# Patient Record
Sex: Male | Born: 1935 | Race: White | Hispanic: No | Marital: Married | Smoking: Former smoker
Health system: Southern US, Community
[De-identification: ages and names within clinical notes are randomized; demographics above are authoritative.]

## PROBLEM LIST (undated history)

## (undated) DIAGNOSIS — N400 Enlarged prostate without lower urinary tract symptoms: Secondary | ICD-10-CM

## (undated) DIAGNOSIS — N189 Chronic kidney disease, unspecified: Secondary | ICD-10-CM

## (undated) DIAGNOSIS — G608 Other hereditary and idiopathic neuropathies: Secondary | ICD-10-CM

## (undated) DIAGNOSIS — L02619 Cutaneous abscess of unspecified foot: Secondary | ICD-10-CM

## (undated) DIAGNOSIS — L89899 Pressure ulcer of other site, unspecified stage: Secondary | ICD-10-CM

## (undated) DIAGNOSIS — I452 Bifascicular block: Secondary | ICD-10-CM

## (undated) DIAGNOSIS — G20 Parkinson's disease: Secondary | ICD-10-CM

## (undated) DIAGNOSIS — I509 Heart failure, unspecified: Secondary | ICD-10-CM

## (undated) DIAGNOSIS — J0100 Acute maxillary sinusitis, unspecified: Secondary | ICD-10-CM

## (undated) DIAGNOSIS — Z79899 Other long term (current) drug therapy: Secondary | ICD-10-CM

## (undated) DIAGNOSIS — L2089 Other atopic dermatitis: Secondary | ICD-10-CM

## (undated) DIAGNOSIS — I2510 Atherosclerotic heart disease of native coronary artery without angina pectoris: Secondary | ICD-10-CM

## (undated) DIAGNOSIS — L97909 Non-pressure chronic ulcer of unspecified part of unspecified lower leg with unspecified severity: Secondary | ICD-10-CM

## (undated) DIAGNOSIS — E119 Type 2 diabetes mellitus without complications: Secondary | ICD-10-CM

## (undated) DIAGNOSIS — L03119 Cellulitis of unspecified part of limb: Secondary | ICD-10-CM

## (undated) DIAGNOSIS — M542 Cervicalgia: Secondary | ICD-10-CM

## (undated) DIAGNOSIS — L219 Seborrheic dermatitis, unspecified: Secondary | ICD-10-CM

## (undated) DIAGNOSIS — G4733 Obstructive sleep apnea (adult) (pediatric): Secondary | ICD-10-CM

## (undated) DIAGNOSIS — G250 Essential tremor: Secondary | ICD-10-CM

## (undated) DIAGNOSIS — R350 Frequency of micturition: Secondary | ICD-10-CM

## (undated) DIAGNOSIS — R609 Edema, unspecified: Secondary | ICD-10-CM

## (undated) DIAGNOSIS — I739 Peripheral vascular disease, unspecified: Secondary | ICD-10-CM

## (undated) DIAGNOSIS — N529 Male erectile dysfunction, unspecified: Secondary | ICD-10-CM

## (undated) DIAGNOSIS — I10 Essential (primary) hypertension: Secondary | ICD-10-CM

## (undated) DIAGNOSIS — I4891 Unspecified atrial fibrillation: Secondary | ICD-10-CM

## (undated) DIAGNOSIS — G252 Other specified forms of tremor: Secondary | ICD-10-CM

## (undated) DIAGNOSIS — I517 Cardiomegaly: Secondary | ICD-10-CM

## (undated) DIAGNOSIS — L97509 Non-pressure chronic ulcer of other part of unspecified foot with unspecified severity: Secondary | ICD-10-CM

## (undated) DIAGNOSIS — J209 Acute bronchitis, unspecified: Secondary | ICD-10-CM

## (undated) DIAGNOSIS — G20A1 Parkinson's disease without dyskinesia, without mention of fluctuations: Secondary | ICD-10-CM

## (undated) DIAGNOSIS — E785 Hyperlipidemia, unspecified: Secondary | ICD-10-CM

## (undated) DIAGNOSIS — R55 Syncope and collapse: Secondary | ICD-10-CM

## (undated) HISTORY — DX: Non-pressure chronic ulcer of unspecified part of unspecified lower leg with unspecified severity: L97.909

## (undated) HISTORY — DX: Acute bronchitis, unspecified: J20.9

## (undated) HISTORY — DX: Type 2 diabetes mellitus without complications: E11.9

## (undated) HISTORY — DX: Obstructive sleep apnea (adult) (pediatric): G47.33

## (undated) HISTORY — DX: Cutaneous abscess of unspecified foot: L02.619

## (undated) HISTORY — DX: Bifascicular block: I45.2

## (undated) HISTORY — DX: Male erectile dysfunction, unspecified: N52.9

## (undated) HISTORY — DX: Non-pressure chronic ulcer of other part of unspecified foot with unspecified severity: L97.509

## (undated) HISTORY — DX: Hyperlipidemia, unspecified: E78.5

## (undated) HISTORY — DX: Seborrheic dermatitis, unspecified: L21.9

## (undated) HISTORY — DX: Other specified forms of tremor: G25.2

## (undated) HISTORY — DX: Cardiomegaly: I51.7

## (undated) HISTORY — DX: Other hereditary and idiopathic neuropathies: G60.8

## (undated) HISTORY — DX: Essential (primary) hypertension: I10

## (undated) HISTORY — DX: Cellulitis of unspecified part of limb: L03.119

## (undated) HISTORY — DX: Acute maxillary sinusitis, unspecified: J01.00

## (undated) HISTORY — DX: Essential tremor: G25.0

## (undated) HISTORY — DX: Edema, unspecified: R60.9

## (undated) HISTORY — DX: Cervicalgia: M54.2

## (undated) HISTORY — DX: Unspecified atrial fibrillation: I48.91

## (undated) HISTORY — DX: Syncope and collapse: R55

## (undated) HISTORY — DX: Parkinson's disease: G20

## (undated) HISTORY — DX: Pressure ulcer of other site, unspecified stage: L89.899

## (undated) HISTORY — DX: Peripheral vascular disease, unspecified: I73.9

## (undated) HISTORY — DX: Other long term (current) drug therapy: Z79.899

## (undated) HISTORY — DX: Chronic kidney disease, unspecified: N18.9

## (undated) HISTORY — DX: Parkinson's disease without dyskinesia, without mention of fluctuations: G20.A1

## (undated) HISTORY — DX: Heart failure, unspecified: I50.9

## (undated) HISTORY — DX: Other atopic dermatitis: L20.89

## (undated) HISTORY — DX: Frequency of micturition: R35.0

## (undated) HISTORY — DX: Atherosclerotic heart disease of native coronary artery without angina pectoris: I25.10

---

## 1968-01-10 HISTORY — PX: LUMBAR SPINE SURGERY: SHX701

## 1972-01-10 HISTORY — PX: KNEE SURGERY: SHX244

## 1979-01-10 HISTORY — PX: TONSILLECTOMY: SUR1361

## 1986-01-09 HISTORY — PX: LUMBAR SPINE SURGERY: SHX701

## 1986-01-09 HISTORY — PX: SHOULDER SURGERY: SHX246

## 1986-01-09 HISTORY — PX: CERVICAL SPINE SURGERY: SHX589

## 1989-01-09 HISTORY — PX: BACK SURGERY: SHX140

## 2002-01-09 HISTORY — PX: CERVICAL DISCECTOMY: SHX98

## 2002-05-27 ENCOUNTER — Encounter: Admission: RE | Admit: 2002-05-27 | Discharge: 2002-05-27 | Payer: Self-pay | Admitting: Neurosurgery

## 2002-05-27 ENCOUNTER — Encounter: Payer: Self-pay | Admitting: Neurosurgery

## 2002-06-11 ENCOUNTER — Encounter: Payer: Self-pay | Admitting: Neurosurgery

## 2002-06-16 ENCOUNTER — Encounter: Payer: Self-pay | Admitting: Neurosurgery

## 2002-06-16 ENCOUNTER — Ambulatory Visit (HOSPITAL_COMMUNITY): Admission: RE | Admit: 2002-06-16 | Discharge: 2002-06-16 | Payer: Self-pay | Admitting: Neurosurgery

## 2002-07-08 ENCOUNTER — Encounter: Payer: Self-pay | Admitting: Neurosurgery

## 2002-07-08 ENCOUNTER — Encounter: Admission: RE | Admit: 2002-07-08 | Discharge: 2002-07-08 | Payer: Self-pay | Admitting: Neurosurgery

## 2006-01-09 HISTORY — PX: CATARACT EXTRACTION, BILATERAL: SHX1313

## 2009-06-08 HISTORY — PX: COLONOSCOPY: SHX174

## 2010-03-21 ENCOUNTER — Emergency Department (HOSPITAL_COMMUNITY): Payer: Medicare Other

## 2010-03-21 ENCOUNTER — Emergency Department (HOSPITAL_COMMUNITY)
Admission: EM | Admit: 2010-03-21 | Discharge: 2010-03-22 | Disposition: A | Discharge status: Home / Self Care | Payer: Medicare Other | Attending: Emergency Medicine | Admitting: Emergency Medicine

## 2010-03-21 DIAGNOSIS — G20 Parkinson's disease: Secondary | ICD-10-CM | POA: Insufficient documentation

## 2010-03-21 DIAGNOSIS — R059 Cough, unspecified: Secondary | ICD-10-CM | POA: Insufficient documentation

## 2010-03-21 DIAGNOSIS — G20A1 Parkinson's disease without dyskinesia, without mention of fluctuations: Secondary | ICD-10-CM | POA: Insufficient documentation

## 2010-03-21 DIAGNOSIS — F29 Unspecified psychosis not due to a substance or known physiological condition: Secondary | ICD-10-CM | POA: Insufficient documentation

## 2010-03-21 DIAGNOSIS — R05 Cough: Secondary | ICD-10-CM | POA: Insufficient documentation

## 2010-03-21 DIAGNOSIS — J189 Pneumonia, unspecified organism: Secondary | ICD-10-CM | POA: Insufficient documentation

## 2010-03-21 DIAGNOSIS — I517 Cardiomegaly: Secondary | ICD-10-CM | POA: Insufficient documentation

## 2010-03-22 ENCOUNTER — Encounter (HOSPITAL_COMMUNITY): Payer: Self-pay

## 2010-03-22 LAB — CBC
HCT: 40 % (ref 39.0–52.0)
Hemoglobin: 12.8 g/dL — ABNORMAL LOW (ref 13.0–17.0)
MCH: 28.4 pg (ref 26.0–34.0)
MCHC: 32 g/dL (ref 30.0–36.0)
RDW: 14.2 % (ref 11.5–15.5)

## 2010-03-22 LAB — BASIC METABOLIC PANEL
BUN: 29 mg/dL — ABNORMAL HIGH (ref 6–23)
CO2: 25 mEq/L (ref 19–32)
Calcium: 8.5 mg/dL (ref 8.4–10.5)
Creatinine, Ser: 1.39 mg/dL (ref 0.4–1.5)
GFR calc non Af Amer: 49 mL/min — ABNORMAL LOW (ref >60)
Glucose, Bld: 125 mg/dL — ABNORMAL HIGH (ref 70–99)
Sodium: 135 mEq/L (ref 135–145)

## 2010-03-22 LAB — DIFFERENTIAL
Basophils Absolute: 0 10*3/uL (ref 0.0–0.1)
Basophils Relative: 0 % (ref 0–1)
Eosinophils Relative: 3 % (ref 0–5)
Lymphocytes Relative: 20 % (ref 12–46)
Monocytes Absolute: 1.1 10*3/uL — ABNORMAL HIGH (ref 0.1–1.0)
Monocytes Relative: 13 % — ABNORMAL HIGH (ref 3–12)
Neutro Abs: 5.7 10*3/uL (ref 1.7–7.7)

## 2010-03-22 LAB — URINALYSIS, ROUTINE W REFLEX MICROSCOPIC
Bilirubin Urine: NEGATIVE
Hgb urine dipstick: NEGATIVE
Ketones, ur: NEGATIVE mg/dL
Nitrite: NEGATIVE
Protein, ur: NEGATIVE mg/dL
Urobilinogen, UA: 1 mg/dL (ref 0.0–1.0)

## 2010-12-19 ENCOUNTER — Ambulatory Visit
Admission: RE | Admit: 2010-12-19 | Discharge: 2010-12-19 | Disposition: A | Discharge status: Home / Self Care | Payer: Medicare Other | Source: Ambulatory Visit | Attending: Internal Medicine | Admitting: Internal Medicine

## 2010-12-19 ENCOUNTER — Other Ambulatory Visit (HOSPITAL_BASED_OUTPATIENT_CLINIC_OR_DEPARTMENT_OTHER): Payer: Self-pay | Admitting: Internal Medicine

## 2010-12-19 DIAGNOSIS — L97509 Non-pressure chronic ulcer of other part of unspecified foot with unspecified severity: Secondary | ICD-10-CM

## 2010-12-22 ENCOUNTER — Encounter (HOSPITAL_BASED_OUTPATIENT_CLINIC_OR_DEPARTMENT_OTHER): Payer: Medicare Other | Attending: Internal Medicine

## 2010-12-22 DIAGNOSIS — Z79899 Other long term (current) drug therapy: Secondary | ICD-10-CM | POA: Insufficient documentation

## 2010-12-22 DIAGNOSIS — Z7982 Long term (current) use of aspirin: Secondary | ICD-10-CM | POA: Insufficient documentation

## 2010-12-22 DIAGNOSIS — R7309 Other abnormal glucose: Secondary | ICD-10-CM | POA: Insufficient documentation

## 2010-12-22 DIAGNOSIS — G20A1 Parkinson's disease without dyskinesia, without mention of fluctuations: Secondary | ICD-10-CM | POA: Insufficient documentation

## 2010-12-22 DIAGNOSIS — L97509 Non-pressure chronic ulcer of other part of unspecified foot with unspecified severity: Secondary | ICD-10-CM | POA: Insufficient documentation

## 2010-12-22 DIAGNOSIS — G20 Parkinson's disease: Secondary | ICD-10-CM | POA: Insufficient documentation

## 2010-12-22 NOTE — H&P (Signed)
NAMEMARVELL, Ryan Cunningham NO.:  [id number]  MEDICAL RECORD NO.:  [id number]  LOCATION:  FOOT                         FACILITY:  [facility]  PHYSICIAN:  Ardath Sax, M.D.     DATE OF BIRTH:  1927/08/29  DATE OF ADMISSION:  12/22/2010 DATE OF DISCHARGE:                             HISTORY & PHYSICAL   [name] is an 77 year old gentleman who [facility]st time, sent here by Dr. Leanord Hawking.  Apparently, he is borderline type 2 diabetic.  He is not on any current medicine.  His blood sugar a week ago was 112 and his A1c was 6.4.  He has Parkinson's and he is on Sinemet 10/100, 3 times a day.  He is on simvastatin, Advil, aspirin and he takes Lasix 20 mg a day.  He has had several back surgeries and he has also had a cardiac cath, which apparently was normal.  He comes here because he has what looks exactly like diabetic foot ulcers, 1 on the plantar aspect of his left foot, opposite his 3rd MP joint.  The other is on the medial side of his foot at the 1st MP joint.  He had these x- rayed as an outpatient, which showed no evidence of an osteo.  He was examined today and he is found to have excellent pedal pulses.  He has got a little swelling of the left leg.  There is no hair, but the foot is warm.  He has an ulcer on the medial side of his MP joint that is dirty with necrotic skin and callus.  It is about a cm and a half in diameter.  He has a cleaner looking up ulcer on the plantar aspect of his foot that is about a half a cm in diameter.  I debrided all of these of callus, and we got a culture and his doctor has also already put him on clindamycin 300 mg 3 times a day.  We will check the culture.  In the meantime, I wrote him a prescription for Santyl and we instructed the wife how to change the dressing every other day.  We will see him back here in a week.  This is a foot that if he could tolerate it with his Parkinson's and his weakness, a  total-contact cast may be of value to help heal these wounds.     Ardath Sax, M.D.     PP/MEDQ  D:  12/22/2010  T:  12/22/2010  Job:  [id number]

## 2011-01-12 ENCOUNTER — Encounter (HOSPITAL_BASED_OUTPATIENT_CLINIC_OR_DEPARTMENT_OTHER): Payer: Medicare Other | Attending: Internal Medicine

## 2011-01-12 DIAGNOSIS — L84 Corns and callosities: Secondary | ICD-10-CM | POA: Insufficient documentation

## 2011-01-12 DIAGNOSIS — G20A1 Parkinson's disease without dyskinesia, without mention of fluctuations: Secondary | ICD-10-CM | POA: Insufficient documentation

## 2011-01-12 DIAGNOSIS — E119 Type 2 diabetes mellitus without complications: Secondary | ICD-10-CM | POA: Insufficient documentation

## 2011-01-12 DIAGNOSIS — L97509 Non-pressure chronic ulcer of other part of unspecified foot with unspecified severity: Secondary | ICD-10-CM | POA: Insufficient documentation

## 2011-01-12 DIAGNOSIS — Z79899 Other long term (current) drug therapy: Secondary | ICD-10-CM | POA: Insufficient documentation

## 2011-01-12 DIAGNOSIS — G20 Parkinson's disease: Secondary | ICD-10-CM | POA: Insufficient documentation

## 2011-01-19 ENCOUNTER — Encounter (HOSPITAL_BASED_OUTPATIENT_CLINIC_OR_DEPARTMENT_OTHER): Payer: Medicare Other

## 2011-02-16 ENCOUNTER — Encounter (HOSPITAL_BASED_OUTPATIENT_CLINIC_OR_DEPARTMENT_OTHER): Payer: Medicare Other | Attending: Internal Medicine

## 2011-02-16 DIAGNOSIS — L97509 Non-pressure chronic ulcer of other part of unspecified foot with unspecified severity: Secondary | ICD-10-CM | POA: Insufficient documentation

## 2011-03-16 ENCOUNTER — Encounter (HOSPITAL_BASED_OUTPATIENT_CLINIC_OR_DEPARTMENT_OTHER): Payer: Medicare Other | Attending: Internal Medicine

## 2011-03-16 DIAGNOSIS — L97509 Non-pressure chronic ulcer of other part of unspecified foot with unspecified severity: Secondary | ICD-10-CM | POA: Insufficient documentation

## 2012-01-04 ENCOUNTER — Encounter: Payer: Self-pay | Admitting: Geriatric Medicine

## 2012-03-26 ENCOUNTER — Ambulatory Visit: Payer: Self-pay | Admitting: Internal Medicine

## 2012-04-03 ENCOUNTER — Ambulatory Visit (INDEPENDENT_AMBULATORY_CARE_PROVIDER_SITE_OTHER): Payer: Medicare Other | Admitting: Internal Medicine

## 2012-04-03 ENCOUNTER — Encounter: Payer: Self-pay | Admitting: Internal Medicine

## 2012-04-03 VITALS — BP 138/64 | HR 62 | Temp 97.4°F | Resp 20 | Ht 73.5 in | Wt 216.0 lb

## 2012-04-03 DIAGNOSIS — G20 Parkinson's disease: Secondary | ICD-10-CM

## 2012-04-03 DIAGNOSIS — I10 Essential (primary) hypertension: Secondary | ICD-10-CM

## 2012-04-03 DIAGNOSIS — R001 Bradycardia, unspecified: Secondary | ICD-10-CM | POA: Insufficient documentation

## 2012-04-03 DIAGNOSIS — N189 Chronic kidney disease, unspecified: Secondary | ICD-10-CM

## 2012-04-03 DIAGNOSIS — I498 Other specified cardiac arrhythmias: Secondary | ICD-10-CM

## 2012-04-03 DIAGNOSIS — E785 Hyperlipidemia, unspecified: Secondary | ICD-10-CM | POA: Insufficient documentation

## 2012-04-03 NOTE — Progress Notes (Signed)
  Subjective:    Patient ID: Ryan Cunningham, male    DOB: 05-16-1927, 79 y.o.   MRN: [id number]  Chief Complaint  Patient presents with  . Medical Managment of Chronic Issues  . Tremors   HPI pt seeing dr Leanord Hawking before. Here for his routine follow up. He denies any complaints this visit. No recent falls reported  Tremors under control with current regimen bp remains stable    Review of Systems  Constitutional: Negative for chills and appetite change.  Respiratory: Negative for cough and shortness of breath.   Cardiovascular: Negative for chest pain, palpitations and leg swelling.  Gastrointestinal: Negative for abdominal pain, constipation and abdominal distention.  Genitourinary: Negative for dysuria.  Musculoskeletal: Positive for gait problem.  Neurological: Positive for tremors. Negative for dizziness, syncope and speech difficulty.  Psychiatric/Behavioral: Negative for behavioral problems.       Objective:   Physical Exam  Constitutional: He is oriented to person, place, and time. He appears well-developed and well-nourished. No distress.  HENT:  Head: Normocephalic.  Mouth/Throat: Oropharynx is clear and moist.  Eyes: Conjunctivae and EOM are normal. Pupils are equal, round, and reactive to light.  Neck: Normal range of motion. Neck supple.  Cardiovascular: Normal rate and regular rhythm.   Pulmonary/Chest: Effort normal and breath sounds normal.  Abdominal: Soft. Bowel sounds are normal.  Musculoskeletal: Normal range of motion. He exhibits no edema.  Neurological: He is alert and oriented to person, place, and time.  Skin: Skin is warm and dry.  Venous stasis present  Psychiatric: He has a normal mood and affect.          Assessment & Plan:   HTN (hypertension) bp acceptable this visit. Monitor for now. Currently off all medications.   Parkinson disease On carbidopa-levodopa 10-100 mg 2 tab tid- stable, shuffled gait- using cane. Fall precautions  CKD  (chronic kidney disease) Check renal function prior to next visit  Bradycardia Persists, asymptomatic. Echocardiogram shows good LV function  Other and unspecified hyperlipidemia Continue zocor 40 mg daily and monitor, check flp prior to next visit. Also to continue ASA

## 2012-04-03 NOTE — Assessment & Plan Note (Signed)
On carbidopa-levodopa 10-100 mg 2 tab tid- stable, shuffled gait- using cane. Fall precautions

## 2012-04-03 NOTE — Assessment & Plan Note (Signed)
Check renal function prior to next visit

## 2012-04-03 NOTE — Assessment & Plan Note (Signed)
Persists, asymptomatic. Echocardiogram shows good LV function

## 2012-04-03 NOTE — Assessment & Plan Note (Addendum)
Continue zocor 40 mg daily and monitor, check flp prior to next visit. Also to continue ASA

## 2012-04-03 NOTE — Assessment & Plan Note (Signed)
bp acceptable this visit. Monitor for now. Currently off all medications.

## 2012-07-26 ENCOUNTER — Other Ambulatory Visit: Payer: Medicare Other

## 2012-07-26 ENCOUNTER — Other Ambulatory Visit: Payer: Self-pay | Admitting: *Deleted

## 2012-07-26 DIAGNOSIS — N189 Chronic kidney disease, unspecified: Secondary | ICD-10-CM

## 2012-07-26 DIAGNOSIS — I10 Essential (primary) hypertension: Secondary | ICD-10-CM

## 2012-07-26 DIAGNOSIS — E785 Hyperlipidemia, unspecified: Secondary | ICD-10-CM

## 2012-07-26 DIAGNOSIS — IMO0001 Reserved for inherently not codable concepts without codable children: Secondary | ICD-10-CM

## 2012-07-27 LAB — COMPREHENSIVE METABOLIC PANEL
AST: 29 IU/L (ref 0–40)
Albumin: 4.3 g/dL (ref 3.5–4.7)
Alkaline Phosphatase: 83 IU/L (ref 39–117)
BUN/Creatinine Ratio: 24 — ABNORMAL HIGH (ref 10–22)
BUN: 33 mg/dL — ABNORMAL HIGH (ref 8–27)
CO2: 25 mmol/L (ref 18–29)
Chloride: 102 mmol/L (ref 97–108)
GFR calc Af Amer: 55 mL/min/{1.73_m2} — ABNORMAL LOW (ref >59)
Potassium: 5.2 mmol/L (ref 3.5–5.2)
Sodium: 140 mmol/L (ref 134–144)
Total Bilirubin: 0.8 mg/dL (ref 0.0–1.2)

## 2012-07-27 LAB — HEMOGLOBIN A1C
Est. average glucose Bld gHb Est-mCnc: 134 mg/dL
Hgb A1c MFr Bld: 6.3 % — ABNORMAL HIGH (ref 4.8–5.6)

## 2012-07-27 LAB — CBC WITH DIFFERENTIAL/PLATELET
Basos: 1 % (ref 0–3)
Eosinophils Absolute: 0.1 10*3/uL (ref 0.0–0.4)
HCT: 41.4 % (ref 37.5–51.0)
Hemoglobin: 13.7 g/dL (ref 12.6–17.7)
Lymphocytes Absolute: 1.8 10*3/uL (ref 0.7–3.1)
Lymphs: 27 % (ref 14–46)
MCH: 29.2 pg (ref 26.6–33.0)
Monocytes: 7 % (ref 4–12)
Neutrophils Absolute: 4.2 10*3/uL (ref 1.4–7.0)
RBC: 4.69 x10E6/uL (ref 4.14–5.80)

## 2012-07-27 LAB — LIPID PANEL
Cholesterol, Total: 119 mg/dL (ref 100–199)
LDL Calculated: 61 mg/dL (ref 0–99)
Triglycerides: 79 mg/dL (ref 0–149)

## 2012-07-30 ENCOUNTER — Ambulatory Visit (INDEPENDENT_AMBULATORY_CARE_PROVIDER_SITE_OTHER): Payer: Medicare Other | Admitting: Internal Medicine

## 2012-07-30 ENCOUNTER — Encounter: Payer: Self-pay | Admitting: Internal Medicine

## 2012-07-30 VITALS — BP 122/70 | HR 51 | Temp 97.7°F | Resp 13 | Ht 73.5 in | Wt 208.4 lb

## 2012-07-30 DIAGNOSIS — I10 Essential (primary) hypertension: Secondary | ICD-10-CM

## 2012-07-30 DIAGNOSIS — R7309 Other abnormal glucose: Secondary | ICD-10-CM

## 2012-07-30 DIAGNOSIS — R001 Bradycardia, unspecified: Secondary | ICD-10-CM

## 2012-07-30 DIAGNOSIS — N183 Chronic kidney disease, stage 3 (moderate): Secondary | ICD-10-CM

## 2012-07-30 DIAGNOSIS — I498 Other specified cardiac arrhythmias: Secondary | ICD-10-CM

## 2012-07-30 DIAGNOSIS — G20 Parkinson's disease: Secondary | ICD-10-CM

## 2012-07-30 DIAGNOSIS — R7303 Prediabetes: Secondary | ICD-10-CM

## 2012-07-30 DIAGNOSIS — E785 Hyperlipidemia, unspecified: Secondary | ICD-10-CM

## 2012-07-30 MED ORDER — CARBIDOPA-LEVODOPA 10-100 MG PO TABS: 2.0000 | ORAL_TABLET | Freq: Three times a day (TID) | ORAL | Status: DC

## 2012-07-30 MED ORDER — SIMVASTATIN 80 MG PO TABS: ORAL_TABLET | ORAL | Status: DC

## 2012-07-30 NOTE — Progress Notes (Signed)
Patient ID: Ryan Cunningham, male   DOB: September 26, 1927, 79 y.o.   MRN: [id number]  Chief Complaint  Patient presents with  . Medical Managment of Chronic Issues   HPI  Here for his routine follow up. He denies any complaints this visit. No recent falls reported. Using his cane Tremors under control with current regimen bp remains stable  Review of Systems  Constitutional: Negative for chills, fever and appetite change. Has hearing aid Respiratory: Negative for cough and shortness of breath.   Cardiovascular: Negative for chest pain, palpitations and leg swelling.  Gastrointestinal: Negative for abdominal pain, constipation and abdominal distention.  Genitourinary: Negative for dysuria.  Musculoskeletal: Positive for gait problem. Uses a cane Neurological: Positive for tremors. Negative for dizziness, syncope and speech difficulty.  Psychiatric/Behavioral: Negative for behavioral problems.   Allergies  Allergen Reactions  . Penicillins    Reviewed medications  PHYSICAL EXAM  BP 122/70  Pulse 51  Temp(Src) 97.7 F (36.5 C) (Oral)  Resp 13  Ht 6' 1.5" (1.867 m)  Wt 208 lb 6.4 oz (94.53 kg)  BMI 27.12 kg/m2  Constitutional: He is oriented to person, place, and time. He appears well-developed and well-nourished. No distress.  HENT:   Head: Normocephalic.   Mouth/Throat: Oropharynx is clear and moist.  Eyes: Conjunctivae and EOM are normal. Pupils are equal, round, and reactive to light.  Neck: Normal range of motion. Neck supple.  Cardiovascular: Normal rate and regular rhythm.   Pulmonary/Chest: Effort normal and breath sounds normal.  Abdominal: Soft. Bowel sounds are normal.  Musculoskeletal: Normal range of motion. He exhibits no edema.  Neurological: He is alert and oriented to person, place, and time.  Skin: Skin is warm and dry.  Venous stasis present  Psychiatric: He has a normal mood and affect.   LABS REVIEWED  CBC    Component Value Date/Time   WBC 6.6  07/26/2012 0846   WBC 8.9 03/21/2010 2358   RBC 4.69 07/26/2012 0846   RBC 4.50 03/21/2010 2358   HGB 13.7 07/26/2012 0846   HCT 41.4 07/26/2012 0846   PLT 281 03/21/2010 2358   MCV 88 07/26/2012 0846   MCH 29.2 07/26/2012 0846   MCH 28.4 03/21/2010 2358   MCHC 33.1 07/26/2012 0846   MCHC 32.0 03/21/2010 2358   RDW 14.4 07/26/2012 0846   RDW 14.2 03/21/2010 2358   LYMPHSABS 1.8 07/26/2012 0846   LYMPHSABS 1.8 03/21/2010 2358   MONOABS 1.1* 03/21/2010 2358   EOSABS 0.1 07/26/2012 0846   EOSABS 0.3 03/21/2010 2358   BASOSABS 0.1 07/26/2012 0846   BASOSABS 0.0 03/21/2010 2358    CMP     Component Value Date/Time   NA 140 07/26/2012 0846   NA 135 03/21/2010 2358   K 5.2 07/26/2012 0846   CL 102 07/26/2012 0846   CO2 25 07/26/2012 0846   GLUCOSE 118* 07/26/2012 0846   GLUCOSE 125* 03/21/2010 2358   BUN 33* 07/26/2012 0846   BUN 29* 03/21/2010 2358   CREATININE 1.36* 07/26/2012 0846   CALCIUM 9.7 07/26/2012 0846   PROT 7.5 07/26/2012 0846   AST 29 07/26/2012 0846   ALT 30 07/26/2012 0846   ALKPHOS 83 07/26/2012 0846   BILITOT 0.8 07/26/2012 0846   GFRNONAA 48* 07/26/2012 0846   GFRAA 55* 07/26/2012 0846   Lipid Panel     Component Value Date/Time   TRIG 79 07/26/2012 0846   HDL 42 07/26/2012 0846   CHOLHDL 2.8 07/26/2012 0846   LDLCALC 61 07/26/2012  2841   a1c 6.3  ASSESSMENT/PLAN-  HTN (hypertension) bp normal this visit. Monitor for now. Currently off all medications.   Other and unspecified hyperlipidemia Continue zocor 40 mg daily and continue ASA. Reviewed flp  Parkinson disease On carbidopa-levodopa 10-100 mg 2 tab tid- stable, shuffled gait- using cane. Fall precautions. Refills provided  CKD (chronic kidney disease) gfr of 48 s/o ckd stage 3. Avoid NSAIDs  Bradycardia Persists, asymptomatic. Echocardiogram shows good LV function  Prediabetes on asa and statin. bp controlled. Monitor clinically for now

## 2012-08-20 ENCOUNTER — Encounter: Payer: Self-pay | Admitting: Nurse Practitioner

## 2012-08-20 ENCOUNTER — Ambulatory Visit (INDEPENDENT_AMBULATORY_CARE_PROVIDER_SITE_OTHER): Payer: Medicare Other | Admitting: Nurse Practitioner

## 2012-08-20 VITALS — BP 100/58 | HR 86 | Temp 97.6°F | Resp 18 | Wt 206.9 lb

## 2012-08-20 DIAGNOSIS — T7840XA Allergy, unspecified, initial encounter: Secondary | ICD-10-CM

## 2012-08-20 MED ORDER — HYDROXYZINE HCL 25 MG PO TABS: 25.0000 mg | ORAL_TABLET | Freq: Three times a day (TID) | ORAL | Status: DC | PRN

## 2012-08-20 MED ORDER — PREDNISONE (PAK) 10 MG PO TABS: ORAL_TABLET | ORAL | Status: DC

## 2012-08-20 NOTE — Progress Notes (Signed)
Patient ID: Ryan Cunningham, male   DOB: 14-Nov-1927, 79 y.o.   MRN: [id number]   Allergies  Allergen Reactions  . Penicillins     Chief Complaint  Patient presents with  . Acute Visit    woke up with face swelling on the left side, rash on arms and hands w/itching    HPI: Patient is a 79 y.o. male seen in the office today for a reaction to possible wasp Yesterday he was outside picking up a bird house and a swarm of wasp came out of it. Pt was trying to kill them with a spray. He was fine when he went to bed but when he woke up this morning his left side of face is swollen, rash on both arms, both legs, chest and back. Reports it is itching.  Woke up at 4 am and could not close his mouth and had trouble catching his breath due to the shock of all the swelling (this resolved quickly).  No shortness of breath or trouble breathing, no trouble swallowing. No swelling of tongue or inside mouth Review of Systems:   Review of Systems  Constitutional: Negative for fever, chills and malaise/fatigue.  Respiratory: Negative for cough, shortness of breath and wheezing.   Cardiovascular: Negative for chest pain and palpitations.  Gastrointestinal: Negative for abdominal pain, diarrhea and constipation.  Genitourinary: Negative for dysuria, urgency and frequency.  Musculoskeletal: Negative for myalgias.  Skin: Positive for itching and rash.  Neurological: Negative for dizziness, weakness and headaches.      Past Medical History  Diagnosis Date  . Other specified idiopathic peripheral neuropathy   . Atrial fibrillation   . Congestive heart failure, unspecified   . Ulcer of lower limb, unspecified   . Ulcer of other part of foot   . Cellulitis and abscess of foot, except toes   . Pressure ulcer, other site(707.09)   . Acute bronchitis   . Acute bronchitis   . Obstructive sleep apnea (adult) (pediatric)   . Edema   . Acute maxillary sinusitis   . Chronic kidney disease, unspecified   .  Urinary frequency   . Other atopic dermatitis and related conditions   . Syncope and collapse   . Encounter for long-term (current) use of other medications   . Other and unspecified hyperlipidemia   . Essential and other specified forms of tremor   . Coronary atherosclerosis of unspecified type of vessel, native or graft   . Cervicalgia   . Type II or unspecified type diabetes mellitus without mention of complication, not stated as uncontrolled   . Unspecified essential hypertension   . Seborrhea   . Paralysis agitans   . Right bundle branch block and left anterior fascicular block   . Cardiomegaly   . Peripheral vascular disease, unspecified   . Impotence of organic origin    History reviewed. No pertinent past surgical history. Social History:   reports that he has quit smoking. His smoking use included Cigarettes. He smoked 0.00 packs per day. He does not have any smokeless tobacco history on file. He reports that he does not drink alcohol or use illicit drugs.  Family History  Problem Relation Age of Onset  . Heart disease Father     CVA  . Heart disease Brother   . Heart disease Brother   . Heart disease Brother     MI  . Cancer Brother     Medications: Patient's Medications  New Prescriptions   No medications on  file  Previous Medications   ASPIRIN 81 MG TABLET    Take 81 mg by mouth daily.   CARBIDOPA-LEVODOPA (SINEMET IR) 10-100 MG PER TABLET    Take 2 tablets by mouth 3 (three) times daily. To help tremor.   SIMVASTATIN (ZOCOR) 80 MG TABLET    Take one half tablet by mouth daily to lower cholesterol.  Modified Medications   No medications on file  Discontinued Medications   No medications on file     Physical Exam:  Filed Vitals:   08/20/12 1002  BP: 100/58  Pulse: 86  Temp: 97.6 F (36.4 C)  TempSrc: Oral  Resp: 18  Weight: 206 lb 14.4 oz (93.849 kg)  SpO2: 95%    Physical Exam  Constitutional: He is oriented to person, place, and time and  well-developed, well-nourished, and in no distress. No distress.  HENT:  Head: Normocephalic and atraumatic.  Nose: Nose normal.  Mouth/Throat: Oropharynx is clear and moist. No oropharyngeal exudate, posterior oropharyngeal edema or posterior oropharyngeal erythema.  Pulmonary/Chest: Effort normal and breath sounds normal. No respiratory distress. He has no wheezes.  Musculoskeletal: Normal range of motion. He exhibits edema. He exhibits no tenderness.  Neurological: He is alert and oriented to person, place, and time.  Skin: Skin is warm and dry. Rash noted. Rash is urticarial (to bilateral arms and legs). He is not diaphoretic.  Facial swelling to the left eye and left side of lips     Labs reviewed: Basic Metabolic Panel:  Recent Labs  16/10/96 0846  NA 140  K 5.2  CL 102  CO2 25  GLUCOSE 118*  BUN 33*  CREATININE 1.36*  CALCIUM 9.7   Liver Function Tests:  Recent Labs  07/26/12 0846  AST 29  ALT 30  ALKPHOS 83  BILITOT 0.8  PROT 7.5   No results found for this basename: LIPASE, AMYLASE,  in the last 8760 hours No results found for this basename: AMMONIA,  in the last 8760 hours CBC:  Recent Labs  07/26/12 0846  WBC 6.6  NEUTROABS 4.2  HGB 13.7  HCT 41.4  MCV 88   Lipid Panel:  Recent Labs  07/26/12 0846  HDL 42  LDLCALC 61  TRIG 79  CHOLHDL 2.8      Assessment/Plan 1. Allergic reaction, initial encounter Prednisone prescription sent to pharmacy- go now and take 1st dose today You will take 6 of the 10 mg tablets for 7 days Take Zantac 75 mg take 2 tablets daily while taking prednisone - predniSONE (STERAPRED UNI-PAK) 10 MG tablet; Take 6 tablets for 7 days - hydrOXYzine (ATARAX/VISTARIL) 25 MG tablet; Take 1 tablet (25 mg total) by mouth 3 (three) times daily as needed for itching.  Dispense: 30 tablet; Refill: 0 Education given to seek immediate medical attention (call 911) if unable to swallow or breathing issues occur- pt and wife  understand this is an emergency

## 2012-08-20 NOTE — Patient Instructions (Addendum)
Prednisone prescription sent to pharmacy- go now and take 1st dose today You will take 6 of the 10 mg tablets for 7 days   Take Zantac 75 mg take 2 tablets daily while taking prednisone   Allergic Reaction, Mild to Moderate Allergies may happen from anything your body is sensitive to. This may be food, medications, pollens, chemicals, and nearly anything around you in everyday life that produces allergens. An allergen is anything that causes an allergy producing substance. Allergens cause your body to release allergic antibodies. Through a chain of events, they cause a release of histamine into the blood stream. Histamines are meant to protect you, but they also cause your discomfort. This is why antihistamines are often used for allergies. Heredity is often a factor in causing allergic reactions. This means you may have some of the same allergies as your parents. Allergies happen in all age groups. You may have some idea of what caused your reaction. There are many allergens around [place]. It may be difficult to know what caused your reaction. If this is a first time event, it may never happen again. Allergies cannot be cured but can be controlled with medications. SYMPTOMS  You may get some or all of the following problems from allergies.  Swelling and itching in and around the mouth.   Tearing, itchy eyes.   Nasal congestion and runny nose.   Sneezing and coughing.   An itchy red rash or hives.   Vomiting or diarrhea.   Difficulty breathing.  Seasonal allergies occur in all age groups. They are seasonal because they usually occur during the same season every year. They may be a reaction to molds, grass pollens, or tree pollens. Other causes of allergies are house dust mite allergens, pet dander and mold spores. These are just a common few of the thousands of allergens around [place]. All of the symptoms listed above happen when you come in contact with pollens and other allergens. Seasonal  allergies are usually not life threatening. They are generally more of a nuisance that can often be handled using medications. Hay fever is a combination of all or some of the above listed allergy problems. It may often be treated with simple over-the-counter medications such as diphenhydramine. Take medication as directed. Check with your caregiver or package insert for child dosages. TREATMENT AND HOME CARE INSTRUCTIONS If hives or rash are present:  Take medications as directed.   You may use an over-the-counter antihistamine (diphenhydramine) for hives and itching as needed. Do not drive or drink alcohol until medications used to treat the reaction have worn off. Antihistamines tend to make people sleepy.   Apply cold cloths (compresses) to the skin or take baths in cool water. This will help itching. Avoid hot baths or showers. Heat will make a rash and itching worse.   If your allergies persist and become more severe, and over the counter medications are not effective, there are many new medications your caretaker can prescribe. Immunotherapy or desensitizing injections can be used if all else fails. Follow up with your caregiver if problems continue.  SEEK MEDICAL CARE IF:   Your allergies are becoming progressively more troublesome.   You suspect a food allergy. Symptoms generally happen within 30 minutes of eating a food.   Your symptoms have not gone away within 2 days or are getting worse.   You develop new symptoms.   You want to retest yourself or your child with a food or drink you think causes  an allergic reaction. Never test yourself or your child of a suspected allergy without being under the watchful eye of your caregivers. A second exposure to an allergen may be life-threatening.  SEEK IMMEDIATE MEDICAL CARE IF:  You develop difficulty breathing or wheezing, or have a tight feeling in your chest or throat.   You develop a swollen mouth, hives, swelling, or itching all  over your body.  A severe reaction with any of the above problems should be considered life-threatening. If you suddenly develop difficulty breathing call for local emergency medical help. THIS IS AN EMERGENCY. MAKE SURE YOU:   Understand these instructions.   Will watch your condition.   Will get help right away if you are not doing well or get worse.  Document Released: 10/23/2006 Document Revised: 12/15/2010 Document Reviewed: 10/23/2006 [employer] Patient Information 2012 [place], [place].

## 2012-09-10 NOTE — Progress Notes (Signed)
Reviewed with patient in OV

## 2013-01-20 ENCOUNTER — Other Ambulatory Visit: Payer: Medicare Other

## 2013-01-20 DIAGNOSIS — E785 Hyperlipidemia, unspecified: Secondary | ICD-10-CM

## 2013-01-21 LAB — COMPREHENSIVE METABOLIC PANEL
A/G RATIO: 1.8 (ref 1.1–2.5)
ALK PHOS: 76 IU/L (ref 39–117)
ALT: 21 IU/L (ref 0–44)
AST: 18 IU/L (ref 0–40)
Albumin: 4.5 g/dL (ref 3.5–4.7)
BILIRUBIN TOTAL: 0.9 mg/dL (ref 0.0–1.2)
BUN / CREAT RATIO: 22 (ref 10–22)
BUN: 26 mg/dL (ref 8–27)
CHLORIDE: 102 mmol/L (ref 97–108)
CO2: 22 mmol/L (ref 18–29)
Calcium: 8.9 mg/dL (ref 8.6–10.2)
Creatinine, Ser: 1.2 mg/dL (ref 0.76–1.27)
GFR calc non Af Amer: 56 mL/min/{1.73_m2} — ABNORMAL LOW (ref >59)
GFR, EST AFRICAN AMERICAN: 64 mL/min/{1.73_m2} (ref >59)
GLUCOSE: 126 mg/dL — AB (ref 65–99)
Globulin, Total: 2.5 g/dL (ref 1.5–4.5)
POTASSIUM: 4.7 mmol/L (ref 3.5–5.2)
SODIUM: 142 mmol/L (ref 134–144)
TOTAL PROTEIN: 7 g/dL (ref 6.0–8.5)

## 2013-01-22 ENCOUNTER — Ambulatory Visit (INDEPENDENT_AMBULATORY_CARE_PROVIDER_SITE_OTHER): Payer: Medicare Other | Admitting: Internal Medicine

## 2013-01-22 VITALS — BP 146/72 | HR 69 | Temp 98.1°F | Resp 10 | Wt 211.0 lb

## 2013-01-22 DIAGNOSIS — G20 Parkinson's disease: Secondary | ICD-10-CM

## 2013-01-22 DIAGNOSIS — I4891 Unspecified atrial fibrillation: Secondary | ICD-10-CM

## 2013-01-22 DIAGNOSIS — G4733 Obstructive sleep apnea (adult) (pediatric): Secondary | ICD-10-CM

## 2013-01-22 DIAGNOSIS — E785 Hyperlipidemia, unspecified: Secondary | ICD-10-CM

## 2013-01-22 DIAGNOSIS — R7303 Prediabetes: Secondary | ICD-10-CM

## 2013-01-22 DIAGNOSIS — R7309 Other abnormal glucose: Secondary | ICD-10-CM

## 2013-01-22 DIAGNOSIS — I4510 Unspecified right bundle-branch block: Secondary | ICD-10-CM | POA: Insufficient documentation

## 2013-01-22 DIAGNOSIS — I10 Essential (primary) hypertension: Secondary | ICD-10-CM

## 2013-01-22 MED ORDER — ASCRIPTIN 325 MG PO TABS: 1.0000 | ORAL_TABLET | Freq: Every day | ORAL | Status: DC

## 2013-01-22 NOTE — Progress Notes (Signed)
Patient ID: Ryan Cunningham, male   DOB: 09-Jul-1927, 80 y.o.   MRN: [id number]    Chief Complaint  Patient presents with  . Medical Managment of Chronic Issues    6 month follow-up   . Fatigue   Allergies  Allergen Reactions  . Penicillins    HPI   80 y/o male patient is here for follow up. He complaints of feeling excessively tired. He has problem staying awake during day time. He is sleeping early at night but does not feel fresh in the morning. His wife has told him that he has interrupted sleep at night, talks in his sleep. He has history of OSA and has refused work up in the past  Review of Systems   Constitutional: Negative for chills, fever and appetite change. Has hearing aid Respiratory: Negative for cough and shortness of breath.    Cardiovascular: Negative for chest pain, palpitations and leg swelling.   Gastrointestinal: Negative for abdominal pain, constipation and abdominal distention.   Genitourinary: Negative for dysuria. Has increased urinary frequency during the day Musculoskeletal: Positive for gait problem. Uses a cane. No falls reported Neurological: Positive for tremors. Negative for dizziness, syncope and speech difficulty.  Psychiatric/Behavioral: Negative for behavioral problems.   Past Medical History  Diagnosis Date  . Other specified idiopathic peripheral neuropathy   . Atrial fibrillation   . Congestive heart failure, unspecified   . Ulcer of lower limb, unspecified   . Ulcer of other part of foot   . Cellulitis and abscess of foot, except toes   . Pressure ulcer, other site(707.09)   . Acute bronchitis   . Acute bronchitis   . Obstructive sleep apnea (adult) (pediatric)   . Edema   . Acute maxillary sinusitis   . Chronic kidney disease, unspecified   . Urinary frequency   . Other atopic dermatitis and related conditions   . Syncope and collapse   . Encounter for long-term (current) use of other medications   . Other and unspecified  hyperlipidemia   . Essential and other specified forms of tremor   . Coronary atherosclerosis of unspecified type of vessel, native or graft   . Cervicalgia   . Type II or unspecified type diabetes mellitus without mention of complication, not stated as uncontrolled   . Unspecified essential hypertension   . Seborrhea   . Paralysis agitans   . Right bundle branch block and left anterior fascicular block   . Cardiomegaly   . Peripheral vascular disease, unspecified   . Impotence of organic origin    Medication reviewed. See [facility]  Physical exam BP 146/72  Pulse 69  Temp(Src) 98.1 F (36.7 C) (Oral)  Resp 10  Wt 211 lb (95.709 kg)  SpO2 94%  Constitutional: He is oriented to person, place, and time. He appears well-developed and well-nourished. No distress.   HENT:   Head: Normocephalic.   Mouth/Throat: Oropharynx is clear and moist.   Eyes: Conjunctivae and EOM are normal. Pupils are equal, round, and reactive to light.   Neck: Normal range of motion. Neck supple.   Cardiovascular: irregular rate. No murmurs/ rubs Pulmonary/Chest: Effort normal and breath sounds normal. No wheeze/ rhonchi/ crackles Abdominal: Soft. Bowel sounds are normal.  Musculoskeletal: Normal range of motion. He exhibits no edema.  Neurological: He is alert and oriented to person, place, and time.   Skin: Skin is warm and dry. Venous stasis present Psychiatric: He has a normal mood and affect.  Labs- CBC  Component Value Date/Time   WBC 6.6 07/26/2012 0846   WBC 8.9 03/21/2010 2358   RBC 4.69 07/26/2012 0846   RBC 4.50 03/21/2010 2358   HGB 13.7 07/26/2012 0846   HCT 41.4 07/26/2012 0846   PLT 281 03/21/2010 2358   MCV 88 07/26/2012 0846   MCH 29.2 07/26/2012 0846   MCH 28.4 03/21/2010 2358   MCHC 33.1 07/26/2012 0846   MCHC 32.0 03/21/2010 2358   RDW 14.4 07/26/2012 0846   RDW 14.2 03/21/2010 2358   LYMPHSABS 1.8 07/26/2012 0846   LYMPHSABS 1.8 03/21/2010 2358   MONOABS 1.1* 03/21/2010 2358   EOSABS 0.1  07/26/2012 0846   EOSABS 0.3 03/21/2010 2358   BASOSABS 0.1 07/26/2012 0846   BASOSABS 0.0 03/21/2010 2358    CMP     Component Value Date/Time   NA 142 01/20/2013 0828   NA 135 03/21/2010 2358   K 4.7 01/20/2013 0828   CL 102 01/20/2013 0828   CO2 22 01/20/2013 0828   GLUCOSE 126* 01/20/2013 0828   GLUCOSE 125* 03/21/2010 2358   BUN 26 01/20/2013 0828   BUN 29* 03/21/2010 2358   CREATININE 1.20 01/20/2013 0828   CALCIUM 8.9 01/20/2013 0828   PROT 7.0 01/20/2013 0828   AST 18 01/20/2013 0828   ALT 21 01/20/2013 0828   ALKPHOS 76 01/20/2013 0828   BILITOT 0.9 01/20/2013 0828   GFRNONAA 56* 01/20/2013 0828   GFRAA 64 01/20/2013 0828   ekg 7/13 afib, RBBB, left anterior fascicular block  Echocardiogram: 07/17/11- LV cavity size normal. EF 555. Mildly dilated left atrial cavity. Trace AR. Mild MR. Mild pulmonary hypertension  Assessment/plan  1. Parkinson disease Continue sinemet tid, monitor clinically. Check mmse next visit  2. Other and unspecified hyperlipidemia Continue his zocor. - Lipid Panel; Future  3. Prediabetes Check a1c next visit. Reviewed his labs - Hemoglobin A1c; Future  4. HTN (hypertension) bp is stable. Off all medications at present. Monitor clinically - CBC with Differential; Future - Basic Metabolic Panel; Future  5. OSA (obstructive sleep apnea) Refuses workup. Encouraged to get his sleep study and be on CPAP as that would also help with his pulmonary hypertension  6. A-fib Rate controlled. Off all medication due to bradycardia in past. Continue aspirin and statin

## 2013-02-07 ENCOUNTER — Encounter: Payer: Self-pay | Admitting: Internal Medicine

## 2013-07-17 ENCOUNTER — Other Ambulatory Visit: Payer: Medicare Other

## 2013-07-17 DIAGNOSIS — E785 Hyperlipidemia, unspecified: Secondary | ICD-10-CM

## 2013-07-17 DIAGNOSIS — R7303 Prediabetes: Secondary | ICD-10-CM

## 2013-07-17 DIAGNOSIS — I10 Essential (primary) hypertension: Secondary | ICD-10-CM

## 2013-07-18 LAB — CBC WITH DIFFERENTIAL/PLATELET
BASOS: 1 %
Basophils Absolute: 0.1 10*3/uL (ref 0.0–0.2)
EOS ABS: 0.1 10*3/uL (ref 0.0–0.4)
EOS: 2 %
HCT: 39.9 % (ref 37.5–51.0)
HEMOGLOBIN: 13.7 g/dL (ref 12.6–17.7)
IMMATURE GRANS (ABS): 0 10*3/uL (ref 0.0–0.1)
Immature Granulocytes: 0 %
Lymphocytes Absolute: 1.8 10*3/uL (ref 0.7–3.1)
Lymphs: 27 %
MCH: 29.7 pg (ref 26.6–33.0)
MCHC: 34.3 g/dL (ref 31.5–35.7)
MCV: 87 fL (ref 79–97)
MONOS ABS: 0.6 10*3/uL (ref 0.1–0.9)
Monocytes: 8 %
NEUTROS ABS: 4.3 10*3/uL (ref 1.4–7.0)
Neutrophils Relative %: 62 %
RBC: 4.61 x10E6/uL (ref 4.14–5.80)
RDW: 14.3 % (ref 12.3–15.4)
WBC: 6.9 10*3/uL (ref 3.4–10.8)

## 2013-07-18 LAB — BASIC METABOLIC PANEL
BUN/Creatinine Ratio: 20 (ref 10–22)
BUN: 26 mg/dL (ref 8–27)
CALCIUM: 9.3 mg/dL (ref 8.6–10.2)
CO2: 26 mmol/L (ref 18–29)
CREATININE: 1.31 mg/dL — AB (ref 0.76–1.27)
Chloride: 103 mmol/L (ref 97–108)
GFR, EST AFRICAN AMERICAN: 57 mL/min/{1.73_m2} — AB (ref >59)
GFR, EST NON AFRICAN AMERICAN: 50 mL/min/{1.73_m2} — AB (ref >59)
GLUCOSE: 108 mg/dL — AB (ref 65–99)
POTASSIUM: 5.3 mmol/L — AB (ref 3.5–5.2)
SODIUM: 143 mmol/L (ref 134–144)

## 2013-07-18 LAB — LIPID PANEL
CHOLESTEROL TOTAL: 121 mg/dL (ref 100–199)
Chol/HDL Ratio: 2.8 ratio units (ref 0.0–5.0)
HDL: 43 mg/dL (ref >39)
LDL Calculated: 62 mg/dL (ref 0–99)
Triglycerides: 78 mg/dL (ref 0–149)
VLDL Cholesterol Cal: 16 mg/dL (ref 5–40)

## 2013-07-18 LAB — HEMOGLOBIN A1C
Est. average glucose Bld gHb Est-mCnc: 143 mg/dL
HEMOGLOBIN A1C: 6.6 % — AB (ref 4.8–5.6)

## 2013-07-23 ENCOUNTER — Encounter: Payer: Self-pay | Admitting: Internal Medicine

## 2013-07-23 ENCOUNTER — Ambulatory Visit (INDEPENDENT_AMBULATORY_CARE_PROVIDER_SITE_OTHER): Payer: Medicare Other | Admitting: Internal Medicine

## 2013-07-23 VITALS — BP 138/72 | HR 46 | Temp 97.8°F | Ht 74.0 in | Wt 207.0 lb

## 2013-07-23 DIAGNOSIS — E1322 Other specified diabetes mellitus with diabetic chronic kidney disease: Secondary | ICD-10-CM

## 2013-07-23 DIAGNOSIS — N189 Chronic kidney disease, unspecified: Secondary | ICD-10-CM

## 2013-07-23 DIAGNOSIS — G20 Parkinson's disease: Secondary | ICD-10-CM

## 2013-07-23 DIAGNOSIS — I4891 Unspecified atrial fibrillation: Secondary | ICD-10-CM

## 2013-07-23 DIAGNOSIS — I10 Essential (primary) hypertension: Secondary | ICD-10-CM

## 2013-07-23 DIAGNOSIS — I498 Other specified cardiac arrhythmias: Secondary | ICD-10-CM

## 2013-07-23 DIAGNOSIS — H6120 Impacted cerumen, unspecified ear: Secondary | ICD-10-CM

## 2013-07-23 DIAGNOSIS — N183 Chronic kidney disease, stage 3 unspecified: Secondary | ICD-10-CM

## 2013-07-23 DIAGNOSIS — E1142 Type 2 diabetes mellitus with diabetic polyneuropathy: Secondary | ICD-10-CM

## 2013-07-23 DIAGNOSIS — E1149 Type 2 diabetes mellitus with other diabetic neurological complication: Secondary | ICD-10-CM

## 2013-07-23 DIAGNOSIS — E1129 Type 2 diabetes mellitus with other diabetic kidney complication: Secondary | ICD-10-CM

## 2013-07-23 DIAGNOSIS — I482 Chronic atrial fibrillation, unspecified: Secondary | ICD-10-CM

## 2013-07-23 DIAGNOSIS — F0280 Dementia in other diseases classified elsewhere without behavioral disturbance: Secondary | ICD-10-CM | POA: Insufficient documentation

## 2013-07-23 DIAGNOSIS — E785 Hyperlipidemia, unspecified: Secondary | ICD-10-CM

## 2013-07-23 DIAGNOSIS — H6123 Impacted cerumen, bilateral: Secondary | ICD-10-CM

## 2013-07-23 DIAGNOSIS — R001 Bradycardia, unspecified: Secondary | ICD-10-CM

## 2013-07-23 MED ORDER — SIMVASTATIN 80 MG PO TABS: ORAL_TABLET | ORAL | Status: DC

## 2013-07-23 MED ORDER — CARBIDOPA-LEVODOPA 10-100 MG PO TABS: 2.0000 | ORAL_TABLET | Freq: Three times a day (TID) | ORAL | Status: DC

## 2013-07-23 NOTE — Progress Notes (Signed)
Failed clock drawing  

## 2013-07-23 NOTE — Progress Notes (Signed)
Patient ID: Ryan Cunningham, male   DOB: [birth year], 80 y.o.   MRN: [id number]    Chief Complaint  Patient presents with  . Annual Exam    Physical, discuss labs, & [employer] RX form   Allergies  Allergen Reactions  . Penicillins    HPI 80 y/o male patient is here for annual exam. He has history of afib, HTN, parkinson's disease, hyperlipidemia and prediabetes. He is here by himself. He denies any complaints. No falls reported.  He is above 80 years and declines any further FOBT or colonoscopy He feels stuffed in his ears Mood has been fair Sleeping good at night but feels sleeping more during day time Hard of hearing  Wt Readings from Last 3 Encounters:  07/23/13 207 lb (93.895 kg)  01/22/13 211 lb (95.709 kg)  08/20/12 206 lb 14.4 oz (93.849 kg)    Review of Systems  Constitutional: Negative for fever, chills, malaise/fatigue and diaphoresis.  HENT: Negative for congestion, hearing loss and sore throat.   Eyes: Negative for blurred vision, double vision and discharge.  Respiratory: Negative for cough, sputum production, shortness of breath and wheezing.   Cardiovascular: Negative for chest pain, palpitations, orthopnea and leg swelling.  Gastrointestinal: Negative for heartburn, nausea, vomiting, abdominal pain, diarrhea and constipation.  Genitourinary: Negative for dysuria, urgency, frequency and flank pain.  Musculoskeletal: Negative for back pain, falls, joint pain and myalgias. s/p back surgery Skin: Negative for itching and rash.  Neurological: Negative for dizziness, tingling, focal weakness and headaches.  Psychiatric/Behavioral: Negative for depression. The patient is not nervous/anxious.    Past Medical History  Diagnosis Date  . Other specified idiopathic peripheral neuropathy   . Atrial fibrillation   . Congestive heart failure, unspecified   . Ulcer of lower limb, unspecified   . Ulcer of other part of foot   . Cellulitis and abscess of foot, except toes   .  Pressure ulcer, other site(707.09)   . Acute bronchitis   . Acute bronchitis   . Obstructive sleep apnea (adult) (pediatric)   . Edema   . Acute maxillary sinusitis   . Chronic kidney disease, unspecified   . Urinary frequency   . Other atopic dermatitis and related conditions   . Syncope and collapse   . Encounter for long-term (current) use of other medications   . Other and unspecified hyperlipidemia   . Essential and other specified forms of tremor   . Coronary atherosclerosis of unspecified type of vessel, native or graft   . Cervicalgia   . Type II or unspecified type diabetes mellitus without mention of complication, not stated as uncontrolled   . Unspecified essential hypertension   . Seborrhea   . Paralysis agitans   . Right bundle branch block and left anterior fascicular block   . Cardiomegaly   . Peripheral vascular disease, unspecified   . Impotence of organic origin    Past Surgical History  Procedure Laterality Date  . Lumbar spine surgery  1970  . Knee surgery  1974  . Tonsillectomy  1981  . Lumbar spine surgery  1988  . Shoulder surgery  1988  . Cervical spine surgery  1988    Dr.Nudelman  . Back surgery  1991    Dr.Ames   . Cervical discectomy  2004    Dr.Kritzer  . Cataract extraction, bilateral  2008    Dr.Hecker   . Colonoscopy  06/08/2009    Dr.John Madilyn Fireman    Current Outpatient Prescriptions on File Prior  to Visit  Medication Sig Dispense Refill  . Aspirin Buf,AlHyd-MgHyd-CaCar, (ASCRIPTIN) 325 MG TABS Take 1 tablet by mouth daily.  360 each  0   No current facility-administered medications on file prior to visit.   History   Social History  . Marital Status: Married    Spouse Name: N/A    Number of Children: N/A  . Years of Education: N/A   Occupational History  . Not on file.   Social History Main Topics  . Smoking status: Former Smoker    Types: Cigarettes  . Smokeless tobacco: Not on file  . Alcohol Use: No  . Drug Use: No  .  Sexual Activity:    Other Topics Concern  . Not on file   Social History Narrative  . No narrative on file   Family History  Problem Relation Age of Onset  . Heart disease Father     CVA  . Heart disease Brother   . Heart disease Brother   . Heart disease Brother     MI  . Cancer Brother     Physical exam BP 138/72  Pulse 46  Temp(Src) 97.8 F (36.6 C) (Oral)  Ht 6\' 2"  (1.88 m)  Wt 207 lb (93.895 kg)  BMI 26.57 kg/m2  SpO2 99%  General- elderly male in no acute distress Head- atraumatic, normocephalic Eyes- PERRLA, EOMI, no pallor, no icterus, no discharge Ears- impacted ear cerumen  Neck- no lymphadenopathy, no thyromegaly, no jugular vein distension, no carotid bruit Nose- normal nasal mucosa, no maxillary sinus tenderness, no frontal sinus tenderness Mouth- normal mucus membrane, no oral thrush, normal oropharynx, has dentures Chest- no chest wall deformities, no chest wall tenderness Cardiovascular- normal s1,s2, no murmurs, feeble distal pulses Respiratory- bilateral clear to auscultation, no wheeze, no rhonchi, no crackles Abdomen- bowel sounds present, soft, non tender, no guarding or rigidity, no CVA tenderness Musculoskeletal- able to move all 4 extremities, no spinal and paraspinal tenderness, lumbar scar noted, unsteady gait, using a cane, has foot deformit, normal range of motion, no leg edema Neurological- resting tremor present, no focal deficit, normal reflexes, normal muscle strength, normal sensation to fine touch and vibration Skin- warm and dry, easy bruising, chronic venous stasis changes in his legs Psychiatry- alert and oriented to person and place, normal mood and affect  Labs-  CBC Latest Ref Rng 07/17/2013 07/26/2012 03/21/2010  WBC 3.4 - 10.8 x10E3/uL 6.9 6.6 8.9  Hemoglobin 12.6 - 17.7 g/dL 16.113.7 09.613.7 12.8(L)  Hematocrit 37.5 - 51.0 % 39.9 41.4 40.0  Platelets 150 - 400 K/uL - - 281   CMP     Component Value Date/Time   NA 143 07/17/2013  0828   NA 135 03/21/2010 2358   K 5.3* 07/17/2013 0828   CL 103 07/17/2013 0828   CO2 26 07/17/2013 0828   GLUCOSE 108* 07/17/2013 0828   GLUCOSE 125* 03/21/2010 2358   BUN 26 07/17/2013 0828   BUN 29* 03/21/2010 2358   CREATININE 1.31* 07/17/2013 0828   CALCIUM 9.3 07/17/2013 0828   PROT 7.0 01/20/2013 0828   AST 18 01/20/2013 0828   ALT 21 01/20/2013 0828   ALKPHOS 76 01/20/2013 0828   BILITOT 0.9 01/20/2013 0828   GFRNONAA 50* 07/17/2013 0828   GFRAA 57* 07/17/2013 0828   Lab Results  Component Value Date   HGBA1C 6.6* 07/17/2013   Lipid Panel     Component Value Date/Time   TRIG 78 07/17/2013 0828   HDL 43 07/17/2013 0828  CHOLHDL 2.8 07/17/2013 0828   LDLCALC 62 07/17/2013 0828   07/24/13 ekg afib, LAD, left anterior fascicular block (uncanged from before) 07/22/11 echocardiogram- LV cavity normal, normal systolic and diastolic dysfunction, EF 55%, mild pulmonary HTN  Assessment/plan  1. Essential hypertension Off all medication. Monitor clinically. Continue aspirin  2. Chronic atrial fibrillation Rate well controlled, reviewed echocardiogram from 2013, no new symptom. Continue aspirin. No anticoagulation with his easy bruising and fall history  3. Parkinson disease Continue sinement current regimen, refill provided  4. Other and unspecified hyperlipidemia Continue zocor 40 mg daily. Reviewed lipid panel, at goal  5. Bradycardia Persists. Currently asymptomatic. Monitor clinically  6. Other specified diabetes mellitus with diabetic chronic kidney disease a1c < 7, diet controlled, continue aspirin and statin. bp controlled. Monitor clinically for now. Refuses shingles. uptodate with other immunization  7. Diabetic polyneuropathy associated with type 2 diabetes mellitus Has hx of polyneuropathy, no fall in last 1 year. uptodate with foot exam.  8. CKD (chronic kidney disease), symptom management only, stage 3 (moderate) agen HTN, Vascular disease likely contributing to this. avoid NSAIDs. gfr  is > 50 at present and has normal calcium level. Check urine microalbumin. Avoid NSAIDS  9. Impacted cerumen, bilateral Removal of wax in left ear, to use debrox in right ear, pt refused right ear lavage due to discomfot, no bleed noted  10. V70.0 Patient lives with his wife. No falls reported. Lab reviewed. Reviewed immunization and screening. His mobility and ADLs somewhat limited with his tremors but still is mostly independent. Diet counselling. Walking for exercise as tolerated. Continue MVI

## 2013-07-24 LAB — MICROALBUMIN / CREATININE URINE RATIO
Creatinine, Ur: 72.6 mg/dL (ref 22.0–328.0)
MICROALB/CREAT RATIO: 58.1 mg/g{creat} — AB (ref 0.0–30.0)
Microalbumin, Urine: 42.2 ug/mL — ABNORMAL HIGH (ref 0.0–17.0)

## 2013-07-28 ENCOUNTER — Encounter: Payer: Self-pay | Admitting: *Deleted

## 2013-07-30 ENCOUNTER — Telehealth: Payer: Self-pay | Admitting: *Deleted

## 2013-07-30 MED ORDER — LISINOPRIL 5 MG PO TABS: ORAL_TABLET | ORAL | Status: DC

## 2013-07-30 NOTE — Telephone Encounter (Signed)
Patient called and stated that he received a letter in the mail stating that his protein was high and needed to be started on a medication. I called the patient and reviewed the labs with him and called in the Rx to [facility] on [address].

## 2013-10-10 ENCOUNTER — Ambulatory Visit (INDEPENDENT_AMBULATORY_CARE_PROVIDER_SITE_OTHER): Payer: Medicare Other

## 2013-10-10 DIAGNOSIS — Z23 Encounter for immunization: Secondary | ICD-10-CM

## 2013-11-24 ENCOUNTER — Other Ambulatory Visit: Payer: Self-pay | Admitting: Internal Medicine

## 2013-12-22 ENCOUNTER — Telehealth: Payer: Self-pay | Admitting: *Deleted

## 2013-12-22 NOTE — Telephone Encounter (Signed)
Patient called and stated that he very congested and has a cough. A lot of head congestion and achy. Woke up yesterday with this. Been taking Mucinex and Tylenol. Is there any other suggestions? No available appointments. Please Advise.

## 2013-12-23 NOTE — Telephone Encounter (Signed)
I recommend increasing hydration and getting plenty of rest.  Monitor for fever.  If he is getting worse or develops a fever, he should be seen in urgent care if we have no appts in the next few days.  Is see that he had his flu shot in October.

## 2013-12-23 NOTE — Telephone Encounter (Signed)
LMOM to return call.

## 2013-12-23 NOTE — Telephone Encounter (Signed)
Patient wife Notified and agreed.  

## 2013-12-25 ENCOUNTER — Other Ambulatory Visit: Payer: Self-pay | Admitting: Internal Medicine

## 2014-01-20 ENCOUNTER — Ambulatory Visit (INDEPENDENT_AMBULATORY_CARE_PROVIDER_SITE_OTHER): Payer: Medicare Other | Admitting: Internal Medicine

## 2014-01-20 ENCOUNTER — Encounter: Payer: Self-pay | Admitting: Internal Medicine

## 2014-01-20 VITALS — BP 128/80 | HR 60 | Temp 97.7°F | Resp 10 | Ht 74.0 in | Wt 203.0 lb

## 2014-01-20 DIAGNOSIS — G20 Parkinson's disease: Secondary | ICD-10-CM

## 2014-01-20 DIAGNOSIS — E1121 Type 2 diabetes mellitus with diabetic nephropathy: Secondary | ICD-10-CM

## 2014-01-20 DIAGNOSIS — R3915 Urgency of urination: Secondary | ICD-10-CM | POA: Diagnosis not present

## 2014-01-20 DIAGNOSIS — N183 Chronic kidney disease, stage 3 (moderate): Secondary | ICD-10-CM

## 2014-01-20 DIAGNOSIS — E1129 Type 2 diabetes mellitus with other diabetic kidney complication: Secondary | ICD-10-CM

## 2014-01-20 NOTE — Progress Notes (Signed)
Patient ID: Ryan Cunningham, male   DOB: 25-Oct-1927, 81 y.o.   MRN: [id number]    Chief Complaint  Patient presents with  . Medical Management of Chronic Issues    6 month follow-up, last labs 07/2013    Allergies  Allergen Reactions  . Penicillins    HPI 81 y/o male patient is here for routine visit. He has history of afib, HTN, parkinson's disease, hyperlipidemia and iabetes. He is here by himself. He denies any complaints. No falls reported.   He has been having increased urgency and urinary frequency. Denies dysuria or flank pain. No hematuria.  He is hard of hearing  Review of Systems  Constitutional: Negative for fever, chills, malaise/fatigue and diaphoresis.  HENT: Negative for congestion.  Respiratory: Negative for cough, sputum production, shortness of breath and wheezing.   Cardiovascular: Negative for chest pain, palpitations, orthopnea and leg swelling.  Gastrointestinal: Negative for heartburn, nausea, vomiting, abdominal pain, diarrhea and constipation.  Musculoskeletal: Negative for falls, joint pain and myalgias.  Skin: Negative for itching and rash.  Neurological: Negative for dizziness, tingling, focal weakness and headaches.   Past Medical History  Diagnosis Date  . Other specified idiopathic peripheral neuropathy   . Atrial fibrillation   . Congestive heart failure, unspecified   . Ulcer of lower limb, unspecified   . Ulcer of other part of foot   . Cellulitis and abscess of foot, except toes   . Pressure ulcer, other site(707.09)   . Acute bronchitis   . Acute bronchitis   . Obstructive sleep apnea (adult) (pediatric)   . Edema   . Acute maxillary sinusitis   . Chronic kidney disease, unspecified   . Urinary frequency   . Other atopic dermatitis and related conditions   . Syncope and collapse   . Encounter for long-term (current) use of other medications   . Other and unspecified hyperlipidemia   . Essential and other specified forms of tremor   .  Coronary atherosclerosis of unspecified type of vessel, native or graft   . Cervicalgia   . Type II or unspecified type diabetes mellitus without mention of complication, not stated as uncontrolled   . Unspecified essential hypertension   . Seborrhea   . Paralysis agitans   . Right bundle branch block and left anterior fascicular block   . Cardiomegaly   . Peripheral vascular disease, unspecified   . Impotence of organic origin    Current Outpatient Prescriptions on File Prior to Visit  Medication Sig Dispense Refill  . Aspirin Buf,AlHyd-MgHyd-CaCar, (ASCRIPTIN) 325 MG TABS Take 1 tablet by mouth daily. 360 each 0  . carbidopa-levodopa (SINEMET IR) 10-100 MG per tablet Take 2 tablets by mouth 3 (three) times daily. To help tremor. 740 tablet 4  . Multiple Vitamins-Minerals (ICAPS AREDS FORMULA PO) Take 1 tablet by mouth daily.    . simvastatin (ZOCOR) 80 MG tablet Take one half tablet by mouth daily to lower cholesterol. 45 tablet 4   No current facility-administered medications on file prior to visit.     Physical exam BP 128/80 mmHg  Pulse 60  Temp(Src) 97.7 F (36.5 C) (Oral)  Resp 10  Ht 6\' 2"  (1.88 m)  Wt 203 lb (92.08 kg)  BMI 26.05 kg/m2  SpO2 91%  Wt Readings from Last 3 Encounters:  01/20/14 203 lb (92.08 kg)  07/23/13 207 lb (93.895 kg)  01/22/13 211 lb (95.709 kg)   General- elderly male in no acute distress Head- atraumatic, normocephalic Neck- no  lymphadenopathy Cardiovascular- normal s1,s2, no murmurs Respiratory- bilateral clear to auscultation, no wheeze, no rhonchi, no crackles Abdomen- bowel sounds present, soft, non tender, no guarding or rigidity, no CVA tenderness Musculoskeletal- able to move all 4 extremities, no leg edema Neurological- resting tremor present, no focal deficit  Labs-  CBC Latest Ref Rng 07/17/2013 07/26/2012 03/21/2010  WBC 3.4 - 10.8 x10E3/uL 6.9 6.6 8.9  Hemoglobin 12.6 - 17.7 g/dL 40.9 81.1 12.8(L)  Hematocrit 37.5 - 51.0 %  39.9 41.4 40.0  Platelets 150 - 400 K/uL - - 281   CMP Latest Ref Rng 07/17/2013 01/20/2013 07/26/2012  Glucose 65 - 99 mg/dL 914(N) 829(F) 621(H)  BUN 8 - 27 mg/dL 26 26 08(M)  Creatinine 0.76 - 1.27 mg/dL 5.78(I) 6.96 2.95(M)  Sodium 134 - 144 mmol/L 143 142 140  Potassium 3.5 - 5.2 mmol/L 5.3(H) 4.7 5.2  Chloride 97 - 108 mmol/L 103 102 102  CO2 18 - 29 mmol/L Calcium 8.6 - 10.2 mg/dL 9.3 8.9 9.7  Total Protein 6.0 - 8.5 g/dL - 7.0 7.5  Albumin 3.5 - 4.7 g/dL - 4.5 4.3  Total Bilirubin 0.0 - 1.2 mg/dL - 0.9 0.8  Alkaline Phos 39 - 117 IU/L - 76 83  AST 0 - 40 IU/L - 18 29  ALT 0 - 44 IU/L - 21 30   Lipid Panel     Component Value Date/Time   TRIG 78 07/17/2013 0828   HDL 43 07/17/2013 0828   CHOLHDL 2.8 07/17/2013 0828   LDLCALC 62 07/17/2013 0828   Lab Results  Component Value Date   HGBA1C 6.6* 07/17/2013   Assessment/plan  1. CKD (chronic kidney disease), stage 3 (moderate) With his chronic dm and HTN. Monitor clinically, avoid nephrotoxic agent. Check renal function today - CMP - PSA - CBC with Differential; Future - Lipid Panel; Future - CMP; Future - Hemoglobin A1c; Future - TSH; Future - Microalbumin/Creatinine Ratio, Urine; Future  2. Urinary urgency Concern for BPH contributing to this. Send PSA. Pt would like to hold off on medication and urology referral for now.   3. Parkinson disease Continue current regimen of sinemet and monitor clinically  4. Diabetes mellitus with renal manifestations, controlled Cno recent a1c. Off all medications. Goal a1c < 7. Continue aspirin, statin and lisinopril. Check a1c and urine microalbumin today with cmp and lipid - Hemoglobin A1c; Future - Microalbumin/Creatinine Ratio, Urine; Future

## 2014-01-21 LAB — COMPREHENSIVE METABOLIC PANEL
ALT: 10 IU/L (ref 0–44)
AST: 15 IU/L (ref 0–40)
Albumin/Globulin Ratio: 1.3 (ref 1.1–2.5)
Albumin: 4.1 g/dL (ref 3.5–4.7)
Alkaline Phosphatase: 95 IU/L (ref 39–117)
BUN/Creatinine Ratio: 22 (ref 10–22)
BUN: 25 mg/dL (ref 8–27)
CALCIUM: 9.2 mg/dL (ref 8.6–10.2)
CO2: 26 mmol/L (ref 18–29)
Chloride: 102 mmol/L (ref 97–108)
Creatinine, Ser: 1.16 mg/dL (ref 0.76–1.27)
GFR calc Af Amer: 66 mL/min/{1.73_m2} (ref >59)
GFR, EST NON AFRICAN AMERICAN: 58 mL/min/{1.73_m2} — AB (ref >59)
GLUCOSE: 89 mg/dL (ref 65–99)
Globulin, Total: 3.1 g/dL (ref 1.5–4.5)
POTASSIUM: 4.9 mmol/L (ref 3.5–5.2)
Sodium: 142 mmol/L (ref 134–144)
TOTAL PROTEIN: 7.2 g/dL (ref 6.0–8.5)
Total Bilirubin: 0.8 mg/dL (ref 0.0–1.2)

## 2014-01-21 LAB — PSA: PSA: 3.4 ng/mL (ref 0.0–4.0)

## 2014-02-02 ENCOUNTER — Other Ambulatory Visit: Payer: Self-pay | Admitting: Internal Medicine

## 2014-03-03 ENCOUNTER — Encounter: Payer: Self-pay | Admitting: Internal Medicine

## 2014-03-03 ENCOUNTER — Other Ambulatory Visit: Payer: Self-pay | Admitting: Internal Medicine

## 2014-04-30 ENCOUNTER — Other Ambulatory Visit: Payer: Self-pay | Admitting: Internal Medicine

## 2014-04-30 DIAGNOSIS — H3531 Nonexudative age-related macular degeneration: Secondary | ICD-10-CM | POA: Diagnosis not present

## 2014-04-30 DIAGNOSIS — E119 Type 2 diabetes mellitus without complications: Secondary | ICD-10-CM | POA: Diagnosis not present

## 2014-04-30 DIAGNOSIS — H40013 Open angle with borderline findings, low risk, bilateral: Secondary | ICD-10-CM | POA: Diagnosis not present

## 2014-04-30 DIAGNOSIS — H16223 Keratoconjunctivitis sicca, not specified as Sjogren's, bilateral: Secondary | ICD-10-CM | POA: Diagnosis not present

## 2014-06-15 ENCOUNTER — Encounter: Payer: Self-pay | Admitting: Internal Medicine

## 2014-07-16 ENCOUNTER — Other Ambulatory Visit: Payer: Medicare Other

## 2014-07-16 DIAGNOSIS — N183 Chronic kidney disease, stage 3 (moderate): Secondary | ICD-10-CM | POA: Diagnosis not present

## 2014-07-16 DIAGNOSIS — E1121 Type 2 diabetes mellitus with diabetic nephropathy: Secondary | ICD-10-CM | POA: Diagnosis not present

## 2014-07-16 DIAGNOSIS — E1129 Type 2 diabetes mellitus with other diabetic kidney complication: Secondary | ICD-10-CM

## 2014-07-17 LAB — HEMOGLOBIN A1C
Est. average glucose Bld gHb Est-mCnc: 140 mg/dL
HEMOGLOBIN A1C: 6.5 % — AB (ref 4.8–5.6)

## 2014-07-17 LAB — COMPREHENSIVE METABOLIC PANEL
A/G RATIO: 1.5 (ref 1.1–2.5)
ALBUMIN: 4.1 g/dL (ref 3.5–4.7)
ALT: 18 IU/L (ref 0–44)
AST: 19 IU/L (ref 0–40)
Alkaline Phosphatase: 73 IU/L (ref 39–117)
BUN/Creatinine Ratio: 20 (ref 10–22)
BUN: 28 mg/dL — ABNORMAL HIGH (ref 8–27)
Bilirubin Total: 1 mg/dL (ref 0.0–1.2)
CALCIUM: 9.1 mg/dL (ref 8.6–10.2)
CO2: 22 mmol/L (ref 18–29)
CREATININE: 1.39 mg/dL — AB (ref 0.76–1.27)
Chloride: 103 mmol/L (ref 97–108)
GFR calc Af Amer: 53 mL/min/{1.73_m2} — ABNORMAL LOW (ref >59)
GFR calc non Af Amer: 46 mL/min/{1.73_m2} — ABNORMAL LOW (ref >59)
GLUCOSE: 122 mg/dL — AB (ref 65–99)
Globulin, Total: 2.8 g/dL (ref 1.5–4.5)
Potassium: 4.9 mmol/L (ref 3.5–5.2)
Sodium: 141 mmol/L (ref 134–144)
Total Protein: 6.9 g/dL (ref 6.0–8.5)

## 2014-07-17 LAB — CBC WITH DIFFERENTIAL/PLATELET
BASOS: 1 %
Basophils Absolute: 0.1 10*3/uL (ref 0.0–0.2)
EOS (ABSOLUTE): 0.1 10*3/uL (ref 0.0–0.4)
EOS: 2 %
HEMATOCRIT: 41.2 % (ref 37.5–51.0)
Hemoglobin: 13.2 g/dL (ref 12.6–17.7)
IMMATURE GRANULOCYTES: 0 %
Immature Grans (Abs): 0 10*3/uL (ref 0.0–0.1)
LYMPHS ABS: 1.5 10*3/uL (ref 0.7–3.1)
Lymphs: 24 %
MCH: 28.6 pg (ref 26.6–33.0)
MCHC: 32 g/dL (ref 31.5–35.7)
MCV: 89 fL (ref 79–97)
Monocytes Absolute: 0.5 10*3/uL (ref 0.1–0.9)
Monocytes: 9 %
Neutrophils Absolute: 4 10*3/uL (ref 1.4–7.0)
Neutrophils: 64 %
Platelets: 248 10*3/uL (ref 150–379)
RBC: 4.61 x10E6/uL (ref 4.14–5.80)
RDW: 14.5 % (ref 12.3–15.4)
WBC: 6.3 10*3/uL (ref 3.4–10.8)

## 2014-07-17 LAB — LIPID PANEL
CHOL/HDL RATIO: 3 ratio (ref 0.0–5.0)
CHOLESTEROL TOTAL: 118 mg/dL (ref 100–199)
HDL: 39 mg/dL — ABNORMAL LOW (ref >39)
LDL Calculated: 64 mg/dL (ref 0–99)
TRIGLYCERIDES: 76 mg/dL (ref 0–149)
VLDL Cholesterol Cal: 15 mg/dL (ref 5–40)

## 2014-07-17 LAB — MICROALBUMIN / CREATININE URINE RATIO
Creatinine, Urine: 141.6 mg/dL
MICROALB/CREAT RATIO: 28.7 mg/g creat (ref 0.0–30.0)
Microalbumin, Urine: 40.7 ug/mL

## 2014-07-17 LAB — TSH: TSH: 2.07 u[IU]/mL (ref 0.450–4.500)

## 2014-07-28 ENCOUNTER — Encounter: Payer: Medicare Other | Admitting: Internal Medicine

## 2014-07-29 ENCOUNTER — Encounter: Payer: Medicare Other | Admitting: Internal Medicine

## 2014-08-05 ENCOUNTER — Encounter: Payer: Self-pay | Admitting: Internal Medicine

## 2014-08-05 ENCOUNTER — Ambulatory Visit (INDEPENDENT_AMBULATORY_CARE_PROVIDER_SITE_OTHER): Payer: Medicare Other | Admitting: Internal Medicine

## 2014-08-05 VITALS — BP 110/72 | HR 50 | Temp 98.1°F | Resp 20 | Ht 74.0 in | Wt 200.4 lb

## 2014-08-05 DIAGNOSIS — I10 Essential (primary) hypertension: Secondary | ICD-10-CM | POA: Diagnosis not present

## 2014-08-05 DIAGNOSIS — Z0000 Encounter for general adult medical examination without abnormal findings: Secondary | ICD-10-CM

## 2014-08-05 DIAGNOSIS — I482 Chronic atrial fibrillation, unspecified: Secondary | ICD-10-CM

## 2014-08-05 DIAGNOSIS — E785 Hyperlipidemia, unspecified: Secondary | ICD-10-CM

## 2014-08-05 DIAGNOSIS — G20 Parkinson's disease: Secondary | ICD-10-CM

## 2014-08-05 DIAGNOSIS — E1142 Type 2 diabetes mellitus with diabetic polyneuropathy: Secondary | ICD-10-CM

## 2014-08-05 DIAGNOSIS — F0280 Dementia in other diseases classified elsewhere without behavioral disturbance: Secondary | ICD-10-CM

## 2014-08-05 DIAGNOSIS — E1121 Type 2 diabetes mellitus with diabetic nephropathy: Secondary | ICD-10-CM

## 2014-08-05 DIAGNOSIS — G20A1 Parkinson's disease without dyskinesia, without mention of fluctuations: Secondary | ICD-10-CM

## 2014-08-05 DIAGNOSIS — E1129 Type 2 diabetes mellitus with other diabetic kidney complication: Secondary | ICD-10-CM

## 2014-08-05 MED ORDER — SIMVASTATIN 80 MG PO TABS: ORAL_TABLET | ORAL | Status: DC

## 2014-08-05 MED ORDER — CARBIDOPA-LEVODOPA 10-100 MG PO TABS: 2.0000 | ORAL_TABLET | Freq: Three times a day (TID) | ORAL | Status: AC

## 2014-08-05 NOTE — Progress Notes (Signed)
Did not pass the clock test. 

## 2014-08-05 NOTE — Progress Notes (Signed)
Patient ID: Ryan Cunningham, male   DOB: August 29, 1927, 81 y.o.   MRN: [id number] Subjective:     Ryan Cunningham is a 81 y.o. male and is here for a comprehensive physical exam. The patient reports no problems. He gardens and likes to fish. No recent falls/hospitalizations. He does not feel depressed  He is a retired Teacher, English as a foreign language. He receives care from [facility] every 6 mos.    History   Past Medical History  Diagnosis Date  . Other specified idiopathic peripheral neuropathy   . Atrial fibrillation   . Congestive heart failure, unspecified   . Ulcer of lower limb, unspecified   . Ulcer of other part of foot   . Cellulitis and abscess of foot, except toes   . Pressure ulcer, other site(707.09)   . Acute bronchitis   . Acute bronchitis   . Obstructive sleep apnea (adult) (pediatric)   . Edema   . Acute maxillary sinusitis   . Chronic kidney disease, unspecified   . Urinary frequency   . Other atopic dermatitis and related conditions   . Syncope and collapse   . Encounter for long-term (current) use of other medications   . Other and unspecified hyperlipidemia   . Essential and other specified forms of tremor   . Coronary atherosclerosis of unspecified type of vessel, native or graft   . Cervicalgia   . Type II or unspecified type diabetes mellitus without mention of complication, not stated as uncontrolled   . Unspecified essential hypertension   . Seborrhea   . Paralysis agitans   . Right bundle branch block and left anterior fascicular block   . Cardiomegaly   . Peripheral vascular disease, unspecified   . Impotence of organic origin    Past Surgical History  Procedure Laterality Date  . Lumbar spine surgery  1970  . Knee surgery  1974  . Tonsillectomy  1981  . Lumbar spine surgery  1988  . Shoulder surgery  1988  . Cervical spine surgery  1988    Dr.Nudelman  . Back surgery  1991    Dr.Ames   . Cervical discectomy  2004    Dr.Kritzer  . Cataract extraction, bilateral   2008    Dr.Hecker   . Colonoscopy  06/08/2009    Dr.John Madilyn Fireman    Family History  Problem Relation Age of Onset  . Heart disease Father     CVA  . Heart disease Brother   . Heart disease Brother   . Heart disease Brother     MI  . Cancer Brother     Social History  . Marital Status: Married    Spouse Name: N/A  . Number of Children: N/A  . Years of Education: N/A   Occupational History  . Not on file.   Social History Main Topics  . Smoking status: Former Smoker    Types: Cigarettes  . Smokeless tobacco: Not on file  . Alcohol Use: No  . Drug Use: No  . Sexual Activity: Not on file   Other Topics Concern  . Not on file   Social History Narrative   Health Maintenance  Topic Date Due  . OPHTHALMOLOGY EXAM  04/23/1939  . TETANUS/TDAP  01/09/2005  . PNA vac Low Risk Adult (2 of 2 - PCV13) 01/10/2011  . FOOT EXAM  07/24/2014  . INFLUENZA VACCINE  08/10/2014  . HEMOGLOBIN A1C  01/16/2015  . URINE MICROALBUMIN  07/16/2015  . COLONOSCOPY  07/24/2023  .  ZOSTAVAX  Addressed    Review of Systems   Review of Systems  Unable to perform ROS: dementia     Objective:      Physical Exam  Constitutional: He is well-developed, well-nourished, and in no distress.  HENT:  Head: Normocephalic and atraumatic.  Right Ear: Hearing, tympanic membrane, external ear and ear canal normal.  Left Ear: Hearing, tympanic membrane, external ear and ear canal normal.  Mouth/Throat: Uvula is midline, oropharynx is clear and moist and mucous membranes are normal.  Eyes: Conjunctivae, EOM and lids are normal. Right eye exhibits no discharge. No scleral icterus.  Neck: Trachea normal. Neck supple. Carotid bruit is not present. No tracheal deviation present. No thyroid mass and no thyromegaly present.  Cardiovascular: Normal rate, regular rhythm and intact distal pulses.  Exam reveals no gallop and no friction rub.   Murmur (2/6 SEM) heard. Pulmonary/Chest: Effort normal and breath  sounds normal. No stridor. No respiratory distress. He has no wheezes. He has no rhonchi. He has no rales. He exhibits no mass, no tenderness and no crepitus. Right breast exhibits no inverted nipple, no mass, no nipple discharge, no skin change and no tenderness. Left breast exhibits no inverted nipple, no mass, no nipple discharge, no skin change and no tenderness. Breasts are symmetrical.  Abdominal: Soft. Normal appearance, normal aorta and bowel sounds are normal. He exhibits no abdominal bruit, no ascites, no pulsatile midline mass and no mass. There is no hepatosplenomegaly. There is no tenderness. There is no rebound. No hernia.  Genitourinary:  Deferred to [facility] provider  Musculoskeletal: He exhibits edema and tenderness.  Neck flexion cx. Multiple small and large joint deformities  Lymphadenopathy:       Head (right side): No submandibular and no posterior auricular adenopathy present.       Head (left side): No submandibular and no posterior auricular adenopathy present.    He has no cervical adenopathy.       Right: No supraclavicular adenopathy present.       Left: No supraclavicular adenopathy present.  Neurological: He is alert. He has normal strength. He displays tremor (pill rolling) and abnormal stance. A sensory deficit is present. Gait abnormal.  Skin: Skin is warm and intact. Rash (chronic venous stasis changes anterior b/l leg with redness on left mid leg. min clear d/c on left. no ulceration) noted.  Psychiatric: Mood and affect normal.   Diabetic Foot Exam - Simple   Simple Foot Form  Diabetic Foot exam was performed with the following findings:  Yes 08/05/2014  4:07 PM  Visual Inspection  See comments:  Yes  Sensation Testing  See comments:  Yes  Pulse Check  Posterior Tibialis and Dorsalis pulse intact bilaterally:  Yes  Comments  Anterior chronic venous stasis changes. Bunion b/l and hammertoes b/l. Skin breakdown on plantar surface Min calluses but no ulcerations.  Monofilament reduced b/l     REVIEWED LABS Recent Results (from the past 2160 hour(s))  CBC with Differential     Status: None   Collection Time: 07/16/14  8:23 AM  Result Value Ref Range   WBC 6.3 3.4 - 10.8 x10E3/uL   RBC 4.61 4.14 - 5.80 x10E6/uL   Hemoglobin 13.2 12.6 - 17.7 g/dL   Hematocrit 16.1 09.6 - 51.0 %   MCV 89 79 - 97 fL   MCH 28.6 26.6 - 33.0 pg   MCHC 32.0 31.5 - 35.7 g/dL   RDW 04.5 40.9 - 81.1 %   Platelets 248 150 -  379 x10E3/uL   Neutrophils 64 %   Lymphs 24 %   Monocytes 9 %   Eos 2 %   Basos 1 %   Neutrophils Absolute 4.0 1.4 - 7.0 x10E3/uL   Lymphocytes Absolute 1.5 0.7 - 3.1 x10E3/uL   Monocytes Absolute 0.5 0.1 - 0.9 x10E3/uL   EOS (ABSOLUTE) 0.1 0.0 - 0.4 x10E3/uL   Basophils Absolute 0.1 0.0 - 0.2 x10E3/uL   Immature Granulocytes 0 %   Immature Grans (Abs) 0.0 0.0 - 0.1 x10E3/uL  Lipid Panel     Status: Abnormal   Collection Time: 07/16/14  8:23 AM  Result Value Ref Range   Cholesterol, Total 118 100 - 199 mg/dL   Triglycerides 76 0 - 149 mg/dL   HDL 39 (L) >16 mg/dL    Comment: According to ATP-III Guidelines, HDL-C >59 mg/dL is considered a negative risk factor for CHD.    VLDL Cholesterol Cal 15 5 - 40 mg/dL   LDL Calculated 64 0 - 99 mg/dL   Chol/HDL Ratio 3.0 0.0 - 5.0 ratio units    Comment:                                   T. Chol/HDL Ratio                                             Men  Women                               1/2 Avg.Risk  3.4    3.3                                   Avg.Risk  5.0    4.4                                2X Avg.Risk  9.6    7.1                                3X Avg.Risk 23.4   11.0   CMP     Status: Abnormal   Collection Time: 07/16/14  8:23 AM  Result Value Ref Range   Glucose 122 (H) 65 - 99 mg/dL   BUN 28 (H) 8 - 27 mg/dL   Creatinine, Ser 1.09 (H) 0.76 - 1.27 mg/dL   GFR calc non Af Amer 46 (L) >59 mL/min/1.73   GFR calc Af Amer 53 (L) >59 mL/min/1.73   BUN/Creatinine Ratio 20 10 - 22   Sodium  141 134 - 144 mmol/L   Potassium 4.9 3.5 - 5.2 mmol/L   Chloride 103 97 - 108 mmol/L   CO2 22 18 - 29 mmol/L   Calcium 9.1 8.6 - 10.2 mg/dL   Total Protein 6.9 6.0 - 8.5 g/dL   Albumin 4.1 3.5 - 4.7 g/dL   Globulin, Total 2.8 1.5 - 4.5 g/dL   Albumin/Globulin Ratio 1.5 1.1 - 2.5   Bilirubin Total 1.0 0.0 - 1.2 mg/dL   Alkaline Phosphatase 73 39 - 117 IU/L  AST 19 0 - 40 IU/L   ALT 18 0 - 44 IU/L  Hemoglobin A1c     Status: Abnormal   Collection Time: 07/16/14  8:23 AM  Result Value Ref Range   Hgb A1c MFr Bld 6.5 (H) 4.8 - 5.6 %    Comment:          Pre-diabetes: 5.7 - 6.4          Diabetes: >6.4          Glycemic control for adults with diabetes: <7.0    Est. average glucose Bld gHb Est-mCnc 140 mg/dL  TSH     Status: None   Collection Time: 07/16/14  8:23 AM  Result Value Ref Range   TSH 2.070 0.450 - 4.500 uIU/mL  Microalbumin/Creatinine Ratio, Urine     Status: None   Collection Time: 07/16/14  8:23 AM  Result Value Ref Range   Creatinine, Urine 141.6 Not Estab. mg/dL   Microalbum.,U,Random 40.7 Not Estab. ug/mL   MICROALB/CREAT RATIO 28.7 0.0 - 30.0 mg/g creat      Assessment:    Healthy male exam.       ICD-9-CM ICD-10-CM   1. Well adult exam V70.0 Z00.00   2. Parkinson disease stable 332.0 G20   3. Diabetes mellitus with renal manifestations, controlled by diet 250.40 E11.21   4. Essential hypertension - stable 401.9 I10   5. Chronic atrial fibrillation - rate controlled 427.31 I48.2   6. Diabetic polyneuropathy associated with type 2 diabetes mellitus - stable 250.60 E11.42    357.2    7. Dementia due to Parkinson's disease without behavioral disturbance, mild - stable 332.0 G20    294.10 F02.80   8. Hyperlipidemia LDL goal <100 - stable 272.4 E78.5     Plan:     See After Visit Summary for Counseling Recommendations   Pt is UTD on health maintenance. Vaccinations are UTD. Pt maintains a healthy lifestyle. Encouraged pt to exercise 30-45 minutes 4-5  times per week. Eat a well balanced diet. Avoid smoking. Limit alcohol intake. Wear seatbelt when riding in the car. Wear sun block (SPF >50) when spending extended times outside.  He will need Prevnar and Tdap at next OV  [facility]  Continue current meds as ordered  Follow up in 6 mos for routine visit  Fredrika Canby S. Ancil Linsey  [facility] [address] [place], [place] [address] [phone] Cell (Monday-Friday 8 AM - 5 PM) [phone] After 5 PM and follow prompts

## 2014-08-05 NOTE — Patient Instructions (Signed)
Encouraged pt to exercise 30-45 minutes 4-5 times per week. Eat a well balanced diet. Avoid smoking. Limit alcohol intake. Wear seatbelt when riding in the car. Wear sun block (SPF >50) when spending extended times outside.  Follow up in 6 mos for routine visit  [facility]

## 2014-08-13 DIAGNOSIS — Z1389 Encounter for screening for other disorder: Secondary | ICD-10-CM | POA: Diagnosis not present

## 2014-08-13 DIAGNOSIS — N183 Chronic kidney disease, stage 3 (moderate): Secondary | ICD-10-CM | POA: Diagnosis not present

## 2014-08-13 DIAGNOSIS — G20 Parkinson's disease: Secondary | ICD-10-CM | POA: Diagnosis not present

## 2014-08-13 DIAGNOSIS — I10 Essential (primary) hypertension: Secondary | ICD-10-CM | POA: Diagnosis not present

## 2014-08-13 DIAGNOSIS — M545 Low back pain: Secondary | ICD-10-CM | POA: Diagnosis not present

## 2014-08-13 DIAGNOSIS — E785 Hyperlipidemia, unspecified: Secondary | ICD-10-CM | POA: Diagnosis not present

## 2014-09-18 DIAGNOSIS — R05 Cough: Secondary | ICD-10-CM | POA: Diagnosis not present

## 2014-09-18 DIAGNOSIS — J189 Pneumonia, unspecified organism: Secondary | ICD-10-CM | POA: Diagnosis not present

## 2014-09-18 DIAGNOSIS — N183 Chronic kidney disease, stage 3 (moderate): Secondary | ICD-10-CM | POA: Diagnosis not present

## 2014-09-18 DIAGNOSIS — I10 Essential (primary) hypertension: Secondary | ICD-10-CM | POA: Diagnosis not present

## 2014-09-18 DIAGNOSIS — R509 Fever, unspecified: Secondary | ICD-10-CM | POA: Diagnosis not present

## 2014-10-03 DIAGNOSIS — Z23 Encounter for immunization: Secondary | ICD-10-CM | POA: Diagnosis not present

## 2014-10-12 DIAGNOSIS — I10 Essential (primary) hypertension: Secondary | ICD-10-CM | POA: Diagnosis not present

## 2014-10-12 DIAGNOSIS — E785 Hyperlipidemia, unspecified: Secondary | ICD-10-CM | POA: Diagnosis not present

## 2014-10-12 DIAGNOSIS — G20 Parkinson's disease: Secondary | ICD-10-CM | POA: Diagnosis not present

## 2014-10-12 DIAGNOSIS — R05 Cough: Secondary | ICD-10-CM | POA: Diagnosis not present

## 2014-10-12 DIAGNOSIS — J189 Pneumonia, unspecified organism: Secondary | ICD-10-CM | POA: Diagnosis not present

## 2015-02-09 ENCOUNTER — Ambulatory Visit: Payer: Medicare Other | Admitting: Internal Medicine

## 2015-08-05 ENCOUNTER — Other Ambulatory Visit (HOSPITAL_COMMUNITY): Payer: Self-pay | Admitting: Internal Medicine

## 2015-08-05 DIAGNOSIS — R1310 Dysphagia, unspecified: Secondary | ICD-10-CM

## 2015-08-13 ENCOUNTER — Ambulatory Visit (HOSPITAL_COMMUNITY)
Admission: RE | Admit: 2015-08-13 | Discharge: 2015-08-13 | Disposition: A | Discharge status: Home / Self Care | Payer: Medicare Other | Source: Ambulatory Visit | Attending: Internal Medicine | Admitting: Internal Medicine

## 2015-08-13 DIAGNOSIS — G20 Parkinson's disease: Secondary | ICD-10-CM | POA: Diagnosis not present

## 2015-08-13 DIAGNOSIS — R1310 Dysphagia, unspecified: Secondary | ICD-10-CM

## 2015-08-13 DIAGNOSIS — R05 Cough: Secondary | ICD-10-CM | POA: Diagnosis not present

## 2016-08-01 ENCOUNTER — Encounter (INDEPENDENT_AMBULATORY_CARE_PROVIDER_SITE_OTHER): Payer: Self-pay | Admitting: Orthopedic Surgery

## 2016-08-01 ENCOUNTER — Ambulatory Visit (INDEPENDENT_AMBULATORY_CARE_PROVIDER_SITE_OTHER): Payer: Medicare Other | Admitting: Orthopedic Surgery

## 2016-08-01 DIAGNOSIS — E11621 Type 2 diabetes mellitus with foot ulcer: Secondary | ICD-10-CM | POA: Diagnosis not present

## 2016-08-01 DIAGNOSIS — L97421 Non-pressure chronic ulcer of left heel and midfoot limited to breakdown of skin: Secondary | ICD-10-CM | POA: Diagnosis not present

## 2016-08-01 DIAGNOSIS — I87323 Chronic venous hypertension (idiopathic) with inflammation of bilateral lower extremity: Secondary | ICD-10-CM | POA: Diagnosis not present

## 2016-08-01 NOTE — Progress Notes (Signed)
Office Visit Note   Patient: Ryan Cunningham           Date of Birth: [birth year]           MRN: [id number] Visit Date: 08/01/2016              Requested by: Jarome Matin, MD [address] [place], [place] [address] PCP: Jarome Matin, MD  Chief Complaint  Patient presents with  . Left Foot - Pain      HPI: Patient is a 83 year old gentleman who presents with a Wagner grade 1 ulcer beneath the left foot second metatarsal head. Patient's wife states that it just started as a callus and developed quickly she noticed odor and drainage in the home. Patient states that the patient has been diagnosed with Parkinson's and she states that he has been told that he has been diabetic close several times.  Assessment & Plan: Visit Diagnoses:  1. Diabetic ulcer of left midfoot associated with type 2 diabetes mellitus, limited to breakdown of skin (HCC)   2. Idiopathic chronic venous hypertension of both lower extremities with inflammation     Plan: The ulcer was debrided of skin and soft tissue back to bleeding viable granulation tissue. A compression dressing was applied. Patient will wash the wound with soap and water daily apply a dressing. A felted relieving donut was placed underneath his extra-depth custom orthotics to unload pressure from the second metatarsal head. Discussed that this ulcer goes down to bone that we would need to proceed with a ray amputation. Patient does have venous stasis swelling and we will need to address this after the treatment for the ulcer.  Follow-Up Instructions: Return in about 1 week (around 08/08/2016).   Ortho Exam  Patient is alert, oriented, no adenopathy, well-dressed, normal affect, normal respiratory effort. Examination patient has a good dorsalis pedis pulse he has brawny skin color changes in both legs with pitting edema consistent with chronic venous insufficiency there are no open ulcers. He has a large Wagner grade 1 ulcer beneath the  second metatarsal head he does not have protective sensation. After informed consent a 10 blade knife was used to debride the skin and soft tissue back to bleeding viable granulation tissue. Silver nitrate was used for hemostasis Iodosorb 2 x 2 and a Coban wrap was applied. The ulcer is 2 cm in diameter and 1 cm deep.  Imaging: No results found.  Labs: Lab Results  Component Value Date   HGBA1C 6.5 (H) 07/16/2014   HGBA1C 6.6 (H) 07/17/2013   HGBA1C 6.3 (H) 07/26/2012    Orders:  No orders of the defined types were placed in this encounter.  No orders of the defined types were placed in this encounter.    Procedures: No procedures performed  Clinical Data: No additional findings.  ROS:  All other systems negative, except as noted in the HPI. Review of Systems  Objective: Vital Signs: There were no vitals taken for this visit.  Specialty Comments:  No specialty comments available.  PMFS History: Patient Active Problem List   Diagnosis Date Noted  . Diabetic ulcer of left midfoot associated with type 2 diabetes mellitus, limited to breakdown of skin (HCC) 08/01/2016  . Idiopathic chronic venous hypertension of both lower extremities with inflammation 08/01/2016  . Hyperlipidemia LDL goal <100 08/05/2014  . Chronic atrial fibrillation (HCC) 08/05/2014  . Essential hypertension 08/05/2014  . Diabetes mellitus with renal manifestations, controlled (HCC) 08/05/2014  . Diabetes mellitus with renal complications (  HCC) 07/23/2013  . Diabetic polyneuropathy associated with type 2 diabetes mellitus (HCC) 07/23/2013  . CKD (chronic kidney disease), symptom management only 07/23/2013  . Impacted cerumen 07/23/2013  . 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 A-fib (HCC) 01/22/2013  . RBBB 01/22/2013  . OSA (obstructive sleep apnea) 01/22/2013  . Prediabetes 07/30/2012  . Bradycardia 04/03/2012  . Parkinson disease (HCC) 04/03/2012   . HTN (hypertension) 04/03/2012  . Other and unspecified hyperlipidemia 04/03/2012  . CKD (chronic kidney disease) 04/03/2012   Past Medical History:  Diagnosis Date  . Acute bronchitis   . Acute bronchitis   . Acute maxillary sinusitis   . Atrial fibrillation (HCC)   . Cardiomegaly   . Cellulitis and abscess of foot, except toes   . Cervicalgia   . Chronic kidney disease, unspecified   . Congestive heart failure, unspecified   . Coronary atherosclerosis of unspecified type of vessel, native or graft   . Edema   . Encounter for long-term (current) use of other medications   . Essential and other specified forms of tremor   . Impotence of organic origin   . Obstructive sleep apnea (adult) (pediatric)   . Other and unspecified hyperlipidemia   . Other atopic dermatitis and related conditions   . Other specified idiopathic peripheral neuropathy   . Paralysis agitans (HCC)   . Peripheral vascular disease, unspecified (HCC)   . Pressure ulcer, other site(707.09)   . Right bundle branch block and left anterior fascicular block   . Seborrhea   . Syncope and collapse   . Type II or unspecified type diabetes mellitus without mention of complication, not stated as uncontrolled   . Ulcer of lower limb, unspecified   . Ulcer of other part of foot   . Unspecified essential hypertension   . Urinary frequency     Family History  Problem Relation Age of Onset  . Heart disease Father        CVA  . Heart disease Brother   . Heart disease Brother   . Heart disease Brother        MI  . Cancer Brother     Past Surgical History:  Procedure Laterality Date  . BACK SURGERY  1991   Dr.Ames   . CATARACT EXTRACTION, BILATERAL  2008   Dr.Hecker   . CERVICAL DISCECTOMY  2004   Dr.Kritzer  . CERVICAL SPINE SURGERY  1988   Dr.Nudelman  . COLONOSCOPY  06/08/2009   Dr.John Madilyn FiremanHayes   . KNEE SURGERY  1974  . LUMBAR SPINE SURGERY  1970  . LUMBAR SPINE SURGERY  1988  . SHOULDER SURGERY  1988    . TONSILLECTOMY  1981   Social History   Occupational History  . Not on file.   Social History Main Topics  . Smoking status: Former Smoker    Types: Cigarettes  . Smokeless tobacco: Not on file  . Alcohol use No  . Drug use: No  . Sexual activity: Not on file

## 2016-08-08 ENCOUNTER — Ambulatory Visit (INDEPENDENT_AMBULATORY_CARE_PROVIDER_SITE_OTHER): Payer: Medicare Other | Admitting: Orthopedic Surgery

## 2016-08-08 ENCOUNTER — Encounter (INDEPENDENT_AMBULATORY_CARE_PROVIDER_SITE_OTHER): Payer: Self-pay | Admitting: Orthopedic Surgery

## 2016-08-08 VITALS — Ht 74.0 in | Wt 200.0 lb

## 2016-08-08 DIAGNOSIS — L97421 Non-pressure chronic ulcer of left heel and midfoot limited to breakdown of skin: Secondary | ICD-10-CM | POA: Diagnosis not present

## 2016-08-08 DIAGNOSIS — E11621 Type 2 diabetes mellitus with foot ulcer: Secondary | ICD-10-CM

## 2016-08-08 NOTE — Progress Notes (Signed)
Office Visit Note   Patient: Ryan Cunningham           Date of Birth: 1927/10/05           MRN: [id number] Visit Date: 08/08/2016              Requested by: Jarome MatinPaterson, Daniel, MD [address] [place], [place] [id number] PCP: Jarome MatinPaterson, Daniel, MD  Chief Complaint  Patient presents with  . Left Foot - Wound Check    Diabetic foot ulcer       HPI: Patient is an 83 year old gentleman with diabetic insensate neuropathy with a Wagner grade 1 ulcer beneath the third metatarsal head left foot he has a felt leaving donut he does have compression stockings but is not wearing them they do have Silvadene for dressing changes.  Assessment & Plan: Visit Diagnoses:  1. Diabetic ulcer of left midfoot associated with type 2 diabetes mellitus, limited to breakdown of skin (HCC)     Plan: Recommend he wear the compression stockings daily he does have a stocking diameter to help with getting them on recommended daily Dial soap cleansing Silvadene plus gauze and a Band-Aid and wear the orthotics with the felt leaving donut.  Patient was given a information sheet [facility] study. He will call to follow-up with this study.  Follow-Up Instructions: Return in about 3 weeks (around 08/29/2016).   Ortho Exam  Patient is alert, oriented, no adenopathy, well-dressed, normal affect, normal respiratory effort. Examination patient has an antalgic gait he is wearing his extra-depth shoes with the orthotics and the felt leaving donut. The ulcer is 2 cm in diameter 1 mm deep this does not probe to bone or tendon there is no drainage no odor no signs of infection. There is approximately 90% granulation tissue. There is some slight maceration with some clear drainage. Iodosorb gauze and a Band-Aid was applied.  Imaging: No results found.  Labs: Lab Results  Component Value Date   HGBA1C 6.5 (H) 07/16/2014   HGBA1C 6.6 (H) 07/17/2013   HGBA1C 6.3 (H) 07/26/2012    Orders:  No orders of  the defined types were placed in this encounter.  No orders of the defined types were placed in this encounter.    Procedures: No procedures performed  Clinical Data: No additional findings.  ROS:  All other systems negative, except as noted in the HPI. Review of Systems  Objective: Vital Signs: Ht 6\' 2"  (1.88 m)   Wt 200 lb (90.7 kg)   BMI 25.68 kg/m   Specialty Comments:  No specialty comments available.  PMFS History: Patient Active Problem List   Diagnosis Date Noted  . Diabetic ulcer of left midfoot associated with type 2 diabetes mellitus, limited to breakdown of skin (HCC) 08/01/2016  . Idiopathic chronic venous hypertension of both lower extremities with inflammation 08/01/2016  . Hyperlipidemia LDL goal <100 08/05/2014  . Chronic atrial fibrillation (HCC) 08/05/2014  . Essential hypertension 08/05/2014  . Diabetes mellitus with renal manifestations, controlled (HCC) 08/05/2014  . Diabetes mellitus with renal complications (HCC) 07/23/2013  . Diabetic polyneuropathy associated with type 2 diabetes mellitus (HCC) 07/23/2013  . CKD (chronic kidney disease), symptom management only 07/23/2013  . Impacted cerumen 07/23/2013  . 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 A-fib (HCC) 01/22/2013  . RBBB 01/22/2013  . OSA (obstructive sleep apnea) 01/22/2013  . Prediabetes 07/30/2012  . Bradycardia 04/03/2012  . Parkinson disease (HCC) 04/03/2012  . HTN (hypertension)  04/03/2012  . Other and unspecified hyperlipidemia 04/03/2012  . CKD (chronic kidney disease) 04/03/2012   Past Medical History:  Diagnosis Date  . Acute bronchitis   . Acute bronchitis   . Acute maxillary sinusitis   . Atrial fibrillation (HCC)   . Cardiomegaly   . Cellulitis and abscess of foot, except toes   . Cervicalgia   . Chronic kidney disease, unspecified   . Congestive heart failure, unspecified   . Coronary atherosclerosis of unspecified  type of vessel, native or graft   . Edema   . Encounter for long-term (current) use of other medications   . Essential and other specified forms of tremor   . Impotence of organic origin   . Obstructive sleep apnea (adult) (pediatric)   . Other and unspecified hyperlipidemia   . Other atopic dermatitis and related conditions   . Other specified idiopathic peripheral neuropathy   . Paralysis agitans (HCC)   . Peripheral vascular disease, unspecified (HCC)   . Pressure ulcer, other site(707.09)   . Right bundle branch block and left anterior fascicular block   . Seborrhea   . Syncope and collapse   . Type II or unspecified type diabetes mellitus without mention of complication, not stated as uncontrolled   . Ulcer of lower limb, unspecified   . Ulcer of other part of foot   . Unspecified essential hypertension   . Urinary frequency     Family History  Problem Relation Age of Onset  . Heart disease Father        CVA  . Heart disease Brother   . Heart disease Brother   . Heart disease Brother        MI  . Cancer Brother     Past Surgical History:  Procedure Laterality Date  . BACK SURGERY  1991   Dr.Ames   . CATARACT EXTRACTION, BILATERAL  2008   Dr.Hecker   . CERVICAL DISCECTOMY  2004   Dr.Kritzer  . CERVICAL SPINE SURGERY  1988   Dr.Nudelman  . COLONOSCOPY  06/08/2009   Dr.John Madilyn FiremanHayes   . KNEE SURGERY  1974  . LUMBAR SPINE SURGERY  1970  . LUMBAR SPINE SURGERY  1988  . SHOULDER SURGERY  1988  . TONSILLECTOMY  1981   Social History   Occupational History  . Not on file.   Social History Main Topics  . Smoking status: Former Smoker    Types: Cigarettes  . Smokeless tobacco: Never Used  . Alcohol use No  . Drug use: No  . Sexual activity: Not on file

## 2016-08-29 ENCOUNTER — Ambulatory Visit (INDEPENDENT_AMBULATORY_CARE_PROVIDER_SITE_OTHER): Payer: Medicare Other | Admitting: Orthopedic Surgery

## 2016-08-29 VITALS — Ht 74.0 in | Wt 200.0 lb

## 2016-08-29 DIAGNOSIS — L97421 Non-pressure chronic ulcer of left heel and midfoot limited to breakdown of skin: Secondary | ICD-10-CM

## 2016-08-29 DIAGNOSIS — E11621 Type 2 diabetes mellitus with foot ulcer: Secondary | ICD-10-CM

## 2016-08-29 NOTE — Progress Notes (Signed)
Office Visit Note   Patient: Ryan Cunningham           Date of Birth: 04/23/1927           MRN: [id number] Visit Date: 08/29/2016              Requested by: Jarome Matin, MD [address] [place], [place] [address] PCP: Jarome Matin, MD  Chief Complaint  Patient presents with  . Left Foot - Follow-up, Wound Check      HPI: Patient is an 83 year old gentleman with Parkinson's and diabetic insensate neuropathy with a chronic ulcer beneath the left second metatarsal head patient has had modification to his orthotics to unload the ulcer he presents at this time for follow-up evaluation.  Assessment & Plan: Visit Diagnoses:  1. Diabetic ulcer of left midfoot associated with type 2 diabetes mellitus, limited to breakdown of skin (HCC)     Plan: The ulcer is deeper that now probes down to bone. We will set him up for a stat MRI scan of the left foot. I anticipate that this will show osteomyelitis of the forefoot and patient most likely will require surgical intervention. Follow-up on Monday.  Follow-Up Instructions: Return in about 1 week (around 09/05/2016).   Ortho Exam  Patient is alert, oriented, no adenopathy, well-dressed, normal affect, normal respiratory effort. Examination patient has a flat affect. He has a good dorsalis pedis pulse he has venous stasis changes in his leg the ulcer beneath the second and third metatarsal was debrided of skin and soft tissue there was good bleeding there is no purulence no abscess no odor. The ulcer does probe to the second and third metatarsal heads at this time.  Imaging: No results found. No images are attached to the encounter.  Labs: Lab Results  Component Value Date   HGBA1C 6.5 (H) 07/16/2014   HGBA1C 6.6 (H) 07/17/2013   HGBA1C 6.3 (H) 07/26/2012    Orders:  Orders Placed This Encounter  Procedures  . MR Foot Left w/o contrast   No orders of the defined types were placed in this encounter.    Procedures: No  procedures performed  Clinical Data: No additional findings.  ROS:  All other systems negative, except as noted in the HPI. Review of Systems  Objective: Vital Signs: Ht 6\' 2"  (1.88 m)   Wt 200 lb (90.7 kg)   BMI 25.68 kg/m   Specialty Comments:  No specialty comments available.  PMFS History: Patient Active Problem List   Diagnosis Date Noted  . Diabetic ulcer of left midfoot associated with type 2 diabetes mellitus, limited to breakdown of skin (HCC) 08/01/2016  . Idiopathic chronic venous hypertension of both lower extremities with inflammation 08/01/2016  . Hyperlipidemia LDL goal <100 08/05/2014  . Chronic atrial fibrillation (HCC) 08/05/2014  . Essential hypertension 08/05/2014  . Diabetes mellitus with renal manifestations, controlled (HCC) 08/05/2014  . Diabetes mellitus with renal complications (HCC) 07/23/2013  . Diabetic polyneuropathy associated with type 2 diabetes mellitus (HCC) 07/23/2013  . CKD (chronic kidney disease), symptom management only 07/23/2013  . Impacted cerumen 07/23/2013  . 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 A-fib (HCC) 01/22/2013  . RBBB 01/22/2013  . OSA (obstructive sleep apnea) 01/22/2013  . Prediabetes 07/30/2012  . Bradycardia 04/03/2012  . Parkinson disease (HCC) 04/03/2012  . HTN (hypertension) 04/03/2012  . Other and unspecified hyperlipidemia 04/03/2012  . CKD (chronic kidney disease) 04/03/2012   Past Medical History:  Diagnosis Date  .  Acute bronchitis   . Acute bronchitis   . Acute maxillary sinusitis   . Atrial fibrillation (HCC)   . Cardiomegaly   . Cellulitis and abscess of foot, except toes   . Cervicalgia   . Chronic kidney disease, unspecified   . Congestive heart failure, unspecified   . Coronary atherosclerosis of unspecified type of vessel, native or graft   . Edema   . Encounter for long-term (current) use of other medications   . Essential and other  specified forms of tremor   . Impotence of organic origin   . Obstructive sleep apnea (adult) (pediatric)   . Other and unspecified hyperlipidemia   . Other atopic dermatitis and related conditions   . Other specified idiopathic peripheral neuropathy   . Paralysis agitans (HCC)   . Peripheral vascular disease, unspecified (HCC)   . Pressure ulcer, other site(707.09)   . Right bundle branch block and left anterior fascicular block   . Seborrhea   . Syncope and collapse   . Type II or unspecified type diabetes mellitus without mention of complication, not stated as uncontrolled   . Ulcer of lower limb, unspecified   . Ulcer of other part of foot   . Unspecified essential hypertension   . Urinary frequency     Family History  Problem Relation Age of Onset  . Heart disease Father        CVA  . Heart disease Brother   . Heart disease Brother   . Heart disease Brother        MI  . Cancer Brother     Past Surgical History:  Procedure Laterality Date  . BACK SURGERY  1991   Dr.Ames   . CATARACT EXTRACTION, BILATERAL  2008   Dr.Hecker   . CERVICAL DISCECTOMY  2004   Dr.Kritzer  . CERVICAL SPINE SURGERY  1988   Dr.Nudelman  . COLONOSCOPY  06/08/2009   Dr.John Madilyn Fireman   . KNEE SURGERY  1974  . LUMBAR SPINE SURGERY  1970  . LUMBAR SPINE SURGERY  1988  . SHOULDER SURGERY  1988  . TONSILLECTOMY  1981   Social History   Occupational History  . Not on file.   Social History Main Topics  . Smoking status: Former Smoker    Types: Cigarettes  . Smokeless tobacco: Never Used  . Alcohol use No  . Drug use: No  . Sexual activity: Not on file

## 2016-09-04 ENCOUNTER — Ambulatory Visit (INDEPENDENT_AMBULATORY_CARE_PROVIDER_SITE_OTHER): Payer: Medicare Other | Admitting: Orthopedic Surgery

## 2016-09-15 ENCOUNTER — Ambulatory Visit
Admission: RE | Admit: 2016-09-15 | Discharge: 2016-09-15 | Disposition: A | Discharge status: Home / Self Care | Payer: Medicare Other | Source: Ambulatory Visit | Attending: Orthopedic Surgery | Admitting: Orthopedic Surgery

## 2016-09-15 DIAGNOSIS — E11621 Type 2 diabetes mellitus with foot ulcer: Secondary | ICD-10-CM

## 2016-09-15 DIAGNOSIS — L97421 Non-pressure chronic ulcer of left heel and midfoot limited to breakdown of skin: Principal | ICD-10-CM

## 2016-09-18 ENCOUNTER — Encounter (HOSPITAL_COMMUNITY): Payer: Self-pay | Admitting: Family Medicine

## 2016-09-18 ENCOUNTER — Emergency Department (HOSPITAL_COMMUNITY): Payer: Medicare Other

## 2016-09-18 ENCOUNTER — Inpatient Hospital Stay (HOSPITAL_COMMUNITY)
Admission: EM | Admit: 2016-09-18 | Discharge: 2016-09-21 | DRG: 193 | Disposition: A | Discharge status: Skilled Nursing Facility | Payer: Medicare Other | Attending: Internal Medicine | Admitting: Internal Medicine

## 2016-09-18 DIAGNOSIS — G20 Parkinson's disease: Secondary | ICD-10-CM | POA: Diagnosis present

## 2016-09-18 DIAGNOSIS — D649 Anemia, unspecified: Secondary | ICD-10-CM | POA: Diagnosis present

## 2016-09-18 DIAGNOSIS — N189 Chronic kidney disease, unspecified: Secondary | ICD-10-CM | POA: Diagnosis present

## 2016-09-18 DIAGNOSIS — Z79899 Other long term (current) drug therapy: Secondary | ICD-10-CM | POA: Diagnosis not present

## 2016-09-18 DIAGNOSIS — G4733 Obstructive sleep apnea (adult) (pediatric): Secondary | ICD-10-CM | POA: Diagnosis present

## 2016-09-18 DIAGNOSIS — Y999 Unspecified external cause status: Secondary | ICD-10-CM | POA: Diagnosis not present

## 2016-09-18 DIAGNOSIS — E1122 Type 2 diabetes mellitus with diabetic chronic kidney disease: Secondary | ICD-10-CM | POA: Diagnosis present

## 2016-09-18 DIAGNOSIS — I130 Hypertensive heart and chronic kidney disease with heart failure and stage 1 through stage 4 chronic kidney disease, or unspecified chronic kidney disease: Secondary | ICD-10-CM | POA: Diagnosis present

## 2016-09-18 DIAGNOSIS — G9341 Metabolic encephalopathy: Secondary | ICD-10-CM | POA: Diagnosis present

## 2016-09-18 DIAGNOSIS — E1142 Type 2 diabetes mellitus with diabetic polyneuropathy: Secondary | ICD-10-CM | POA: Diagnosis present

## 2016-09-18 DIAGNOSIS — Z8249 Family history of ischemic heart disease and other diseases of the circulatory system: Secondary | ICD-10-CM | POA: Diagnosis not present

## 2016-09-18 DIAGNOSIS — Z809 Family history of malignant neoplasm, unspecified: Secondary | ICD-10-CM

## 2016-09-18 DIAGNOSIS — Z87891 Personal history of nicotine dependence: Secondary | ICD-10-CM | POA: Diagnosis not present

## 2016-09-18 DIAGNOSIS — E785 Hyperlipidemia, unspecified: Secondary | ICD-10-CM | POA: Diagnosis present

## 2016-09-18 DIAGNOSIS — R059 Cough, unspecified: Secondary | ICD-10-CM

## 2016-09-18 DIAGNOSIS — R443 Hallucinations, unspecified: Secondary | ICD-10-CM

## 2016-09-18 DIAGNOSIS — I248 Other forms of acute ischemic heart disease: Secondary | ICD-10-CM | POA: Diagnosis present

## 2016-09-18 DIAGNOSIS — I340 Nonrheumatic mitral (valve) insufficiency: Secondary | ICD-10-CM | POA: Diagnosis not present

## 2016-09-18 DIAGNOSIS — I509 Heart failure, unspecified: Secondary | ICD-10-CM | POA: Diagnosis not present

## 2016-09-18 DIAGNOSIS — Z880 Allergy status to penicillin: Secondary | ICD-10-CM | POA: Diagnosis not present

## 2016-09-18 DIAGNOSIS — L97519 Non-pressure chronic ulcer of other part of right foot with unspecified severity: Secondary | ICD-10-CM

## 2016-09-18 DIAGNOSIS — G9340 Encephalopathy, unspecified: Secondary | ICD-10-CM | POA: Diagnosis not present

## 2016-09-18 DIAGNOSIS — Z66 Do not resuscitate: Secondary | ICD-10-CM | POA: Diagnosis present

## 2016-09-18 DIAGNOSIS — R748 Abnormal levels of other serum enzymes: Secondary | ICD-10-CM

## 2016-09-18 DIAGNOSIS — J181 Lobar pneumonia, unspecified organism: Principal | ICD-10-CM | POA: Diagnosis present

## 2016-09-18 DIAGNOSIS — W19XXXA Unspecified fall, initial encounter: Secondary | ICD-10-CM | POA: Diagnosis not present

## 2016-09-18 DIAGNOSIS — Z9842 Cataract extraction status, left eye: Secondary | ICD-10-CM | POA: Diagnosis not present

## 2016-09-18 DIAGNOSIS — L97529 Non-pressure chronic ulcer of other part of left foot with unspecified severity: Secondary | ICD-10-CM

## 2016-09-18 DIAGNOSIS — L97509 Non-pressure chronic ulcer of other part of unspecified foot with unspecified severity: Secondary | ICD-10-CM | POA: Diagnosis present

## 2016-09-18 DIAGNOSIS — I10 Essential (primary) hypertension: Secondary | ICD-10-CM | POA: Diagnosis present

## 2016-09-18 DIAGNOSIS — I361 Nonrheumatic tricuspid (valve) insufficiency: Secondary | ICD-10-CM | POA: Diagnosis not present

## 2016-09-18 DIAGNOSIS — I2510 Atherosclerotic heart disease of native coronary artery without angina pectoris: Secondary | ICD-10-CM | POA: Diagnosis present

## 2016-09-18 DIAGNOSIS — I083 Combined rheumatic disorders of mitral, aortic and tricuspid valves: Secondary | ICD-10-CM | POA: Diagnosis present

## 2016-09-18 DIAGNOSIS — Z823 Family history of stroke: Secondary | ICD-10-CM

## 2016-09-18 DIAGNOSIS — R4182 Altered mental status, unspecified: Secondary | ICD-10-CM | POA: Diagnosis present

## 2016-09-18 DIAGNOSIS — L89613 Pressure ulcer of right heel, stage 3: Secondary | ICD-10-CM | POA: Diagnosis present

## 2016-09-18 DIAGNOSIS — Z23 Encounter for immunization: Secondary | ICD-10-CM

## 2016-09-18 DIAGNOSIS — I5031 Acute diastolic (congestive) heart failure: Secondary | ICD-10-CM | POA: Diagnosis present

## 2016-09-18 DIAGNOSIS — R001 Bradycardia, unspecified: Secondary | ICD-10-CM | POA: Diagnosis present

## 2016-09-18 DIAGNOSIS — R7989 Other specified abnormal findings of blood chemistry: Secondary | ICD-10-CM

## 2016-09-18 DIAGNOSIS — F0391 Unspecified dementia with behavioral disturbance: Secondary | ICD-10-CM | POA: Diagnosis not present

## 2016-09-18 DIAGNOSIS — R609 Edema, unspecified: Secondary | ICD-10-CM

## 2016-09-18 DIAGNOSIS — J810 Acute pulmonary edema: Secondary | ICD-10-CM

## 2016-09-18 DIAGNOSIS — S0181XA Laceration without foreign body of other part of head, initial encounter: Secondary | ICD-10-CM | POA: Diagnosis not present

## 2016-09-18 DIAGNOSIS — Y939 Activity, unspecified: Secondary | ICD-10-CM | POA: Diagnosis not present

## 2016-09-18 DIAGNOSIS — J189 Pneumonia, unspecified organism: Secondary | ICD-10-CM

## 2016-09-18 DIAGNOSIS — Z9841 Cataract extraction status, right eye: Secondary | ICD-10-CM | POA: Diagnosis not present

## 2016-09-18 DIAGNOSIS — I482 Chronic atrial fibrillation: Secondary | ICD-10-CM | POA: Diagnosis present

## 2016-09-18 DIAGNOSIS — S0990XA Unspecified injury of head, initial encounter: Secondary | ICD-10-CM | POA: Diagnosis present

## 2016-09-18 DIAGNOSIS — Y92129 Unspecified place in nursing home as the place of occurrence of the external cause: Secondary | ICD-10-CM | POA: Diagnosis not present

## 2016-09-18 DIAGNOSIS — L89629 Pressure ulcer of left heel, unspecified stage: Secondary | ICD-10-CM | POA: Diagnosis present

## 2016-09-18 DIAGNOSIS — F22 Delusional disorders: Secondary | ICD-10-CM

## 2016-09-18 DIAGNOSIS — R778 Other specified abnormalities of plasma proteins: Secondary | ICD-10-CM | POA: Diagnosis present

## 2016-09-18 DIAGNOSIS — E8770 Fluid overload, unspecified: Secondary | ICD-10-CM | POA: Diagnosis not present

## 2016-09-18 DIAGNOSIS — M542 Cervicalgia: Secondary | ICD-10-CM | POA: Diagnosis present

## 2016-09-18 DIAGNOSIS — R05 Cough: Secondary | ICD-10-CM

## 2016-09-18 LAB — URINALYSIS, ROUTINE W REFLEX MICROSCOPIC
Bacteria, UA: NONE SEEN
Bilirubin Urine: NEGATIVE
Glucose, UA: NEGATIVE mg/dL
HGB URINE DIPSTICK: NEGATIVE
Ketones, ur: 5 mg/dL — AB
LEUKOCYTES UA: NEGATIVE
Nitrite: NEGATIVE
Protein, ur: 30 mg/dL — AB
SPECIFIC GRAVITY, URINE: 1.019 (ref 1.005–1.030)
SQUAMOUS EPITHELIAL / LPF: NONE SEEN
pH: 5 (ref 5.0–8.0)

## 2016-09-18 LAB — COMPREHENSIVE METABOLIC PANEL
ALK PHOS: 142 U/L — AB (ref 38–126)
ALT: 16 U/L — AB (ref 17–63)
AST: 24 U/L (ref 15–41)
Albumin: 3.9 g/dL (ref 3.5–5.0)
Anion gap: 9 (ref 5–15)
BUN: 29 mg/dL — AB (ref 6–20)
CO2: 25 mmol/L (ref 22–32)
Calcium: 8.9 mg/dL (ref 8.9–10.3)
Chloride: 103 mmol/L (ref 101–111)
Creatinine, Ser: 1.23 mg/dL (ref 0.61–1.24)
GFR calc non Af Amer: 51 mL/min — ABNORMAL LOW (ref >60)
GFR, EST AFRICAN AMERICAN: 59 mL/min — AB (ref >60)
Glucose, Bld: 118 mg/dL — ABNORMAL HIGH (ref 65–99)
Potassium: 4.5 mmol/L (ref 3.5–5.1)
SODIUM: 137 mmol/L (ref 135–145)
TOTAL PROTEIN: 7.9 g/dL (ref 6.5–8.1)
Total Bilirubin: 1.3 mg/dL — ABNORMAL HIGH (ref 0.3–1.2)

## 2016-09-18 LAB — CBC
HCT: 36.4 % — ABNORMAL LOW (ref 39.0–52.0)
HEMOGLOBIN: 12.1 g/dL — AB (ref 13.0–17.0)
MCH: 29.4 pg (ref 26.0–34.0)
MCHC: 33.2 g/dL (ref 30.0–36.0)
MCV: 88.6 fL (ref 78.0–100.0)
PLATELETS: 227 10*3/uL (ref 150–400)
RBC: 4.11 MIL/uL — AB (ref 4.22–5.81)
RDW: 15.3 % (ref 11.5–15.5)
WBC: 11.1 10*3/uL — AB (ref 4.0–10.5)

## 2016-09-18 LAB — CBG MONITORING, ED: Glucose-Capillary: 108 mg/dL — ABNORMAL HIGH (ref 65–99)

## 2016-09-18 LAB — BRAIN NATRIURETIC PEPTIDE: B NATRIURETIC PEPTIDE 5: 651.8 pg/mL — AB (ref 0.0–100.0)

## 2016-09-18 LAB — TSH: TSH: 2.27 u[IU]/mL (ref 0.350–4.500)

## 2016-09-18 LAB — TROPONIN I: TROPONIN I: 0.04 ng/mL — AB (ref <0.03)

## 2016-09-18 MED ORDER — SODIUM CHLORIDE 0.9% FLUSH
3.0000 mL | Freq: Two times a day (BID) | INTRAVENOUS | Status: DC
  Administered 2016-09-19 – 2016-09-20 (×2): 3 mL via INTRAVENOUS

## 2016-09-18 MED ORDER — LEVOFLOXACIN IN D5W 750 MG/150ML IV SOLN
750.0000 mg | Freq: Every day | INTRAVENOUS | Status: DC
  Administered 2016-09-19: 750 mg via INTRAVENOUS
  Filled 2016-09-18: qty 150

## 2016-09-18 MED ORDER — SODIUM CHLORIDE 0.9 % IV SOLN
250.0000 mL | INTRAVENOUS | Status: DC | PRN
Start: 2016-09-18 — End: 2016-09-21

## 2016-09-18 MED ORDER — HYDRALAZINE HCL 20 MG/ML IJ SOLN
10.0000 mg | INTRAMUSCULAR | Status: DC | PRN
  Administered 2016-09-21: 10 mg via INTRAVENOUS
  Filled 2016-09-18: qty 0.5
  Filled 2016-09-18: qty 1

## 2016-09-18 MED ORDER — CARBIDOPA-LEVODOPA 10-100 MG PO TABS
2.0000 | ORAL_TABLET | Freq: Three times a day (TID) | ORAL | Status: DC
  Administered 2016-09-19 – 2016-09-21 (×6): 2 via ORAL
  Filled 2016-09-18 (×9): qty 2

## 2016-09-18 MED ORDER — ONDANSETRON HCL 4 MG PO TABS: 4.0000 mg | ORAL_TABLET | Freq: Four times a day (QID) | ORAL | Status: DC | PRN

## 2016-09-18 MED ORDER — SENNOSIDES-DOCUSATE SODIUM 8.6-50 MG PO TABS
1.0000 | ORAL_TABLET | Freq: Every evening | ORAL | Status: DC | PRN
Start: 2016-09-18 — End: 2016-09-21

## 2016-09-18 MED ORDER — HEPARIN SODIUM (PORCINE) 5000 UNIT/ML IJ SOLN
5000.0000 [IU] | Freq: Three times a day (TID) | INTRAMUSCULAR | Status: DC
  Administered 2016-09-19 – 2016-09-21 (×6): 5000 [IU] via SUBCUTANEOUS
  Filled 2016-09-18 (×7): qty 1

## 2016-09-18 MED ORDER — ACETAMINOPHEN 325 MG PO TABS
650.0000 mg | ORAL_TABLET | Freq: Four times a day (QID) | ORAL | Status: DC | PRN
  Administered 2016-09-21: 650 mg via ORAL
  Filled 2016-09-18: qty 2

## 2016-09-18 MED ORDER — PROSIGHT PO TABS
1.0000 | ORAL_TABLET | Freq: Every day | ORAL | Status: DC
  Administered 2016-09-19 – 2016-09-21 (×3): 1 via ORAL
  Filled 2016-09-18 (×3): qty 1

## 2016-09-18 MED ORDER — HYDROCODONE-ACETAMINOPHEN 5-325 MG PO TABS
1.0000 | ORAL_TABLET | ORAL | Status: DC | PRN
  Administered 2016-09-20: 1 via ORAL
  Filled 2016-09-18: qty 1

## 2016-09-18 MED ORDER — SODIUM CHLORIDE 0.9% FLUSH: 3.0000 mL | INTRAVENOUS | Status: DC | PRN

## 2016-09-18 MED ORDER — ALBUTEROL SULFATE (2.5 MG/3ML) 0.083% IN NEBU: 2.5000 mg | INHALATION_SOLUTION | RESPIRATORY_TRACT | Status: DC | PRN

## 2016-09-18 MED ORDER — SODIUM CHLORIDE 0.9% FLUSH
3.0000 mL | Freq: Two times a day (BID) | INTRAVENOUS | Status: DC
  Administered 2016-09-18: 3 mL via INTRAVENOUS

## 2016-09-18 MED ORDER — ACETAMINOPHEN 650 MG RE SUPP: 650.0000 mg | Freq: Four times a day (QID) | RECTAL | Status: DC | PRN

## 2016-09-18 MED ORDER — METHYLPHENIDATE HCL 5 MG PO TABS
10.0000 mg | ORAL_TABLET | Freq: Every day | ORAL | Status: DC
  Administered 2016-09-20 – 2016-09-21 (×2): 10 mg via ORAL
  Filled 2016-09-18 (×3): qty 2

## 2016-09-18 MED ORDER — BISACODYL 5 MG PO TBEC: 5.0000 mg | DELAYED_RELEASE_TABLET | Freq: Every day | ORAL | Status: DC | PRN

## 2016-09-18 MED ORDER — ONDANSETRON HCL 4 MG/2ML IJ SOLN: 4.0000 mg | Freq: Four times a day (QID) | INTRAMUSCULAR | Status: DC | PRN

## 2016-09-18 MED ORDER — ATORVASTATIN CALCIUM 40 MG PO TABS
40.0000 mg | ORAL_TABLET | Freq: Every day | ORAL | Status: DC
  Administered 2016-09-20: 40 mg via ORAL
  Filled 2016-09-18: qty 1

## 2016-09-18 MED ORDER — LOSARTAN POTASSIUM 50 MG PO TABS
100.0000 mg | ORAL_TABLET | Freq: Every day | ORAL | Status: DC
  Administered 2016-09-19 – 2016-09-21 (×3): 100 mg via ORAL
  Filled 2016-09-18 (×3): qty 2

## 2016-09-18 NOTE — ED Triage Notes (Signed)
[facility] Patient has a history of Parkinson's and experiencing Altered Mental Status. Patient started experiencing urinary incontinence yesterday and intermittently today. Per EMS, patient is alert to person, place, and situation, and disoriented to time. Also, patient does fall frequently but had 3 falls today with no obvious injury.

## 2016-09-18 NOTE — ED Notes (Signed)
Date and time results received: 09/18/16 .now (use smartphrase ".now" to insert current time)  Test: Troponin  Critical Value: 0.04  Name of Provider Notified: [facility]  Orders Received? Or Actions Taken?:

## 2016-09-18 NOTE — ED Notes (Signed)
Call report to Shawna OrleansMelanie, RN at 23:00    [phone]

## 2016-09-18 NOTE — ED Notes (Signed)
Assisted patient with standing up and using the urinal. Pt is unsteady with out assistance.

## 2016-09-18 NOTE — H&P (Signed)
History and Physical    Ryan Cunningham ZOX:[id number] DOB: 1927/06/24 DOA: 09/18/2016  PCP: Jarome Matin, MD   Patient coming from: Home  Chief Complaint: Confusion, gen weakness, cough  HPI: Ryan Cunningham is a 83 y.o. male with medical history significant for Parkinson's disease, hypertension, hyperlipidemia, and poorly healing foot ulcers followed by orthopedic surgery, now presenting to the emergency department for evaluation of confusion, generalized weakness, and productive cough. The patient is accompanied by his wife and daughter who assist with the history. He had reportedly been in his usual state until approximately 4 days ago when he was noted to develop a cough. Since that time cough has worsened and has been productive of thick dark sputum. He had been complaining of generalized weakness and malaise for the past couple days, and then for the past day, he has been confused and family reports that he has been hallucinating. He is not usually confused and does not typically hallucinate, but family reports that this has happened in the past in the setting of pneumonia. Patient acknowledges some dyspnea and productive cough, but denies any chest pain or palpitations, and denies any headache, change in vision or hearing, or focal numbness or weakness.  ED Course: Upon arrival to the ED, patient is found to be afebrile, saturating adequately on room air, bradycardic in the mid 40s, and with vitals otherwise stable. EKG features a sinus bradycardia with rate 46, chronic right bundle-branch block, and LVH by voltage criteria with secondary repolarization abnormality. Chest x-ray is notable for cardiomegaly with pulmonary vascular congestion and atelectasis versus pneumonia in the bases, left more so than right. Temperature panel reveals a BUN of 29, creatinine of 1.23, and bilirubin of 1.3. CBC is notable for a leukocytosis to 11,100 and a mild normocytic anemia with hemoglobin of 12.1. Troponin  is slightly elevated to 0.04 and BNP is elevated to 652. Urinalysis is notable for proteinuria and ketonuria. No treatment was given in the ED. Patient remained hemodynamically stable and has not been in any acute respiratory distress. He will be admitted to the telemetry unit for ongoing evaluation and management of confusion, suspected secondary to community-acquired pneumonia.  Review of Systems:  All other systems reviewed and apart from HPI, are negative.  Past Medical History:  Diagnosis Date  . Acute bronchitis   . Acute bronchitis   . Acute maxillary sinusitis   . Atrial fibrillation (HCC)   . Cardiomegaly   . Cellulitis and abscess of foot, except toes   . Cervicalgia   . Chronic kidney disease, unspecified   . Congestive heart failure, unspecified   . Coronary atherosclerosis of unspecified type of vessel, native or graft   . Edema   . Encounter for long-term (current) use of other medications   . Essential and other specified forms of tremor   . Impotence of organic origin   . Obstructive sleep apnea (adult) (pediatric)   . Other and unspecified hyperlipidemia   . Other atopic dermatitis and related conditions   . Other specified idiopathic peripheral neuropathy   . Paralysis agitans (HCC)   . Peripheral vascular disease, unspecified (HCC)   . Pressure ulcer, other site(707.09)   . Right bundle branch block and left anterior fascicular block   . Seborrhea   . Syncope and collapse   . Type II or unspecified type diabetes mellitus without mention of complication, not stated as uncontrolled   . Ulcer of lower limb, unspecified   . Ulcer of other part  of foot   . Unspecified essential hypertension   . Urinary frequency     Past Surgical History:  Procedure Laterality Date  . BACK SURGERY  1991   Dr.Ames   . CATARACT EXTRACTION, BILATERAL  2008   Dr.Hecker   . CERVICAL DISCECTOMY  2004   Dr.Kritzer  . CERVICAL SPINE SURGERY  1988   Dr.Nudelman  . COLONOSCOPY   06/08/2009   Dr.John Madilyn Fireman   . KNEE SURGERY  1974  . LUMBAR SPINE SURGERY  1970  . LUMBAR SPINE SURGERY  1988  . SHOULDER SURGERY  1988  . TONSILLECTOMY  1981     reports that he has quit smoking. His smoking use included Cigarettes. He has never used smokeless tobacco. He reports that he does not drink alcohol or use drugs.  Allergies  Allergen Reactions  . Penicillins     Family History  Problem Relation Age of Onset  . Heart disease Father        CVA  . Heart disease Brother   . Heart disease Brother   . Heart disease Brother        MI  . Cancer Brother      Prior to Admission medications   Medication Sig Start Date End Date Taking? Authorizing Provider  carbidopa-levodopa (SINEMET IR) 10-100 MG per tablet Take 2 tablets by mouth 3 (three) times daily. To help tremor. 08/05/14  Yes Montez Morita, Monica, DO  ipratropium (ATROVENT) 0.03 % nasal spray 1 spray as directed. 1 spray into mouth up to three times daily as directed to lessen drooling. 08/10/16  Yes [provider]  losartan (COZAAR) 100 MG tablet Take 100 mg by mouth daily.  07/18/16  Yes [provider]  methylphenidate (RITALIN) 10 MG tablet Take 10 mg by mouth daily. 08/10/16  Yes [provider]  SSD 1 % cream Apply 1 application topically 2 (two) times daily.  07/31/16  Yes [provider]  Multiple Vitamins-Minerals (ICAPS AREDS FORMULA PO) Take 1 tablet by mouth daily.    [provider]  simvastatin (ZOCOR) 80 MG tablet Take one half tablet by mouth daily to lower cholesterol. 08/05/14   Kirt Boys, DO    Physical Exam: Vitals:   09/18/16 2100 09/18/16 2122 09/18/16 2130 09/18/16 2200  BP: (!) 185/67 (!) 185/67 (!) 181/78 (!) 177/67  Pulse: (!) 45 (!) 46 (!) 45 (!) 46  Resp: Temp:      TempSrc:      SpO2: 98% 100% 100% 100%  Weight:      Height:          Constitutional: Not in acute distress, calm, appears chronically-ill, lethargic  Eyes: PERTLA,  lids and conjunctivae normal ENMT: Mucous membranes are moist. Posterior pharynx clear of any exudate or lesions.   Neck: normal, supple, no masses, no thyromegaly Respiratory: No pallor, no accessory muscle use. Slight dyspnea with speech. Coarse rales in the bases. Productive cough observed.  Cardiovascular: Rate ~50 and regular. Pedal edema bilaterally. Skin dry. Abdomen: No distension, no tenderness, no masses palpated. Bowel sounds normal.  Musculoskeletal: no clubbing / cyanosis. No joint deformity upper and lower extremities.  Skin: Ulcer at plantar aspect left foot and medial right heel without significant drainage or odor. Skin tears about the UE's. Poor turgor.  Neurologic: CN 2-12 grossly intact. Sensation intact. Strength 5/5 in all 4 limbs. Gross hearing deficit.  Psychiatric: Alert and oriented to person and place, not oriented to month or  year. Pleasant and cooperative.     Labs on Admission: I have personally reviewed following labs and imaging studies  CBC:  Recent Labs Lab 09/18/16 1908  WBC 11.1*  HGB 12.1*  HCT 36.4*  MCV 88.6  PLT 227   Basic Metabolic Panel:  Recent Labs Lab 09/18/16 1908  NA 137  K 4.5  CL 103  CO2 25  GLUCOSE 118*  BUN 29*  CREATININE 1.23  CALCIUM 8.9   GFR: Estimated Creatinine Clearance: 49.2 mL/min (by C-G formula based on SCr of 1.23 mg/dL). Liver Function Tests:  Recent Labs Lab 09/18/16 1908  AST 24  ALT 16*  ALKPHOS 142*  BILITOT 1.3*  PROT 7.9  ALBUMIN 3.9   No results for input(s): LIPASE, AMYLASE in the last 168 hours. No results for input(s): AMMONIA in the last 168 hours. Coagulation Profile: No results for input(s): INR, PROTIME in the last 168 hours. Cardiac Enzymes:  Recent Labs Lab 09/18/16 1908  TROPONINI 0.04*   BNP (last 3 results) No results for input(s): PROBNP in the last 8760 hours. HbA1C: No results for input(s): HGBA1C in the last 72 hours. CBG:  Recent Labs Lab 09/18/16 1819    GLUCAP 108*   Lipid Profile: No results for input(s): CHOL, HDL, LDLCALC, TRIG, CHOLHDL, LDLDIRECT in the last 72 hours. Thyroid Function Tests: No results for input(s): TSH, T4TOTAL, FREET4, T3FREE, THYROIDAB in the last 72 hours. Anemia Panel: No results for input(s): VITAMINB12, FOLATE, FERRITIN, TIBC, IRON, RETICCTPCT in the last 72 hours. Urine analysis:    Component Value Date/Time   COLORURINE YELLOW 09/18/2016 1843   APPEARANCEUR CLEAR 09/18/2016 1843   LABSPEC 1.019 09/18/2016 1843   PHURINE 5.0 09/18/2016 1843   GLUCOSEU NEGATIVE 09/18/2016 1843   HGBUR NEGATIVE 09/18/2016 1843   BILIRUBINUR NEGATIVE 09/18/2016 1843   KETONESUR 5 (A) 09/18/2016 1843   PROTEINUR 30 (A) 09/18/2016 1843   UROBILINOGEN 1.0 03/21/2010 2342   NITRITE NEGATIVE 09/18/2016 1843   LEUKOCYTESUR NEGATIVE 09/18/2016 1843   Sepsis Labs: (procalcitonin:4,lacticidven:4) )No results found for this or any previous visit (from the past 240 hour(s)).   Radiological Exams on Admission: Dg Chest 2 View  Result Date: 09/18/2016 CLINICAL DATA:  Cough.  Multiple falls. EXAM: CHEST  2 VIEW COMPARISON:  03/21/2010 FINDINGS: Cardiomegaly with vascular congestion. Mild interstitial prominence could reflect early edema. Bibasilar opacities, left greater than right, atelectasis versus infiltrates. No effusions or acute bony abnormality. IMPRESSION: Cardiomegaly with vascular congestion and possible early interstitial edema. Bibasilar atelectasis or infiltrates, left greater than right, atelectasis versus pneumonia. Electronically Signed   By: Charlett Nose M.D.   On: 09/18/2016 19:14    EKG: Independently reviewed. Sinus bradycardia (rate 46), RBBB, LVH with secondary repolarization abnormality.   Assessment/Plan  1. Pneumonia   - Pt presents with confusion; he has had a productive cough and gen weakness for the past 3-4 days  - No hypoxia or acute respiratory distress, but noted to have leukocytosis,  purulent sputum, and basilar infiltrates on CXR  - Plan to obtain blood and sputum cultures, start empiric Levaquin in setting of penicillin allergy, and continue supportive care with prn supplemental O2 and breathing treatments   2. Acute encephalopathy - No focal neurologic deficits identified  - Has had waxing and waning confusion and apparent hallucinations over the past 2 days  - Suspected secondary to PNA, with Parkinson's likely contributing  - Plan to treat PNA as above, continue supportive care, check TSH    3. Parkinson's  disease - Pt presents with confusion and hallucinations, possibly reflecting worsening in his PD, but more likely secondary to CAP as above  - Continue Sinemet     4. Hypertension  - BP slightly elevated in ED  - Continue losartan, use hydralazine IVP's prn   5. Elevated troponin  - No anginal complaints  - Troponin slightly elevated to 0.04 in ED  - Continue cardiac monitoring and trend troponin measurements    6. Sinus bradycardia  - HR persisting in 40's in ED - Possibly related to dysautonomia in setting of PD  - Unlikely ischemic without anginal complaints  - Continue cardiac monitoring, trend troponin as above, check echocardiogram   7. Hypervolemia   - Pt has elevated BNP on admission, bilateral pedal edema, and vascular congestion on CXR  - Plan to SLIV, allow ad lib PO fluid, continue losartan, andcheck echocardiogram    8. Foot ulcers - Ulcers noted on plantar left foot and right medial heel without drainage or odor  - Follows with orthopedist outpatient  - Wound care consultation requested    DVT prophylaxis: sq heparin  Code Status: Full  Family Communication: Wife and daughter updated at bedside Disposition Plan: Admit to telemetry  Consults called: None Admission status: Inpatient    Ryan Deutscherimothy S Izzy Doubek, MD [facility] Pager [phone]  If 7PM-7AM, please contact night-coverage [email or web address] Password [email or web address]  09/18/2016,  10:05 PM

## 2016-09-18 NOTE — ED Notes (Signed)
[facility] RN for assigned room 1445.
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
## 2016-09-19 ENCOUNTER — Inpatient Hospital Stay (HOSPITAL_COMMUNITY): Payer: Medicare Other

## 2016-09-19 DIAGNOSIS — G9341 Metabolic encephalopathy: Secondary | ICD-10-CM

## 2016-09-19 DIAGNOSIS — L97519 Non-pressure chronic ulcer of other part of right foot with unspecified severity: Secondary | ICD-10-CM

## 2016-09-19 DIAGNOSIS — J810 Acute pulmonary edema: Secondary | ICD-10-CM

## 2016-09-19 DIAGNOSIS — L97529 Non-pressure chronic ulcer of other part of left foot with unspecified severity: Secondary | ICD-10-CM

## 2016-09-19 LAB — CBC WITH DIFFERENTIAL/PLATELET
Basophils Absolute: 0 10*3/uL (ref 0.0–0.1)
Basophils Relative: 0 %
EOS PCT: 1 %
Eosinophils Absolute: 0.1 10*3/uL (ref 0.0–0.7)
HEMATOCRIT: 34.6 % — AB (ref 39.0–52.0)
Hemoglobin: 11.3 g/dL — ABNORMAL LOW (ref 13.0–17.0)
LYMPHS ABS: 1.3 10*3/uL (ref 0.7–4.0)
LYMPHS PCT: 12 %
MCH: 28.8 pg (ref 26.0–34.0)
MCHC: 32.7 g/dL (ref 30.0–36.0)
MCV: 88.3 fL (ref 78.0–100.0)
Monocytes Absolute: 1 10*3/uL (ref 0.1–1.0)
Monocytes Relative: 10 %
Neutro Abs: 8 10*3/uL — ABNORMAL HIGH (ref 1.7–7.7)
Neutrophils Relative %: 77 %
PLATELETS: 214 10*3/uL (ref 150–400)
RBC: 3.92 MIL/uL — AB (ref 4.22–5.81)
RDW: 15.4 % (ref 11.5–15.5)
WBC: 10.4 10*3/uL (ref 4.0–10.5)

## 2016-09-19 LAB — COMPREHENSIVE METABOLIC PANEL
ALBUMIN: 3.6 g/dL (ref 3.5–5.0)
ALK PHOS: 129 U/L — AB (ref 38–126)
ALT: 24 U/L (ref 17–63)
AST: 26 U/L (ref 15–41)
Anion gap: 9 (ref 5–15)
BILIRUBIN TOTAL: 1.4 mg/dL — AB (ref 0.3–1.2)
BUN: 27 mg/dL — ABNORMAL HIGH (ref 6–20)
CALCIUM: 8.6 mg/dL — AB (ref 8.9–10.3)
CO2: 23 mmol/L (ref 22–32)
CREATININE: 1.11 mg/dL (ref 0.61–1.24)
Chloride: 104 mmol/L (ref 101–111)
GFR calc Af Amer: >60 mL/min (ref >60)
GFR calc non Af Amer: 58 mL/min — ABNORMAL LOW (ref >60)
GLUCOSE: 140 mg/dL — AB (ref 65–99)
Potassium: 4.1 mmol/L (ref 3.5–5.1)
SODIUM: 136 mmol/L (ref 135–145)
Total Protein: 7.5 g/dL (ref 6.5–8.1)

## 2016-09-19 LAB — EXPECTORATED SPUTUM ASSESSMENT W GRAM STAIN, RFLX TO RESP C

## 2016-09-19 LAB — RPR: RPR: NONREACTIVE

## 2016-09-19 LAB — AMMONIA: Ammonia: 11 umol/L (ref 9–35)

## 2016-09-19 LAB — TROPONIN I
TROPONIN I: 0.03 ng/mL — AB (ref <0.03)
TROPONIN I: 0.04 ng/mL — AB (ref <0.03)

## 2016-09-19 LAB — EXPECTORATED SPUTUM ASSESSMENT W REFEX TO RESP CULTURE

## 2016-09-19 LAB — HIV ANTIBODY (ROUTINE TESTING W REFLEX): HIV Screen 4th Generation wRfx: NONREACTIVE

## 2016-09-19 LAB — VITAMIN B12: Vitamin B-12: 371 pg/mL (ref 180–914)

## 2016-09-19 LAB — STREP PNEUMONIAE URINARY ANTIGEN: Strep Pneumo Urinary Antigen: NEGATIVE

## 2016-09-19 MED ORDER — DEXTROSE 5 % IV SOLN
1.0000 g | INTRAVENOUS | Status: DC
  Administered 2016-09-19 – 2016-09-20 (×2): 1 g via INTRAVENOUS
  Filled 2016-09-19 (×2): qty 10

## 2016-09-19 MED ORDER — HALOPERIDOL LACTATE 5 MG/ML IJ SOLN: 0.5000 mg | Freq: Four times a day (QID) | INTRAMUSCULAR | Status: DC | PRN

## 2016-09-19 MED ORDER — HALOPERIDOL LACTATE 5 MG/ML IJ SOLN
0.5000 mg | Freq: Once | INTRAMUSCULAR | Status: AC
  Administered 2016-09-19: 0.5 mg via INTRAVENOUS
  Filled 2016-09-19: qty 1

## 2016-09-19 MED ORDER — DEXTROSE 5 % IV SOLN
100.0000 mg | Freq: Two times a day (BID) | INTRAVENOUS | Status: DC
  Administered 2016-09-19 – 2016-09-21 (×4): 100 mg via INTRAVENOUS
  Filled 2016-09-19 (×5): qty 100

## 2016-09-19 NOTE — Consult Note (Signed)
WOC Nurse wound consult note Reason for Consult:Two full thickness wounds on bilateral feet:  Right foot, medial heel is a Stage 3 pressure injury (POA), left foot plantar aspect is full thickness. Wound type:Pressure, neuropathic Pressure Injury POA: Yes Measurement:Right medial heel:  2cm x 1.5cm x 0.2cm with pink, moist wound bed, scant serous drainage Left plantar foot:  2cm x 1.8cm x 0.2cm with red, moist and hypergranulating wound bed, small to moderate amount of serous drainage Wound bed:As described above Drainage (amount, consistency, odor) As described above Periwound:intact, with some hygiene issues Dressing procedure/placement/frequency: I have ordered conservative (saline moistened gauze dressings) to be applied/changed twice daily.  Feet are to be placed in pressure redistribution heel boots. Turning and repositioning are in place as is incontinence care using our house products. WOC nursing team will not follow, but will remain available to this patient, the nursing and medical teams.  Please re-consult if needed. Thanks, Ladona MowLaurie Eidan Muellner, MSN, RN, GNP, Hans EdenCWOCN, CWON-AP, FAAN  Pager# [phone]

## 2016-09-19 NOTE — Progress Notes (Signed)
PROGRESS NOTE                                                                                                                                                                                                             Patient Demographics:    Ryan Cunningham, is a 83 y.o. male, DOB - [birth year], WVP:[id number]  Admit date - 09/18/2016   Admitting Physician Briscoe Deutscher, MD  Outpatient Primary MD for the patient is Jarome Matin, MD  LOS - 1  Outpatient Specialists:None  Chief Complaint  Patient presents with  . Altered Mental Status       Brief Narrative   83 year old male with Parkinson's disease, hypertension, hyperlipidemia, poorly healing foot ulcers followed by Dr. Lajoyce Corners presented to the ED with generalized weakness, productive cough and increasing confusion with hallucinations. Per family patient has had similar confusion associated with pneumonia in the past. In the ED he was afebrile with normal sats on room air but was bradycardic in the 40s. Chest x-ray showed pulmonary vascular congestion and atelectasis versus lung base pneumonia (>are). Blood work showed WBC of 11 K, mildly elevated troponin of 0.04, BNP of 652, BUN of 29 and creatinine 1.3. UA was negative for infection. Admitted to telemetry.   Subjective:   Patient complaining of cough with thick rusty phlegm. He seemed oriented to place and person this morning but became agitated and combative requiring low-dose Haldol this afternoon. Heart rate has been mostly in the 40s and occasionally dropping down to high 30s.   Assessment  & Plan :   Principal problem Community acquired pneumonia/ left lobar pneumonia Was on empiric Levaquin which I switched to Rocephin and doxycycline given his prolonged QTC. Follow cultures. Mentating sats on room air. Robitussin for cough. Wife denies patient having symptoms of choking at home but does have some coughing  spells when he tries to eat buckle 4 at once.. Will obtain swallow eval to rule out aspiration.  Sinus bradycardia Per wife his heart rate usually stays in the mid 40s. Continue telemetry monitoring. Avoid rate limiting agents. Patient is DO NOT RESUSCITATE and wife and daughter prefer no aggressive measures including chemical resuscitation if symptoms deteriorate.  ?Acute diastolic CHF Pending 2-D echo. Added daily IV Lasix.  Acute metabolic encephalopathy Possibly associated with pneumonia and worsening of his Parkinson's with acute illness.  use low-dose Haldol for agitation (minimize dose to 2 prolonged QTc).  Parkinson's disease Continue Sinemet.  Essential hypertension Elevated blood pressure on admission. Continue losartan when necessary hydralazine.  Elevated troponin Mild. Suspect demand ischemia.  Bilateral foot ulcers. Stage III right medial heel pressure injury and full-thickness plantar left foot ulcer. Wound care consult appreciated.    Code Status : DO NOT RESUSCITATE  Family Communication  : Wife and daughter at bedside  Disposition Plan  : Pending hospital course, PT evaluation  Barriers For Discharge : Active symptoms  Consults  : None  Procedures  : Pending 2-D echo  DVT Prophylaxis  :  Lovenox -   Lab Results  Component Value Date   PLT 214 09/19/2016    Antibiotics  :   Anti-infectives    Start     Dose/Rate Route Frequency Ordered Stop   09/18/16 2300  levofloxacin (LEVAQUIN) IVPB 750 mg     750 mg 100 mL/hr over 90 Minutes Intravenous Daily at bedtime 09/18/16 2224 09/23/16 2159        Objective:   Vitals:   09/18/16 2312 09/19/16 0034 09/19/16 0500 09/19/16 0915  BP: (!) 149/74 (!) 174/63  (!) 157/47  Pulse: (!) 45 (!) 50  (!) 48  Resp: (!) 23 (!) 23  20  Temp:  97.7 F (36.5 C)  98.4 F (36.9 C)  TempSrc:  Oral  Oral  SpO2: 96% 100%  100%  Weight:  86.3 kg (190 lb 3.2 oz) 85.2 kg (187 lb 13.3 oz)   Height:        Wt  Readings from Last 3 Encounters:  09/19/16 85.2 kg (187 lb 13.3 oz)  08/29/16 90.7 kg (200 lb)  08/08/16 90.7 kg (200 lb)     Intake/Output Summary (Last 24 hours) at 09/19/16 1506 Last data filed at 09/19/16 0918  Gross per 24 hour  Intake              553 ml  Output                0 ml  Net              553 ml     Physical Exam  Gen: not in distress, Fatigue HEENT:  moist mucosa, supple neck, no JVD Chest: Bibasilar crackles CVS: S1 and S2 bradycardic, no murmurs rubs or gallop GI: soft, NT, ND, BS+ Musculoskeletal: warm, trace edema bilaterally ,  bilateral foot ulcers CNS: AAOX1-2    Data Review:    CBC  Recent Labs Lab 09/18/16 1908 09/19/16 0058  WBC 11.1* 10.4  HGB 12.1* 11.3*  HCT 36.4* 34.6*  PLT 227 214  MCV 88.6 88.3  MCH 29.4 28.8  MCHC 33.2 32.7  RDW 15.3 15.4  LYMPHSABS  --  1.3  MONOABS  --  1.0  EOSABS  --  0.1  BASOSABS  --  0.0    Chemistries   Recent Labs Lab 09/18/16 1908 09/19/16 0058  NA 137 136  K 4.5 4.1  CL 103 104  CO2 25 23  GLUCOSE 118* 140*  BUN 29* 27*  CREATININE 1.23 1.11  CALCIUM 8.9 8.6*  AST 24 26  ALT 16* 24  ALKPHOS 142* 129*  BILITOT 1.3* 1.4*   ------------------------------------------------------------------------------------------------------------------ No results for input(s): CHOL, HDL, LDLCALC, TRIG, CHOLHDL, LDLDIRECT in the last 72 hours.  Lab Results  Component Value Date   HGBA1C 6.5 (H) 07/16/2014   ------------------------------------------------------------------------------------------------------------------  Recent Labs  09/18/16 1900  TSH 2.270   ------------------------------------------------------------------------------------------------------------------  Recent Labs  09/19/16 0058  VITAMINB12 371    Coagulation profile No results for input(s): INR, PROTIME in the last 168 hours.  No results for input(s): DDIMER in the last 72 hours.  Cardiac Enzymes  Recent  Labs Lab 09/18/16 1908 09/19/16 0058 09/19/16 0555  TROPONINI 0.04* 0.04* 0.03*   ------------------------------------------------------------------------------------------------------------------    Component Value Date/Time   BNP 651.8 (H) 09/18/2016 1909    Inpatient Medications  Scheduled Meds: . atorvastatin  40 mg Oral q1800  . carbidopa-levodopa  2 tablet Oral TID WC  . heparin  5,000 Units Subcutaneous Q8H  . losartan  100 mg Oral Daily  . methylphenidate  10 mg Oral Daily  . multivitamin  1 tablet Oral Daily  . sodium chloride flush  3 mL Intravenous Q12H  . sodium chloride flush  3 mL Intravenous Q12H   Continuous Infusions: . sodium chloride    . levofloxacin (LEVAQUIN) IV Stopped (09/19/16 0248)   PRN Meds:.sodium chloride, acetaminophen **OR** acetaminophen, albuterol, bisacodyl, hydrALAZINE, HYDROcodone-acetaminophen, ondansetron **OR** ondansetron (ZOFRAN) IV, senna-docusate, sodium chloride flush  Micro Results Recent Results (from the past 240 hour(s))  Culture, sputum-assessment     Status: None   Collection Time: 09/19/16 12:13 PM  Result Value Ref Range Status   Specimen Description EXPECTORATED SPUTUM  Final   Special Requests NONE  Final   Sputum evaluation THIS SPECIMEN IS ACCEPTABLE FOR SPUTUM CULTURE  Final   Report Status 09/19/2016 FINAL  Final    Radiology Reports Dg Chest 2 View  Result Date: 09/18/2016 CLINICAL DATA:  Cough.  Multiple falls. EXAM: CHEST  2 VIEW COMPARISON:  03/21/2010 FINDINGS: Cardiomegaly with vascular congestion. Mild interstitial prominence could reflect early edema. Bibasilar opacities, left greater than right, atelectasis versus infiltrates. No effusions or acute bony abnormality. IMPRESSION: Cardiomegaly with vascular congestion and possible early interstitial edema. Bibasilar atelectasis or infiltrates, left greater than right, atelectasis versus pneumonia. Electronically Signed   By: Charlett Nose M.D.   On:  09/18/2016 19:14   Mr Foot Left W/o Contrast  Result Date: 09/15/2016 CLINICAL DATA:  Diabetic ulcer along the left midfoot. EXAM: MRI OF THE LEFT FOOT WITHOUT CONTRAST TECHNIQUE: Multiplanar, multisequence MR imaging of the left foot was performed. No intravenous contrast was administered. COMPARISON:  None. FINDINGS: Bones/Joint/Cartilage Soft tissue ulcer along the plantar aspect of the forefoot overlying the second and third metatarsal heads with a tiny adjacent fluid collection measuring 3.4 x 7.7 x 3.8 mm overlying the for third metatarsal head. No periosteal reaction or bone destruction. No marrow signal abnormality. No fracture or dislocation. Normal alignment. No joint effusion. Hallux valgus with lateral subluxation of the first proximal phalanx. Moderate osteoarthritis of the first MTP joint. Severe osteoarthritis of the fourth MTP joint. Ligaments Collateral ligaments are intact.  Intact Lisfranc ligament. Muscles and Tendons Flexor, peroneal and extensor compartment tendons are intact. Atrophy of the plantar musculature. Mild increased T2 signal within the plantar musculature likely neurogenic. Soft tissue No other fluid collection or hematoma. No soft tissue mass. Severe soft tissue edema along the dorsal aspect of the midfoot and forefoot. IMPRESSION: 1. Soft tissue ulcer along the plantar aspect of the forefoot overlying the second and third metatarsal heads with a tiny adjacent fluid collection measuring 3.4 x 7.7 x 3.8 mm overlying the for third metatarsal head. No evidence of osteomyelitis of the left forefoot. Electronically Signed   By: Elige Ko   On: 09/15/2016 10:12    Time  Spent in minutes  35   Eddie North M.D on 09/19/2016 at 3:06 PM  Between 7am to 7pm - Pager - [phone]  After 7pm go to [email or web address] - password [facility]  [facility] -  Office  [phone]

## 2016-09-19 NOTE — Progress Notes (Signed)
Patient has become progressively agitated and combative with staff. Pulling at tubes and hitting at staff. Trying to get out of bed and not following any commands. Refusing to take any medications. MD notified with orders obtained. Mitts applied for safety. Melton Alarana A Zakar Brosch, RN

## 2016-09-19 NOTE — Progress Notes (Signed)
Pt is confused and currently requiring mits over his hands to prevent him pulling at his IV or catheter.  Pt waxing and waning agitated state will not tolerate CPAP and it is questionable whether he could remove cpap mask with mits on should he become nauseas.  No CPAP at this time.

## 2016-09-19 NOTE — Progress Notes (Signed)
Patient incontinent of urine and agreed to allow staff to clean him up and reapply condom cath without any aggressiveness or use of PRN med. He then went immediately to sleep. Restraint order not put into effect at current time. Will monitor for now. Ryan Alarana A Lasonia Casino, RN

## 2016-09-19 NOTE — Progress Notes (Signed)
Patient continues to wax and wane with periods of agitation and combativeness. Refusing to following directions or taking medications and trying to get out of bed. Med notified with further orders received for restraints for safety. Floor mats in place as per protocol. Melton Alarana A Doxie Augenstein, RN

## 2016-09-19 NOTE — Progress Notes (Signed)
Unable to complete echo due to patient being combative.  [name], Ryan Cunningham 09/19/2016, 1:44 PM

## 2016-09-20 ENCOUNTER — Inpatient Hospital Stay (HOSPITAL_COMMUNITY): Payer: Medicare Other

## 2016-09-20 DIAGNOSIS — I361 Nonrheumatic tricuspid (valve) insufficiency: Secondary | ICD-10-CM

## 2016-09-20 DIAGNOSIS — I340 Nonrheumatic mitral (valve) insufficiency: Secondary | ICD-10-CM

## 2016-09-20 DIAGNOSIS — J181 Lobar pneumonia, unspecified organism: Principal | ICD-10-CM

## 2016-09-20 LAB — ECHOCARDIOGRAM COMPLETE
HEIGHTINCHES: 74 in
Weight: 3005.31 oz

## 2016-09-20 LAB — FOLATE RBC
FOLATE, RBC: 1155 ng/mL (ref >498)
Folate, Hemolysate: 404.4 ng/mL
Hematocrit: 35 % — ABNORMAL LOW (ref 37.5–51.0)

## 2016-09-20 MED ORDER — INFLUENZA VAC SPLIT HIGH-DOSE 0.5 ML IM SUSY
0.5000 mL | PREFILLED_SYRINGE | INTRAMUSCULAR | Status: AC
  Administered 2016-09-21: 0.5 mL via INTRAMUSCULAR
  Filled 2016-09-20: qty 0.5

## 2016-09-20 NOTE — Progress Notes (Signed)
SLP Cancellation Note  Patient Details Name: Ryan Cunningham MRN: [id number] DOB: Nov 21, 1927   Cancelled treatment:       Reason Eval/Treat Not Completed: Patient at procedure or test/unavailable. Will continue efforts. Pt currently receiving regular diet (heart healthy) and thin liquids.  Arlando Leisinger B. Murvin NatalBueche, [facility]P, CCC-SLP Speech Language Pathologist [phone]  Leigh AuroraBueche, Lashe Oliveira Brown 09/20/2016, 1:03 PM

## 2016-09-20 NOTE — Progress Notes (Signed)
   09/20/16 1400  Clinical Encounter Type  Visited With Patient and family together  Visit Type Initial;Other (Comment) (advance directive)  Referral From Other (Comment) (admitting)  Stress Factors  Patient Stress Factors Health changes  Advance Directives (For Healthcare)  Does Patient Have a Medical Advance Directive? Yes  Does patient want to make changes to medical advance directive? No - Patient declined  Type of Estate agentAdvance Directive Healthcare Power of [place];Living will  Copy of Healthcare Power of Attorney in Chart? Yes  Copy of Living Will in Chart? Yes  Would patient like information on creating a medical advance directive? No - Patient declined     Chaplain assisted pt with completing Advance Directive.  Pt voiced understanding of health care power of attorney and stated he would like spouse and daughter to serve as HCPOA.   Voiced understanding of living will.    Family understands that spouse and daughter are next of kin, but would like HCPOA on file.   Chaplain notarized and placed in chart.     Pt with original and copies.

## 2016-09-20 NOTE — Progress Notes (Signed)
PROGRESS NOTE    Ryan Cunningham  UJW:[id number] DOB: [birth year] DOA: 09/18/2016 PCP: Jarome Matin, MD     Brief Narrative:  Ryan Cunningham is a 83 yo male with Parkinson's disease, hypertension, hyperlipidemia, poorly healing foot ulcers followed by Dr. Lajoyce Corners presented to the ED with generalized weakness, productive cough and increasing confusion with hallucinations. Per family patient has had similar confusion associated with pneumonia in the past. In the ED he was afebrile with normal sats on room air but was bradycardic in the 40s. Chest x-ray showed pulmonary vascular congestion and atelectasis versus lung base pneumonia. Blood work showed WBC of 11 K, mildly elevated troponin of 0.04, BNP of 652, BUN of 29 and creatinine 1.3. UA was negative for infection.   Assessment & Plan:   Principal Problem:   CAP (community acquired pneumonia) Active Problems:   Bradycardia   Parkinson disease (HCC)   HTN (hypertension)   OSA (obstructive sleep apnea)   Acute encephalopathy   Anemia   Hypervolemia   Elevated troponin   Foot ulcer (HCC)  Acute metabolic encephalopathy -Has history of Parkinson's disease, per family patient does have some intermittent hallucinations and confusion. Often worsens in the setting of infection such as pneumonia -Much improved today, continue to monitor  Left lobar pneumonia -Continue Rocephin, doxycycline -Sputum culture pending  -Blood culture pending  -SLP evaluation to rule out aspiration component  Sinus bradycardia -Apparently has been evaluated by cardiology in the past, pacemaker was considered, but deferred at the time as patient had been asymptomatic from this. Patient's blood pressure is stable. Continue to monitor on telemetry  Questionable diastolic heart failure -BNP 651 with bilateral ankle edema  -Check echocardiogram -Continue Lasix for now  Parkinson's disease -Continue Sinemet  Essential hypertension -Continue  losartan  Elevated troponin -0.04, 0.04, 0.03 -Secondary to demand ischemia  Bilateral foot ulcers -Stage III right medial heel pressure injury and full-thickness plantar left foot ulcer, present on admission -Wound care consult  HLD -Continue lipitor    DVT prophylaxis: subq hep Code Status: DNR Family Communication: daughter and wife over the phone Disposition Plan: Pending improvement    Consultants:   None  Procedures:   None   Antimicrobials:  Anti-infectives    Start     Dose/Rate Route Frequency Ordered Stop   09/19/16 2200  cefTRIAXone (ROCEPHIN) 1 g in dextrose 5 % 50 mL IVPB     1 g 100 mL/hr over 30 Minutes Intravenous Every 24 hours 09/19/16 1517     09/19/16 2200  doxycycline (VIBRAMYCIN) 100 mg in dextrose 5 % 250 mL IVPB     100 mg 125 mL/hr over 120 Minutes Intravenous Every 12 hours 09/19/16 1517     09/18/16 2300  levofloxacin (LEVAQUIN) IVPB 750 mg  Status:  Discontinued     750 mg 100 mL/hr over 90 Minutes Intravenous Daily at bedtime 09/18/16 2224 09/19/16 1517       Subjective: Patient mildly confused today. Does not give any meaningful history. Denies any pain.  Objective: Vitals:   09/19/16 0915 09/19/16 2101 09/20/16 0500 09/20/16 0815  BP: (!) 157/47 (!) 154/67  (!) 156/67  Pulse: (!) 48 65  (!) 47  Resp: Temp: 98.4 F (36.9 C) 98 F (36.7 C)  98.6 F (37 C)  TempSrc: Oral Oral  Oral  SpO2: 100% 98%  100%  Weight:   85.2 kg (187 lb 13.3 oz)   Height:  Intake/Output Summary (Last 24 hours) at 09/20/16 1313 Last data filed at 09/20/16 1056  Gross per 24 hour  Intake              550 ml  Output             1325 ml  Net             -775 ml   Filed Weights   09/19/16 0034 09/19/16 0500 09/20/16 0500  Weight: 86.3 kg (190 lb 3.2 oz) 85.2 kg (187 lb 13.3 oz) 85.2 kg (187 lb 13.3 oz)    Examination:  General exam: Appears calm and comfortable  Respiratory system: Basilar crackles, Respiratory effort  normal. Cardiovascular system: S1 & S2 heard, bradycardic, regular rhythm. No JVD, murmurs, rubs, gallops or clicks. +1 pedal edema. Gastrointestinal system: Abdomen is nondistended, soft and nontender. No organomegaly or masses felt. Normal bowel sounds heard. Central nervous system: Alert, nonfocal  Extremities: Symmetric 5 x 5 power. Skin: +bilateral prevlon boots  Psychiatry: +confused but calm, no agitation   Data Reviewed: I have personally reviewed following labs and imaging studies  CBC:  Recent Labs Lab 09/18/16 1908 09/19/16 0058  WBC 11.1* 10.4  NEUTROABS  --  8.0*  HGB 12.1* 11.3*  HCT 36.4* 34.6*  MCV 88.6 88.3  PLT 227 214   Basic Metabolic Panel:  Recent Labs Lab 09/18/16 1908 09/19/16 0058  NA 137 136  K 4.5 4.1  CL 103 104  CO2 25 23  GLUCOSE 118* 140*  BUN 29* 27*  CREATININE 1.23 1.11  CALCIUM 8.9 8.6*   GFR: Estimated Creatinine Clearance: 54.5 mL/min (by C-G formula based on SCr of 1.11 mg/dL). Liver Function Tests:  Recent Labs Lab 09/18/16 1908 09/19/16 0058  AST 24 26  ALT 16* 24  ALKPHOS 142* 129*  BILITOT 1.3* 1.4*  PROT 7.9 7.5  ALBUMIN 3.9 3.6   No results for input(s): LIPASE, AMYLASE in the last 168 hours.  Recent Labs Lab 09/19/16 0058  AMMONIA 11   Coagulation Profile: No results for input(s): INR, PROTIME in the last 168 hours. Cardiac Enzymes:  Recent Labs Lab 09/18/16 1908 09/19/16 0058 09/19/16 0555  TROPONINI 0.04* 0.04* 0.03*   BNP (last 3 results) No results for input(s): PROBNP in the last 8760 hours. HbA1C: No results for input(s): HGBA1C in the last 72 hours. CBG:  Recent Labs Lab 09/18/16 1819  GLUCAP 108*   Lipid Profile: No results for input(s): CHOL, HDL, LDLCALC, TRIG, CHOLHDL, LDLDIRECT in the last 72 hours. Thyroid Function Tests:  Recent Labs  09/18/16 1900  TSH 2.270   Anemia Panel:  Recent Labs  09/19/16 0058  VITAMINB12 371   Sepsis Labs: No results for input(s):  PROCALCITON, LATICACIDVEN in the last 168 hours.  Recent Results (from the past 240 hour(s))  Culture, blood (routine x 2) Call MD if unable to obtain prior to antibiotics being given     Status: None (Preliminary result)   Collection Time: 09/19/16 12:58 AM  Result Value Ref Range Status   Specimen Description BLOOD RIGHT ANTECUBITAL  Final   Special Requests IN PEDIATRIC BOTTLE Blood Culture adequate volume  Final   Culture   Final    NO GROWTH 1 DAY Performed at [facility], [address] [place], [place] [address]    Report Status PENDING  Incomplete  Culture, blood (routine x 2) Call MD if unable to obtain prior to antibiotics being given     Status:  None (Preliminary result)   Collection Time: 09/19/16 12:58 AM  Result Value Ref Range Status   Specimen Description BLOOD LEFT ANTECUBITAL  Final   Special Requests   Final    BOTTLES DRAWN AEROBIC ONLY Blood Culture adequate volume   Culture   Final    NO GROWTH 1 DAY Performed at [facility], [address] [place], [place] [address]    Report Status PENDING  Incomplete  Culture, sputum-assessment     Status: None   Collection Time: 09/19/16 12:13 PM  Result Value Ref Range Status   Specimen Description EXPECTORATED SPUTUM  Final   Special Requests NONE  Final   Sputum evaluation THIS SPECIMEN IS ACCEPTABLE FOR SPUTUM CULTURE  Final   Report Status 09/19/2016 FINAL  Final  Culture, respiratory (NON-Expectorated)     Status: None (Preliminary result)   Collection Time: 09/19/16 12:13 PM  Result Value Ref Range Status   Specimen Description EXPECTORATED SPUTUM  Final   Special Requests NONE Reflexed from [id number]  Final   Gram Stain PENDING  Incomplete   Culture   Final    CULTURE REINCUBATED FOR BETTER GROWTH Performed at [facility], [address], [place], [place] [address]    Report Status PENDING  Incomplete       Radiology Studies: Dg Chest 2 View  Result Date: 09/18/2016 CLINICAL DATA:   Cough.  Multiple falls. EXAM: CHEST  2 VIEW COMPARISON:  03/21/2010 FINDINGS: Cardiomegaly with vascular congestion. Mild interstitial prominence could reflect early edema. Bibasilar opacities, left greater than right, atelectasis versus infiltrates. No effusions or acute bony abnormality. IMPRESSION: Cardiomegaly with vascular congestion and possible early interstitial edema. Bibasilar atelectasis or infiltrates, left greater than right, atelectasis versus pneumonia. Electronically Signed   By: Charlett Nose M.D.   On: 09/18/2016 19:14      Scheduled Meds: . atorvastatin  40 mg Oral q1800  . carbidopa-levodopa  2 tablet Oral TID WC  . heparin  5,000 Units Subcutaneous Q8H  . [START ON 09/21/2016] Influenza vac split quadrivalent PF  0.5 mL Intramuscular Tomorrow-1000  . losartan  100 mg Oral Daily  . methylphenidate  10 mg Oral Daily  . multivitamin  1 tablet Oral Daily  . sodium chloride flush  3 mL Intravenous Q12H  . sodium chloride flush  3 mL Intravenous Q12H   Continuous Infusions: . sodium chloride    . cefTRIAXone (ROCEPHIN)  IV Stopped (09/19/16 2325)  . doxycycline (VIBRAMYCIN) IV 100 mg (09/20/16 1024)     LOS: 2 days    Time spent: 40 minutes   Noralee Stain, DO [facility] [email or web address] Password [email or web address] 09/20/2016, 1:13 PM

## 2016-09-20 NOTE — Progress Notes (Signed)
  Echocardiogram 2D Echocardiogram has been performed.  Ryan Cunningham, Ryan Cunningham 09/20/2016, 1:17 PM

## 2016-09-20 NOTE — Progress Notes (Signed)
Patient still remains to have mittens on for confusion. RT will not place patient on CPAP due to contrindications of restraints

## 2016-09-20 NOTE — Evaluation (Addendum)
Physical Therapy Evaluation Patient Details Name: Ryan Cunningham MRN: [id number] DOB: [birth year] Today's Date: 09/20/2016   History of Present Illness  83 yo male admitted wth Pna, acute encephalopathy. Hx of Parkinson's, HTN, bil foot ulcers, A fib, CKD, cellulitis, PVD.     Clinical Impression  On eval, pt required Mod assist +2 for mobility. He was able to perform a stand pivot x 2 with a RW. Bowel incontinence during session required total assist for hygiene performance. Pt was cooperative and participated fairly well during session. He presents with general weakness, decreased activity tolerance, and impaired gait and balance. Noted gait festination during transfer-hx of Parkinson's per chart. Recommend SNF for continued rehab. Will follow and progress activity as tolerated.     Follow Up Recommendations SNF (HHPT; 24 hour supervision/assist if family declines placement)    Equipment Recommendations  None recommended by PT    Recommendations for Other Services       Precautions / Restrictions Precautions Precautions: Fall Precaution Comments: can get agitated/combative Restrictions Weight Bearing Restrictions: No      Mobility  Bed Mobility Overal bed mobility: Needs Assistance Bed Mobility: Supine to Sit;Sit to Supine     Supine to sit: Mod assist;+2 for physical assistance;+2 for safety/equipment;HOB elevated Sit to supine: Mod assist;+2 for physical assistance;+2 for safety/equipment;HOB elevated   General bed mobility comments: Assist for trunk and LEs. Multimodal cueing required. Increased time.   Transfers Overall transfer level: Needs assistance Equipment used: Rolling walker (2 wheeled) Transfers: Sit to/from [employer]GI CorporationStand;Stand Pivot Transfers Sit to Stand: Mod assist;+2 physical assistance;+2 safety/equipment;From elevated surface Stand pivot transfers: Mod assist;+2 physical assistance;+2 safety/equipment;From elevated surface       General transfer comment:  Assist to rise, stabilize, maneuver with RW, control descent. Stand pivot x 2, bed<>bsc, with RW. Festination noted during transfers.   Ambulation/Gait Ambulation/Gait assistance: Mod assist;+2 physical assistance;+2 safety/equipment   Assistive device: Rolling walker (2 wheeled)       General Gait Details: 3-4 side steps along side of bed with RW. Required external guidance to encourage weightshifting and steps.   Stairs            Wheelchair Mobility    Modified Rankin (Stroke Patients Only)       Balance Overall balance assessment: Needs assistance         Standing balance support: Bilateral upper extremity supported Standing balance-Leahy Scale: Poor                               Pertinent Vitals/Pain Pain Assessment: No/denies pain    Home Living Family/patient expects to be discharged to:: Unsure Living Arrangements: Spouse/significant other Available Help at Discharge: Family                  Prior Function Level of Independence: Needs assistance   Gait / Transfers Assistance Needed: using cane for ambulation at baseline           Hand Dominance        Extremity/Trunk Assessment   Upper Extremity Assessment Upper Extremity Assessment: Generalized weakness    Lower Extremity Assessment Lower Extremity Assessment: Generalized weakness    Cervical / Trunk Assessment Cervical / Trunk Assessment: Kyphotic  Communication   Communication: No difficulties  Cognition Arousal/Alertness: Awake/alert Behavior During Therapy: WFL for tasks assessed/performed Overall Cognitive Status: History of cognitive impairments - at baseline Area of Impairment: Orientation;Attention;Memory;Following commands;Safety/judgement;Problem solving  Orientation Level: Disoriented to;Place;Time;Situation Current Attention Level: Focused   Following Commands: Follows one step commands inconsistently Safety/Judgement:  Decreased awareness of safety;Decreased awareness of deficits   Problem Solving: Slow processing;Decreased initiation;Difficulty sequencing;Requires verbal cues;Requires tactile cues        General Comments      Exercises     Assessment/Plan    PT Assessment Patient needs continued PT services  PT Problem List Decreased strength;Decreased mobility;Decreased activity tolerance;Decreased balance;Decreased knowledge of use of DME;Decreased cognition;Decreased safety awareness       PT Treatment Interventions DME instruction;Gait training;Therapeutic activities;Therapeutic exercise;Patient/family education;Balance training;Functional mobility training    PT Goals (Current goals can be found in the Care Plan section)  Acute Rehab PT Goals Patient Stated Goal: none stated.  PT Goal Formulation: Patient unable to participate in goal setting Time For Goal Achievement: 10/04/16 Potential to Achieve Goals: Fair    Frequency Min 3X/week   Barriers to discharge        Co-evaluation               AM-PAC PT "6 Clicks" Daily Activity  Outcome Measure Difficulty turning over in bed (including adjusting bedclothes, sheets and blankets)?: Unable Difficulty moving from lying on back to sitting on the side of the bed? : Unable Difficulty sitting down on and standing up from a chair with arms (e.g., wheelchair, bedside commode, etc,.)?: Unable Help needed moving to and from a bed to chair (including a wheelchair)?: A Lot Help needed walking in hospital room?: A Lot Help needed climbing 3-5 steps with a railing? : Total 6 Click Score: 8    End of Session Equipment Utilized During Treatment: Gait belt Activity Tolerance: Patient tolerated treatment well Patient left: in bed;with call bell/phone within reach;with family/visitor present;with bed alarm set   PT Visit Diagnosis: Muscle weakness (generalized) (M62.81);Difficulty in walking, not elsewhere classified (R26.2)    Time:  1345-1410 PT Time Calculation (min) (ACUTE ONLY): 25 min   Charges:   PT Evaluation $PT Eval Moderate Complexity: 1 Mod PT Treatments $Therapeutic Activity: 8-22 mins   PT G Codes:         Rebeca Alert, MPT Pager: [phone]

## 2016-09-21 ENCOUNTER — Emergency Department (HOSPITAL_COMMUNITY)
Admission: EM | Admit: 2016-09-21 | Discharge: 2016-09-21 | Disposition: A | Discharge status: Home / Self Care | Payer: Medicare Other | Attending: Emergency Medicine | Admitting: Emergency Medicine

## 2016-09-21 ENCOUNTER — Ambulatory Visit (INDEPENDENT_AMBULATORY_CARE_PROVIDER_SITE_OTHER): Payer: Medicare Other | Admitting: Orthopedic Surgery

## 2016-09-21 ENCOUNTER — Encounter (HOSPITAL_COMMUNITY): Payer: Self-pay | Admitting: Emergency Medicine

## 2016-09-21 ENCOUNTER — Emergency Department (HOSPITAL_COMMUNITY): Payer: Medicare Other

## 2016-09-21 DIAGNOSIS — S0181XA Laceration without foreign body of other part of head, initial encounter: Secondary | ICD-10-CM | POA: Diagnosis not present

## 2016-09-21 DIAGNOSIS — Z87891 Personal history of nicotine dependence: Secondary | ICD-10-CM | POA: Insufficient documentation

## 2016-09-21 DIAGNOSIS — Y999 Unspecified external cause status: Secondary | ICD-10-CM | POA: Insufficient documentation

## 2016-09-21 DIAGNOSIS — I509 Heart failure, unspecified: Secondary | ICD-10-CM | POA: Insufficient documentation

## 2016-09-21 DIAGNOSIS — W19XXXA Unspecified fall, initial encounter: Secondary | ICD-10-CM | POA: Insufficient documentation

## 2016-09-21 DIAGNOSIS — Y939 Activity, unspecified: Secondary | ICD-10-CM | POA: Insufficient documentation

## 2016-09-21 DIAGNOSIS — N189 Chronic kidney disease, unspecified: Secondary | ICD-10-CM | POA: Insufficient documentation

## 2016-09-21 DIAGNOSIS — Z79899 Other long term (current) drug therapy: Secondary | ICD-10-CM | POA: Insufficient documentation

## 2016-09-21 DIAGNOSIS — G9340 Encephalopathy, unspecified: Secondary | ICD-10-CM

## 2016-09-21 DIAGNOSIS — F0391 Unspecified dementia with behavioral disturbance: Secondary | ICD-10-CM | POA: Insufficient documentation

## 2016-09-21 DIAGNOSIS — Y92129 Unspecified place in nursing home as the place of occurrence of the external cause: Secondary | ICD-10-CM | POA: Insufficient documentation

## 2016-09-21 DIAGNOSIS — I130 Hypertensive heart and chronic kidney disease with heart failure and stage 1 through stage 4 chronic kidney disease, or unspecified chronic kidney disease: Secondary | ICD-10-CM | POA: Insufficient documentation

## 2016-09-21 LAB — CBC
HCT: 35.1 % — ABNORMAL LOW (ref 39.0–52.0)
HEMOGLOBIN: 11.3 g/dL — AB (ref 13.0–17.0)
MCH: 28.5 pg (ref 26.0–34.0)
MCHC: 32.2 g/dL (ref 30.0–36.0)
MCV: 88.6 fL (ref 78.0–100.0)
Platelets: 241 10*3/uL (ref 150–400)
RBC: 3.96 MIL/uL — AB (ref 4.22–5.81)
RDW: 15.2 % (ref 11.5–15.5)
WBC: 6.8 10*3/uL (ref 4.0–10.5)

## 2016-09-21 LAB — BASIC METABOLIC PANEL
ANION GAP: 7 (ref 5–15)
BUN: 21 mg/dL — ABNORMAL HIGH (ref 6–20)
CO2: 28 mmol/L (ref 22–32)
Calcium: 8.9 mg/dL (ref 8.9–10.3)
Chloride: 103 mmol/L (ref 101–111)
Creatinine, Ser: 1.01 mg/dL (ref 0.61–1.24)
GFR calc Af Amer: >60 mL/min (ref >60)
GLUCOSE: 151 mg/dL — AB (ref 65–99)
POTASSIUM: 4.3 mmol/L (ref 3.5–5.1)
Sodium: 138 mmol/L (ref 135–145)

## 2016-09-21 LAB — CBC WITH DIFFERENTIAL/PLATELET
BASOS ABS: 0 10*3/uL (ref 0.0–0.1)
Basophils Relative: 0 %
EOS PCT: 1 %
Eosinophils Absolute: 0.1 10*3/uL (ref 0.0–0.7)
HEMATOCRIT: 37.3 % — AB (ref 39.0–52.0)
Hemoglobin: 12.5 g/dL — ABNORMAL LOW (ref 13.0–17.0)
Lymphocytes Relative: 8 %
Lymphs Abs: 0.8 10*3/uL (ref 0.7–4.0)
MCH: 28.9 pg (ref 26.0–34.0)
MCHC: 33.5 g/dL (ref 30.0–36.0)
MCV: 86.3 fL (ref 78.0–100.0)
Monocytes Absolute: 0.9 10*3/uL (ref 0.1–1.0)
Monocytes Relative: 9 %
NEUTROS ABS: 7.9 10*3/uL — AB (ref 1.7–7.7)
Neutrophils Relative %: 82 %
PLATELETS: 254 10*3/uL (ref 150–400)
RBC: 4.32 MIL/uL (ref 4.22–5.81)
RDW: 14.9 % (ref 11.5–15.5)
WBC: 9.7 10*3/uL (ref 4.0–10.5)

## 2016-09-21 LAB — COMPREHENSIVE METABOLIC PANEL
ALBUMIN: 3 g/dL — AB (ref 3.5–5.0)
ALK PHOS: 133 U/L — AB (ref 38–126)
ALT: 12 U/L — ABNORMAL LOW (ref 17–63)
ANION GAP: 6 (ref 5–15)
AST: 23 U/L (ref 15–41)
BILIRUBIN TOTAL: 1.2 mg/dL (ref 0.3–1.2)
BUN: 20 mg/dL (ref 6–20)
CALCIUM: 8.5 mg/dL — AB (ref 8.9–10.3)
CO2: 26 mmol/L (ref 22–32)
Chloride: 107 mmol/L (ref 101–111)
Creatinine, Ser: 0.91 mg/dL (ref 0.61–1.24)
GLUCOSE: 103 mg/dL — AB (ref 65–99)
POTASSIUM: 3.9 mmol/L (ref 3.5–5.1)
Sodium: 139 mmol/L (ref 135–145)
TOTAL PROTEIN: 6.7 g/dL (ref 6.5–8.1)

## 2016-09-21 LAB — CBG MONITORING, ED: Glucose-Capillary: 126 mg/dL — ABNORMAL HIGH (ref 65–99)

## 2016-09-21 MED ORDER — CEFUROXIME AXETIL 500 MG PO TABS
500.0000 mg | ORAL_TABLET | Freq: Two times a day (BID) | ORAL | Status: DC
  Filled 2016-09-21: qty 1

## 2016-09-21 MED ORDER — CEFUROXIME AXETIL 500 MG PO TABS: 500.0000 mg | ORAL_TABLET | Freq: Two times a day (BID) | ORAL | 0 refills | Status: AC

## 2016-09-21 MED ORDER — LIDOCAINE HCL (PF) 1 % IJ SOLN: 10.0000 mL | Freq: Once | INTRAMUSCULAR | Status: DC

## 2016-09-21 NOTE — ED Triage Notes (Signed)
[facility] unwitnessed fall.  Pt has pressure barriers on his heels that may have contributed to the fall. Pt did not have railings up at facility.  On right arm he has old skin tears from his admission in which he was discharged today. Laceration noted to upper right forehead. Pt has hx of dementia and Parkinson's.

## 2016-09-21 NOTE — ED Provider Notes (Signed)
..  Laceration Repair Date/Time: 09/21/2016 10:05 PM Performed by: Dartha LodgeFORD, Derico Mitton N Authorized by: Dartha LodgeFORD, Mehkai Gallo N   Consent:    Consent obtained:  Verbal   Consent given by:  Patient   Risks discussed:  Need for additional repair, pain and infection Anesthesia (see MAR for exact dosages):    Anesthesia method:  None Laceration details:    Location:  Face   Face location:  Forehead   Length (cm):  3.5 Repair type:    Repair type:  Simple Exploration:    Hemostasis achieved with:  Direct pressure   Wound exploration: entire depth of wound probed and visualized   Treatment:    Area cleansed with:  Saline   Amount of cleaning:  Standard   Irrigation solution:  Sterile saline   Irrigation volume:  40mL    Irrigation method:  Syringe Skin repair:    Repair method:  Steri-Strips Approximation:    Approximation:  Close Post-procedure details:    Dressing:  Non-adherent dressing   Patient tolerance of procedure:  Tolerated well, no immediate complications      Dartha LodgeFord, Julias Mould N, [place]-C 09/21/16 2209

## 2016-09-21 NOTE — Progress Notes (Signed)
CMT notified pt's baseline heart rhythm in a-flutter with a bundle branch block, heart rate sustaining between 30s-40s. Pt fluctuating back and forth between his baseline rhythm and a ventricular rhythm where is HR gets up to 60s-70s. Pt asleep and resting at this time. VSS. On call MD Opyd made aware. Will continue to monitor closely.

## 2016-09-21 NOTE — Clinical Social Work Placement (Signed)
Pt transferring to [facility] for [facility]. [facility]. Report [phone] [facility] for [facility].  Pt will transport via [facility]- completed medical necessity form and arranged for transportation. Wife at bedside and agreeable. All information provided to facility via the HUB. See below for placement details   CLINICAL SOCIAL WORK PLACEMENT  NOTE  Date:  09/21/2016  Patient Details  Name: Ryan SpecterBrodie J Kon MRN: [id number] Date of Birth: 24-Feb-1927  Clinical Social Work is seeking post-discharge placement for this patient at the Skilled  Nursing Facility level of care (*CSW will initial, date and re-position this form in  chart as items are completed):  Yes   Patient/family provided with [place] Clinical Social Work Department's list of facilities offering this level of care within the geographic area requested by the patient (or if unable, by the patient's family).  Yes   Patient/family informed of their freedom to choose among providers that offer the needed level of care, that participate in Medicare, Medicaid or managed care program needed by the patient, have an available bed and are willing to accept the patient.  Yes   Patient/family informed of [facility]'s ownership interest in [facility] and [facility], as well as of the fact that they are under no obligation to receive care at these facilities.  PASRR submitted to EDS on       PASRR number received on 09/21/16     Existing PASRR number confirmed on       FL2 transmitted to all facilities in geographic area requested by pt/family on 09/21/16     FL2 transmitted to all facilities within larger geographic area on       Patient informed that his/her managed care company has contracts with or will negotiate with certain facilities, including the following:        Yes   Patient/family informed of bed offers received.  [facility]     Physician recommends and patient  [facility]    [facility] 09[facility].  Patient to be transferred to facility by [facility]     Patient family notified on 09/21/16 of transfer.  Name of family member notified:  wife [place]     PHYSICIAN       Additional Comment:    _______________________________________________ Nelwyn SalisburyMeghan R Rosemary Pentecost, LCSW 09/21/2016, 2:44 PM

## 2016-09-21 NOTE — Discharge Summary (Signed)
Physician Discharge Summary  Ryan Cunningham ZOX:[id number] DOB: April 27, 1927 DOA: 09/18/2016  PCP: Jarome Matin, MD  Admit date: 09/18/2016 Discharge date: 09/21/2016  Admitted From: Home Disposition:  SNF   Recommendations for Outpatient Follow-up:  1. Follow up with PCP in 1 week 2. Please follow up on the following pending results: final blood culture result  Discharge Condition: Stable CODE STATUS: DNR  Diet recommendation: Heart healthy   Brief/Interim Summary: Ryan Cunningham is a 83 yo male with Parkinson's disease, hypertension, hyperlipidemia, poorly healing foot ulcers followed by Dr. Lajoyce Corners presented to the ED with generalized weakness, productive cough and increasing confusion with hallucinations. Per family patient has had similar confusion associated with pneumonia in the past. In the ED he was afebrile with normal sats on room air but was bradycardic in the 40s. Chest x-ray showed pulmonary vascular congestion and atelectasis versus lung base pneumonia. Blood work showed WBC of 11 K, mildly elevated troponin of 0.04, BNP of 652, BUN of 29 and creatinine 1.3. UA was negative for infection.   He was treated for a left lobar pneumonia with Rocephin and doxycycline. Mentation continued to improve throughout hospitalization, although confused intermittently. Patient has questionable developing dementia as well. Echocardiogram was completed due to questionable heart failure, BNP 651, bilateral ankle edema; echocardiogram revealed preserved ejection fraction. Physical therapy evaluated patient, recommended skilled nursing facility on discharge.  Discharge Diagnoses:  Principal Problem:   CAP (community acquired pneumonia) Active Problems:   Bradycardia   Parkinson disease (HCC)   HTN (hypertension)   OSA (obstructive sleep apnea)   Acute encephalopathy   Anemia   Hypervolemia   Elevated troponin   Foot ulcer (HCC)  Acute metabolic encephalopathy -Has history of Parkinson's  disease, per family patient does have some intermittent hallucinations and confusion. Has been more forgetful recently, even forgetting his wife at times. Often worsens in the setting of infection such as pneumonia -Much improved, continue supportive care  Left lobar pneumonia -Sputum culture with normal flora -Blood culture negative to date -Change antibiotics to ceftin for 5 more days, last day 9/18   Sinus bradycardia with a fib -Apparently has been evaluated by cardiology in the past, pacemaker was considered, but deferred at the time as patient had been asymptomatic from this. Patient's blood pressure is stable. Continue to monitor on telemetry. Telemetry changes consistent with EKG from back in 2013-2015. Rate has consistently been in the 40s without symptoms, normal BP   Parkinson's disease -Continue Sinemet  Essential hypertension -Continue losartan  Elevated troponin -0.04, 0.04, 0.03 -Secondary to demand ischemia  Bilateral foot ulcers -Stage III right medial heel pressure injury and full-thickness plantar left foot ulcer, present on admission -Wound care consult -Follow up with Dr. Lajoyce Corners   Discharge Instructions  Discharge Instructions    Call MD for:  difficulty breathing, headache or visual disturbances    Complete by:  As directed    Call MD for:  extreme fatigue    Complete by:  As directed    Call MD for:  hives    Complete by:  As directed    Call MD for:  persistant dizziness or light-headedness    Complete by:  As directed    Call MD for:  persistant nausea and vomiting    Complete by:  As directed    Call MD for:  severe uncontrolled pain    Complete by:  As directed    Call MD for:  temperature >100.4    Complete by:  As directed    Diet - low sodium heart healthy    Complete by:  As directed    Discharge instructions    Complete by:  As directed    You were cared for by a hospitalist during your hospital stay. If you have any questions about  your discharge medications or the care you received while you were in the hospital after you are discharged, you can call the unit and asked to speak with the hospitalist on call if the hospitalist that took care of you is not available. Once you are discharged, your primary care physician will handle any further medical issues. Please note that NO REFILLS for any discharge medications will be authorized once you are discharged, as it is imperative that you return to your primary care physician (or establish a relationship with a primary care physician if you do not have one) for your aftercare needs so that they can reassess your need for medications and monitor your lab values.   Increase activity slowly    Complete by:  As directed      Allergies as of 09/21/2016      Reactions   Penicillins Rash   Has patient had a PCN reaction causing immediate rash, facial/tongue/throat swelling, SOB or lightheadedness with hypotension: Yes Has patient had a PCN reaction causing severe rash involving mucus membranes or skin necrosis: No Has patient had a PCN reaction that required hospitalization: No Has patient had a PCN reaction occurring within the last 10 years: No If all of the above answers are "NO", then may proceed with Cephalosporin use.      Medication List    STOP taking these medications   methylphenidate 10 MG tablet Commonly known as:  RITALIN     TAKE these medications   carbidopa-levodopa 10-100 MG tablet Commonly known as:  SINEMET IR Take 2 tablets by mouth 3 (three) times daily. To help tremor.   cefUROXime 500 MG tablet Commonly known as:  CEFTIN Take 1 tablet (500 mg total) by mouth 2 (two) times daily with a meal.   ICAPS AREDS FORMULA PO Take 1 tablet by mouth daily.   ipratropium 0.03 % nasal spray Commonly known as:  ATROVENT 1 spray as directed. 1 spray into mouth up to three times daily as directed to lessen drooling.   losartan 100 MG tablet Commonly known as:   COZAAR Take 100 mg by mouth daily.   simvastatin 80 MG tablet Commonly known as:  ZOCOR Take one half tablet by mouth daily to lower cholesterol.   SSD 1 % cream Generic drug:  silver sulfADIAZINE Apply 1 application topically 2 (two) times daily as needed (foot ulcer).            Discharge Care Instructions        Start     Ordered   09/21/16 0000  cefUROXime (CEFTIN) 500 MG tablet  2 times daily with meals     09/21/16 1152   09/21/16 0000  Increase activity slowly     09/21/16 1152   09/21/16 0000  Diet - low sodium heart healthy     09/21/16 1152   09/21/16 0000  Discharge instructions    Comments:  You were cared for by a hospitalist during your hospital stay. If you have any questions about your discharge medications or the care you received while you were in the hospital after you are discharged, you can call the unit and asked to speak with the hospitalist  on call if the hospitalist that took care of you is not available. Once you are discharged, your primary care physician will handle any further medical issues. Please note that NO REFILLS for any discharge medications will be authorized once you are discharged, as it is imperative that you return to your primary care physician (or establish a relationship with a primary care physician if you do not have one) for your aftercare needs so that they can reassess your need for medications and monitor your lab values.   09/21/16 1152   09/21/16 0000  Call MD for:  temperature >100.4     09/21/16 1152   09/21/16 0000  Call MD for:  persistant nausea and vomiting     09/21/16 1152   09/21/16 0000  Call MD for:  severe uncontrolled pain     09/21/16 1152   09/21/16 0000  Call MD for:  extreme fatigue     09/21/16 1152   09/21/16 0000  Call MD for:  persistant dizziness or light-headedness     09/21/16 1152   09/21/16 0000  Call MD for:  hives     09/21/16 1152   09/21/16 0000  Call MD for:  difficulty breathing, headache or  visual disturbances     09/21/16 1152     Follow-up Information    Jarome Matin, MD. Schedule an appointment as soon as possible for a visit in 1 week(s).   Specialty:  Internal Medicine Contact information: [address] [place] [address] [phone]          Allergies  Allergen Reactions  . Penicillins Rash    Has patient had a PCN reaction causing immediate rash, facial/tongue/throat swelling, SOB or lightheadedness with hypotension: Yes Has patient had a PCN reaction causing severe rash involving mucus membranes or skin necrosis: No Has patient had a PCN reaction that required hospitalization: No Has patient had a PCN reaction occurring within the last 10 years: No If all of the above answers are "NO", then may proceed with Cephalosporin use.     Consultations:  None   Procedures/Studies: Dg Chest 2 View  Result Date: 09/18/2016 CLINICAL DATA:  Cough.  Multiple falls. EXAM: CHEST  2 VIEW COMPARISON:  03/21/2010 FINDINGS: Cardiomegaly with vascular congestion. Mild interstitial prominence could reflect early edema. Bibasilar opacities, left greater than right, atelectasis versus infiltrates. No effusions or acute bony abnormality. IMPRESSION: Cardiomegaly with vascular congestion and possible early interstitial edema. Bibasilar atelectasis or infiltrates, left greater than right, atelectasis versus pneumonia. Electronically Signed   By: Charlett Nose M.D.   On: 09/18/2016 19:14     Echo  Study Conclusions  - Left ventricle: The cavity size was normal. Wall thickness was   increased in a pattern of mild LVH. Systolic function was normal.   The estimated ejection fraction was in the range of 50% to 55%.   Wall motion was normal; there were no regional wall motion   abnormalities. - Aortic valve: Trileaflet; mildly thickened, mildly calcified   leaflets. There was trivial regurgitation. - Mitral valve: There was mild to moderate regurgitation. - Left  atrium: The atrium was moderately dilated. - Right ventricle: The cavity size was mildly dilated. Wall   thickness was normal. - Right atrium: The atrium was mildly dilated. - Tricuspid valve: There was mild regurgitation. - Pulmonic valve: There was moderate regurgitation. - Pulmonary arteries: Systolic pressure was mildly increased. PA peak pressure: 40 mm Hg (S).    Discharge Exam: Vitals:  09/21/16 0623 09/21/16 0805  BP: (!) 176/65 (!) 164/57  Pulse: (!) 47 (!) 44  Resp: 18   Temp: 97.6 F (36.4 C)   SpO2:     Vitals:   09/20/16 2134 09/21/16 0500 09/21/16 0623 09/21/16 0805  BP: (!) 156/61  (!) 176/65 (!) 164/57  Pulse: (!) 42  (!) 47 (!) 44  Resp: 18  18   Temp: 99.1 F (37.3 C)  97.6 F (36.4 C)   TempSrc: Oral  Oral   SpO2: 97%     Weight:  85.2 kg (187 lb 13.3 oz)    Height:        General: Pt is alert, awake, not in acute distress Cardiovascular: Bradycardic rate 40s, S1/S2 + Respiratory: CTA bilaterally, no wheezing, no rhonchi Abdominal: Soft, NT, ND, bowel sounds + Extremities: no edema, no cyanosis Skin: bilateral prevlon boots in place Neuro: nonfocal, mild confusion     The results of significant diagnostics from this hospitalization (including imaging, microbiology, ancillary and laboratory) are listed below for reference.     Microbiology: Recent Results (from the past 240 hour(s))  Culture, blood (routine x 2) Call MD if unable to obtain prior to antibiotics being given     Status: None (Preliminary result)   Collection Time: 09/19/16 12:58 AM  Result Value Ref Range Status   Specimen Description BLOOD RIGHT ANTECUBITAL  Final   Special Requests IN PEDIATRIC BOTTLE Blood Culture adequate volume  Final   Culture   Final    NO GROWTH 2 DAYS Performed at [facility], [address] [place], [place] [address]    Report Status PENDING  Incomplete  Culture, blood (routine x 2) Call MD if unable to obtain prior to antibiotics being given      Status: None (Preliminary result)   Collection Time: 09/19/16 12:58 AM  Result Value Ref Range Status   Specimen Description BLOOD LEFT ANTECUBITAL  Final   Special Requests   Final    BOTTLES DRAWN AEROBIC ONLY Blood Culture adequate volume   Culture   Final    NO GROWTH 2 DAYS Performed at [facility], [address], [place], [place] [address]    Report Status PENDING  Incomplete  Culture, sputum-assessment     Status: None   Collection Time: 09/19/16 12:13 PM  Result Value Ref Range Status   Specimen Description EXPECTORATED SPUTUM  Final   Special Requests NONE  Final   Sputum evaluation THIS SPECIMEN IS ACCEPTABLE FOR SPUTUM CULTURE  Final   Report Status 09/19/2016 FINAL  Final  Culture, respiratory (NON-Expectorated)     Status: None (Preliminary result)   Collection Time: 09/19/16 12:13 PM  Result Value Ref Range Status   Specimen Description EXPECTORATED SPUTUM  Final   Special Requests NONE Reflexed from [id number]  Final   Gram Stain   Final    MODERATE WBC PRESENT, PREDOMINANTLY PMN RARE SQUAMOUS EPITHELIAL CELLS PRESENT MODERATE GRAM POSITIVE COCCI IN PAIRS    Culture   Final    FEW Consistent with normal respiratory flora. Performed at [facility], [address] [place], [place] [address]    Report Status PENDING  Incomplete     Labs: BNP (last 3 results)  Recent Labs  09/18/16 1909  BNP 651.8*   Basic Metabolic Panel:  Recent Labs Lab 09/18/16 1908 09/19/16 0058 09/21/16 0422  NA 137 136 139  K 4.5 4.1 3.9  CL 103 104 107  CO2 25  23 26  GLUCOSE 118* 140* 103*  BUN 29* 27* 20  CREATININE 1.23 1.11 0.91  CALCIUM 8.9 8.6* 8.5*   Liver Function Tests:  Recent Labs Lab 09/18/16 1908 09/19/16 0058 09/21/16 0422  AST ALT 16* 24 12*  ALKPHOS 142* 129* 133*  BILITOT 1.3* 1.4* 1.2  PROT 7.9 7.5 6.7  ALBUMIN 3.9 3.6 3.0*   No results for input(s): LIPASE, AMYLASE in the last 168 hours.  Recent Labs Lab  09/19/16 0058  AMMONIA 11   CBC:  Recent Labs Lab 09/18/16 1908 09/19/16 0058 09/21/16 0422  WBC 11.1* 10.4 6.8  NEUTROABS  --  8.0*  --   HGB 12.1* 11.3* 11.3*  HCT 36.4* 34.6*  35.0* 35.1*  MCV 88.6 88.3 88.6  PLT 227 214 241   Cardiac Enzymes:  Recent Labs Lab 09/18/16 1908 09/19/16 0058 09/19/16 0555  TROPONINI 0.04* 0.04* 0.03*   BNP: Invalid input(s): POCBNP CBG:  Recent Labs Lab 09/18/16 1819  GLUCAP 108*   D-Dimer No results for input(s): DDIMER in the last 72 hours. Hgb A1c No results for input(s): HGBA1C in the last 72 hours. Lipid Profile No results for input(s): CHOL, HDL, LDLCALC, TRIG, CHOLHDL, LDLDIRECT in the last 72 hours. Thyroid function studies  Recent Labs  09/18/16 1900  TSH 2.270   Anemia work up  Recent Labs  09/19/16 0058  VITAMINB12 371   Urinalysis    Component Value Date/Time   COLORURINE YELLOW 09/18/2016 1843   APPEARANCEUR CLEAR 09/18/2016 1843   LABSPEC 1.019 09/18/2016 1843   PHURINE 5.0 09/18/2016 1843   GLUCOSEU NEGATIVE 09/18/2016 1843   HGBUR NEGATIVE 09/18/2016 1843   BILIRUBINUR NEGATIVE 09/18/2016 1843   KETONESUR 5 (A) 09/18/2016 1843   PROTEINUR 30 (A) 09/18/2016 1843   UROBILINOGEN 1.0 03/21/2010 2342   NITRITE NEGATIVE 09/18/2016 1843   LEUKOCYTESUR NEGATIVE 09/18/2016 1843   Sepsis Labs Invalid input(s): PROCALCITONIN,  WBC,  LACTICIDVEN Microbiology Recent Results (from the past 240 hour(s))  Culture, blood (routine x 2) Call MD if unable to obtain prior to antibiotics being given     Status: None (Preliminary result)   Collection Time: 09/19/16 12:58 AM  Result Value Ref Range Status   Specimen Description BLOOD RIGHT ANTECUBITAL  Final   Special Requests IN PEDIATRIC BOTTLE Blood Culture adequate volume  Final   Culture   Final    NO GROWTH 2 DAYS Performed at [facility], [address] [place], [place] [address]    Report Status PENDING  Incomplete  Culture, blood (routine  x 2) Call MD if unable to obtain prior to antibiotics being given     Status: None (Preliminary result)   Collection Time: 09/19/16 12:58 AM  Result Value Ref Range Status   Specimen Description BLOOD LEFT ANTECUBITAL  Final   Special Requests   Final    BOTTLES DRAWN AEROBIC ONLY Blood Culture adequate volume   Culture   Final    NO GROWTH 2 DAYS Performed at [facility], [address], [place], [place] [address]    Report Status PENDING  Incomplete  Culture, sputum-assessment     Status: None   Collection Time: 09/19/16 12:13 PM  Result Value Ref Range Status   Specimen Description EXPECTORATED SPUTUM  Final   Special Requests NONE  Final   Sputum evaluation THIS SPECIMEN IS ACCEPTABLE FOR SPUTUM CULTURE  Final   Report Status 09/19/2016 FINAL  Final  Culture, respiratory (NON-Expectorated)  Status: None (Preliminary result)   Collection Time: 09/19/16 12:13 PM  Result Value Ref Range Status   Specimen Description EXPECTORATED SPUTUM  Final   Special Requests NONE Reflexed from [id number]  Final   Gram Stain   Final    MODERATE WBC PRESENT, PREDOMINANTLY PMN RARE SQUAMOUS EPITHELIAL CELLS PRESENT MODERATE GRAM POSITIVE COCCI IN PAIRS    Culture   Final    FEW Consistent with normal respiratory flora. Performed at [facility], [address], [place], [place] [address]    Report Status PENDING  Incomplete     Time coordinating discharge: 40 minutes  SIGNED:  Noralee Stain, DO [facility] Pager [phone]  If 7PM-7AM, please contact night-coverage [email or web address] Password [email or web address] 09/21/2016, 12:01 PM

## 2016-09-21 NOTE — Progress Notes (Signed)
[facility] All drsg changes completed prior to transfer. Maeola Harmanark, Charlottie Peragine Johnson

## 2016-09-21 NOTE — Clinical Social Work Note (Signed)
Clinical Social Work Assessment  Patient Details  Name: Ryan Cunningham MRN: [id number] Date of Birth: 02-11-1927  Date of referral:  09/21/16               Reason for consult:  Facility Placement                Permission sought to share information with:  Family Supports Permission granted to share information::  Yes, Verbal Permission Granted  Name::     Wife Haematologist::     Relationship::     Contact Information:     Housing/Transportation Living arrangements for the past 2 months:  Single Family Home Source of Information:  Adult Children, Spouse, Patient Patient Interpreter Needed:  None Criminal Activity/Legal Involvement Pertinent to Current Situation/Hospitalization:  No - Comment as needed Significant Relationships:  Adult Children, Other Family Members, Spouse Lives with:  Spouse Do you feel safe going back to the place where you live?  Yes Need for family participation in patient care:  Yes (Comment) (Pt confused- wife main decision maker)  Care giving concerns:  Pt from home where he resides with wife. Has Parkinson's disease. At baseline "gets around the house okay on his own, sometimes uses a cane."   Facilities manager / plan:  CSW received consult to assist with SNF placement for pt ready for DC today. Met with pt and wife at bedside- discussed recommendation for ST rehab. Both agreed to referrals.  Obtained PASSR, completed FL2 and made referrals. Preference for [facility]. [facility] informed CSW no bed availability today. Family selects [facility] and provided all information. Confirmed bed availability with admissions.  Plan: Transfer to SNF today.  Employment status:  Retired Nurse, adult PT Recommendations:  [place] / Referral to community resources:  [place]  Patient/Family's Response to care:  Appreciative of care  Patient/Family's  Understanding of and Emotional Response to Diagnosis, Current Treatment, and Prognosis:  Pt shows understanding of where he is and plan to go to a reahb facility but is otherwise confused. Nonetheless he is pleasant and positive in his interactions with CSW and staff but conversation not always relevent. Wife/medical team report concern that pt is beginning to show signs of dementia. Wife demonstrates adequate understanding and was hopeful pt "will be able to get stronger after a couple of weeks and rehab and come home."  Emotional Assessment Appearance:  Appears stated age Attitude/Demeanor/Rapport:   (unremarkable) Affect (typically observed):  Pleasant Orientation:  Oriented to Self, Oriented to Place Alcohol / Substance use:  Not Applicable Psych involvement (Current and /or in the community):  No (Comment)  Discharge Needs  Concerns to be addressed:  Discharge Planning Concerns Readmission within the last 30 days:  No Current discharge risk:  None Barriers to Discharge:  No Barriers Identified   Nila Nephew, LCSW 09/21/2016, 2:33 PM  [phone]

## 2016-09-21 NOTE — ED Notes (Signed)
[facility] Expected date:  Expected time:  Means of arrival:  Comments: EMS 83 y/o fall, forehead lac

## 2016-09-21 NOTE — Care Management Note (Signed)
Case Management Note  Patient Details  Name: Ryan Cunningham MRN: [id number] Date of Birth: [birth year]  Subjective/Objective:     CAP               Action/Plan: Plan to discharge to SNF  Expected Discharge Date:  09/21/16               Expected Discharge Plan:  Skilled Nursing Facility  In-House Referral:  Clinical Social Work  Discharge planning Services  CM Consult  Post Acute Care Choice:    Choice offered to:     DME Arranged:    DME Agency:     HH Arranged:    HH Agency:     Status of Service:  Completed, signed off  If discussed at [employer] Meetings, dates discussed:    Additional CommentsGeni Bers:  [name], RN 09/21/2016, 2:32 PM

## 2016-09-21 NOTE — NC FL2 (Signed)
[place] MEDICAID FL2 LEVEL OF CARE SCREENING TOOL     IDENTIFICATION  Patient Name: Ryan Cunningham Birthdate: 11-Jun-1927 Sex: male Admission Date (Current Location): 09/18/2016  [facility] and [place] Number:  Producer, television/film/video and Address:  [facility],  [address] [place], [place] [address]      Provider Number: [id number]  Attending Physician Name and Address:  Noralee Stain Chahn-Yan*  Relative Name and Phone Number:       Current Level of Care: Hospital Recommended Level of Care: Skilled Nursing Facility Prior Approval Number:    Date Approved/Denied:   PASRR Number:   [id number]    Discharge Plan: SNF    Current Diagnoses: Patient Active Problem List   Diagnosis Date Noted  . Acute encephalopathy 09/18/2016  . Anemia 09/18/2016  . Hypervolemia 09/18/2016  . Elevated troponin 09/18/2016  . Foot ulcer (HCC) 09/18/2016  . CAP (community acquired pneumonia) 09/18/2016  . Diabetic ulcer of left midfoot associated with type 2 diabetes mellitus, limited to breakdown of skin (HCC) 08/01/2016  . Idiopathic chronic venous hypertension of both lower extremities with inflammation 08/01/2016  . Hyperlipidemia LDL goal <100 08/05/2014  . Chronic atrial fibrillation (HCC) 08/05/2014  . Essential hypertension 08/05/2014  . Diabetes mellitus with renal manifestations, controlled (HCC) 08/05/2014  . Diabetes mellitus with renal complications (HCC) 07/23/2013  . Diabetic polyneuropathy associated with type 2 diabetes mellitus (HCC) 07/23/2013  . CKD (chronic kidney disease), symptom management only 07/23/2013  . Impacted cerumen 07/23/2013  . 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 A-fib (HCC) 01/22/2013  . RBBB 01/22/2013  . OSA (obstructive sleep apnea) 01/22/2013  . Prediabetes 07/30/2012  . Bradycardia 04/03/2012  . Parkinson disease (HCC) 04/03/2012  . HTN (hypertension) 04/03/2012  . Other and unspecified  hyperlipidemia 04/03/2012  . CKD (chronic kidney disease) 04/03/2012    Orientation RESPIRATION BLADDER Height & Weight     Self, Place  Normal Indwelling catheter Weight: 187 lb 13.3 oz (85.2 kg) Height:   (188 cm)  BEHAVIORAL SYMPTOMS/MOOD NEUROLOGICAL BOWEL NUTRITION STATUS      Continent Diet (low sodium heart healthy)  AMBULATORY STATUS COMMUNICATION OF NEEDS Skin     Verbally Other (Comment)     Stage III right medial heel pressure injury and full-thickness plantar left foot ulcer Measurement:Right medial heel:  2cm x 1.5cm x 0.2cm with pink, moist wound bed, scant serous drainage Left plantar foot:  2cm x 1.8cm x 0.2cm with red, moist and hypergranulating wound bed, small to moderate amount of serous drainage Wound bed:As described above Drainage (amount, consistency, odor) As described above Periwound:intact, with some hygiene issues Dressing procedure/placement/frequency: I have ordered conservative (saline moistened gauze dressings) to be applied/changed twice daily.  Feet are to be placed in pressure redistribution heel boots. Turning and repositioning are in place as is incontinence care using our house products.    Non pressure injury left knee- skin tear               Personal Care Assistance Level of Assistance              Functional Limitations Info  Sight, Hearing, Speech Sight Info: Adequate Hearing Info: Impaired (hard of hearing) Speech Info: Adequate    SPECIAL CARE FACTORS FREQUENCY  PT (By licensed PT), OT (By licensed OT)     PT Frequency: 5x OT Frequency: 5x            Contractures Contractures Info: Not  present    Additional Factors Info  Code Status, Allergies Code Status Info: DNR Allergies Info: penicillins           Current Medications (09/21/2016):  This is the current hospital active medication list Current Facility-Administered Medications  Medication Dose Route Frequency Provider Last Rate Last Dose  . 0.9 %   sodium chloride infusion  250 mL Intravenous PRN Opyd, Lavone Neriimothy S, MD      . acetaminophen (TYLENOL) tablet 650 mg  650 mg Oral Q6H PRN Opyd, Lavone Neriimothy S, MD       Or  . acetaminophen (TYLENOL) suppository 650 mg  650 mg Rectal Q6H PRN Opyd, Lavone Neriimothy S, MD      . albuterol (PROVENTIL) (2.5 MG/3ML) 0.083% nebulizer solution 2.5 mg  2.5 mg Nebulization Q4H PRN Opyd, Lavone Neriimothy S, MD      . atorvastatin (LIPITOR) tablet 40 mg  40 mg Oral q1800 Opyd, Lavone Neriimothy S, MD   40 mg at 09/20/16 1725  . bisacodyl (DULCOLAX) EC tablet 5 mg  5 mg Oral Daily PRN Opyd, Lavone Neriimothy S, MD      . carbidopa-levodopa (SINEMET IR) 10-100 MG per tablet immediate release 2 tablet  2 tablet Oral TID WC Opyd, Lavone Neriimothy S, MD   2 tablet at 09/21/16 0801  . cefUROXime (CEFTIN) tablet 500 mg  500 mg Oral BID WC Noralee Stainhoi, Jennifer Chahn-Yang, DO      . doxycycline (VIBRAMYCIN) 100 mg in dextrose 5 % 250 mL IVPB  100 mg Intravenous Q12H Dhungel, Nishant, MD 125 mL/hr at 09/21/16 0924 100 mg at 09/21/16 0924  . heparin injection 5,000 Units  5,000 Units Subcutaneous Q8H Opyd, Lavone Neriimothy S, MD   5,000 Units at 09/20/16 2216  . hydrALAZINE (APRESOLINE) injection 10 mg  10 mg Intravenous Q4H PRN Briscoe Deutscherpyd, Timothy S, MD   10 mg at 09/21/16 0659  . HYDROcodone-acetaminophen (NORCO/VICODIN) 5-325 MG per tablet 1-2 tablet  1-2 tablet Oral Q4H PRN Opyd, Lavone Neriimothy S, MD   1 tablet at 09/20/16 2226  . losartan (COZAAR) tablet 100 mg  100 mg Oral Daily Opyd, Lavone Neriimothy S, MD   100 mg at 09/21/16 0927  . methylphenidate (RITALIN) tablet 10 mg  10 mg Oral Daily Opyd, Lavone Neriimothy S, MD   10 mg at 09/21/16 1029  . multivitamin (PROSIGHT) tablet 1 tablet  1 tablet Oral Daily Opyd, Lavone Neriimothy S, MD   1 tablet at 09/21/16 0927  . ondansetron (ZOFRAN) tablet 4 mg  4 mg Oral Q6H PRN Opyd, Lavone Neriimothy S, MD       Or  . ondansetron (ZOFRAN) injection 4 mg  4 mg Intravenous Q6H PRN Opyd, Lavone Neriimothy S, MD      . senna-docusate (Senokot-S) tablet 1 tablet  1 tablet Oral QHS PRN Opyd, Lavone Neriimothy S, MD       . sodium chloride flush (NS) 0.9 % injection 3 mL  3 mL Intravenous Q12H Opyd, Lavone Neriimothy S, MD   3 mL at 09/18/16 2236  . sodium chloride flush (NS) 0.9 % injection 3 mL  3 mL Intravenous Q12H Opyd, Lavone Neriimothy S, MD   3 mL at 09/20/16 1025  . sodium chloride flush (NS) 0.9 % injection 3 mL  3 mL Intravenous PRN Opyd, Lavone Neriimothy S, MD         Discharge Medications: Please see discharge summary for a list of discharge medications.  Relevant Imaging Results:  Relevant Lab Results:   Additional Information ss# [id number]  Nelwyn SalisburyMeghan R Dontee Jaso, LCSW

## 2016-09-21 NOTE — ED Notes (Signed)
[facility] called for transport.  

## 2016-09-21 NOTE — Care Management Important Message (Signed)
Important Message  Patient Details  Name: Ryan Cunningham MRN: [id number] Date of Birth: Feb 04, 1927   Medicare Important Message Given:  Yes    Caren MacadamFuller, Anani Gu 09/21/2016, 10:50 AMImportant Message  Patient Details  Name: Ryan Cunningham MRN: [id number] Date of Birth: Feb 04, 1927   Medicare Important Message Given:  Yes    Caren MacadamFuller, Conn Trombetta 09/21/2016, 10:50 AM

## 2016-09-21 NOTE — Discharge Instructions (Signed)
REturn to the emergency room as needed for worsening symptoms, monitor for fever vomiting or other concerns

## 2016-09-21 NOTE — ED Provider Notes (Signed)
[facility]-EMERGENCY DEPT Provider Note   CSN: [id number] Arrival date & time: 09/21/16  1921   Level V caveat: Dementia  History   Chief Complaint Chief Complaint  Patient presents with  . Fall  . Head Injury    HPI CRISTINA MATTERN is a 83 y.o. male.  HPI Patient presents to the emergency room for evaluation after a fall.  Patient just arrived at the nursing facility after being discharged from the hospital today..  the patient was discharged after being admitted on September 10 for pneumonia and congestive heart failure .  According to the nursing facility the patient had an unwitnessed fall. The patient has pressure barrier splints on his lower extremities. The railings were not up at the bed at his facility and they think the patient may have tried to get out of bed and fall.  Patient was noted to sustain a laceration to his forehead. I placed a bandage and sent him to the emergency room.  EMS noted the patient had a dressing on his right forearm.  Patient apparently had some skin tears on his arm prior to being discharged from the hospital. They did not note any other injuries to his extremities.  Patient is confused. He speaks to me but is not really able to give me any intelligible answers.  Past Medical History:  Diagnosis Date  . Acute bronchitis   . Acute bronchitis   . Acute maxillary sinusitis   . Atrial fibrillation (HCC)   . Cardiomegaly   . Cellulitis and abscess of foot, except toes   . Cervicalgia   . Chronic kidney disease, unspecified   . Congestive heart failure, unspecified   . Coronary atherosclerosis of unspecified type of vessel, native or graft   . Edema   . Encounter for long-term (current) use of other medications   . Essential and other specified forms of tremor   . Impotence of organic origin   . Obstructive sleep apnea (adult) (pediatric)   . Other and unspecified hyperlipidemia   . Other atopic dermatitis and related conditions   . Other specified  idiopathic peripheral neuropathy   . Paralysis agitans (HCC)   . Peripheral vascular disease, unspecified (HCC)   . Pressure ulcer, other site(707.09)   . Right bundle branch block and left anterior fascicular block   . Seborrhea   . Syncope and collapse   . Type II or unspecified type diabetes mellitus without mention of complication, not stated as uncontrolled   . Ulcer of lower limb, unspecified   . Ulcer of other part of foot   . Unspecified essential hypertension   . Urinary frequency     Patient Active Problem List   Diagnosis Date Noted  . Acute encephalopathy 09/18/2016  . Anemia 09/18/2016  . Hypervolemia 09/18/2016  . Elevated troponin 09/18/2016  . Foot ulcer (HCC) 09/18/2016  . CAP (community acquired pneumonia) 09/18/2016  . Diabetic ulcer of left midfoot associated with type 2 diabetes mellitus, limited to breakdown of skin (HCC) 08/01/2016  . Idiopathic chronic venous hypertension of both lower extremities with inflammation 08/01/2016  . Hyperlipidemia LDL goal <100 08/05/2014  . Chronic atrial fibrillation (HCC) 08/05/2014  . Essential hypertension 08/05/2014  . Diabetes mellitus with renal manifestations, controlled (HCC) 08/05/2014  . Diabetes mellitus with renal complications (HCC) 07/23/2013  . Diabetic polyneuropathy associated with type 2 diabetes mellitus (HCC) 07/23/2013  . CKD (chronic kidney disease), symptom management only 07/23/2013  . Impacted cerumen 07/23/2013  . Dementia due to  Parkinson's disease without behavioral disturbance (HCC) 07/23/2013  . A-fib (HCC) 01/22/2013  . RBBB 01/22/2013  . OSA (obstructive sleep apnea) 01/22/2013  . Prediabetes 07/30/2012  . Bradycardia 04/03/2012  . Parkinson disease (HCC) 04/03/2012  . HTN (hypertension) 04/03/2012  . Other and unspecified hyperlipidemia 04/03/2012  . CKD (chronic kidney disease) 04/03/2012    Past Surgical History:  Procedure Laterality Date  . BACK SURGERY  1991   Dr.Ames   .  CATARACT EXTRACTION, BILATERAL  2008   Dr.Hecker   . CERVICAL DISCECTOMY  2004   Dr.Kritzer  . CERVICAL SPINE SURGERY  1988   Dr.Nudelman  . COLONOSCOPY  06/08/2009   Dr.John Madilyn Fireman   . KNEE SURGERY  1974  . LUMBAR SPINE SURGERY  1970  . LUMBAR SPINE SURGERY  1988  . SHOULDER SURGERY  1988  . TONSILLECTOMY  1981       Home Medications    Prior to Admission medications   Medication Sig Start Date End Date Taking? Authorizing Provider  carbidopa-levodopa (SINEMET IR) 10-100 MG per tablet Take 2 tablets by mouth 3 (three) times daily. To help tremor. 08/05/14  Yes Montez Morita, Monica, DO  losartan (COZAAR) 100 MG tablet Take 100 mg by mouth daily.  07/18/16  Yes [provider]  Multiple Vitamins-Minerals (ICAPS AREDS FORMULA PO) Take 1 tablet by mouth daily.   Yes [provider]  simvastatin (ZOCOR) 80 MG tablet Take one half tablet by mouth daily to lower cholesterol. 08/05/14  Yes Kirt Boys, DO  cefUROXime (CEFTIN) 500 MG tablet Take 1 tablet (500 mg total) by mouth 2 (two) times daily with a meal. 09/21/16 09/26/16  Noralee Stain Chahn-Yang, DO  ipratropium (ATROVENT) 0.03 % nasal spray 1 spray as directed. 1 spray into mouth up to three times daily as directed to lessen drooling. 08/10/16   [provider]  SSD 1 % cream Apply 1 application topically 2 (two) times daily as needed (foot ulcer).  07/31/16   [provider]    Family History Family History  Problem Relation Age of Onset  . Heart disease Father        CVA  . Heart disease Brother   . Heart disease Brother   . Heart disease Brother        MI  . Cancer Brother     Social History Social History  Substance Use Topics  . Smoking status: Former Smoker    Types: Cigarettes  . Smokeless tobacco: Never Used  . Alcohol use No     Allergies   Penicillins   Review of Systems Review of Systems  All other systems reviewed and are negative.    Physical Exam Updated Vital  Signs BP (!) 152/84 (BP Location: Left Arm)   Pulse 100   Resp 20   Ht 1.88 m ( )   Wt 85.2 kg (187 lb 13.3 oz)   SpO2 97%   BMI 24.12 kg/m   Physical Exam  Constitutional: No distress.  HENT:  Head: Normocephalic.  Right Ear: External ear normal.  Left Ear: External ear normal.  Laceration right forehead  Eyes: Conjunctivae are normal. Right eye exhibits no discharge. Left eye exhibits no discharge. No scleral icterus.  Neck: Neck supple. No tracheal deviation present.  Cardiovascular: Normal rate, regular rhythm and intact distal pulses.   Pulmonary/Chest: Effort normal and breath sounds normal. No stridor. No respiratory distress. He has no wheezes. He has no rales.  Abdominal: Soft. Bowel sounds are normal. He  exhibits no distension. There is no tenderness. There is no rebound and no guarding.  Musculoskeletal: He exhibits no edema or tenderness.       Right shoulder: He exhibits no tenderness, no bony tenderness and no swelling.       Left shoulder: He exhibits no tenderness, no bony tenderness and no swelling.       Right wrist: He exhibits no tenderness, no bony tenderness and no swelling.       Left wrist: He exhibits no tenderness, no bony tenderness and no swelling.       Right hip: He exhibits normal range of motion, no tenderness, no bony tenderness and no swelling.       Left hip: He exhibits normal range of motion, no tenderness and no bony tenderness.       Right ankle: He exhibits no swelling. No tenderness.       Left ankle: He exhibits no swelling. No tenderness.       Cervical back: He exhibits no tenderness, no bony tenderness and no swelling.       Thoracic back: He exhibits no tenderness, no bony tenderness and no swelling.       Lumbar back: He exhibits no tenderness, no bony tenderness and no swelling.  Clean dressing on right forearm, no ttp; padded dressing on bilateral lower extremity  Neurological: He is alert. No cranial nerve deficit (no facial  droop, extraocular movements intact, no slurred speech) or sensory deficit. He exhibits normal muscle tone. He displays no seizure activity. Coordination normal. GCS eye subscore is 4. GCS verbal subscore is 4. GCS motor subscore is 6.  Generalized weakness, will move hands and feet minimally, answers questions but is confused about his location and what occurred earlier  Skin: Skin is warm and dry. No rash noted. He is not diaphoretic.  Psychiatric: He has a normal mood and affect.  Nursing note and vitals reviewed.    ED Treatments / Results  Labs (all labs ordered are listed, but only abnormal results are displayed) Labs Reviewed  CBC WITH DIFFERENTIAL/PLATELET - Abnormal; Notable for the following:       Result Value   Hemoglobin 12.5 (*)    HCT 37.3 (*)    Neutro Abs 7.9 (*)    All other components within normal limits  BASIC METABOLIC PANEL - Abnormal; Notable for the following:    Glucose, Bld 151 (*)    BUN 21 (*)    All other components within normal limits  CBG MONITORING, ED - Abnormal; Notable for the following:    Glucose-Capillary 126 (*)    All other components within normal limits    EKG  EKG Interpretation  Date/Time:  Thursday September 21 2016 20:43:15 EDT Ventricular Rate:  53 PR Interval:    QRS Duration: 142 QT Interval:  486 QTC Calculation: 456 R Axis:   -72 Text Interpretation:  Atrial fibrillation with slow ventricular response with a competing junctional pacemaker Left axis deviation Right bundle branch block Inferior infarct , age undetermined Anterolateral infarct , age undetermined Abnormal ECG No significant change since last tracing Confirmed by Linwood Dibbles [phone]) on 09/21/2016 8:55:59 PM       Radiology Ct Head Wo Contrast  Result Date: 09/21/2016 CLINICAL DATA:  Patient status post fall today.  Initial encounter. EXAM: CT HEAD WITHOUT CONTRAST CT CERVICAL SPINE WITHOUT CONTRAST TECHNIQUE: Multidetector CT imaging of the head and cervical  spine was performed following the standard protocol without intravenous contrast. Multiplanar  CT image reconstructions of the cervical spine were also generated. COMPARISON:  Head CT scan 03/22/2010. FINDINGS: CT HEAD FINDINGS Brain: There is some cortical atrophy and chronic microvascular ischemic change. No evidence of acute abnormality including hemorrhage, infarct, mass lesion, mass effect, midline shift or abnormal extra-axial fluid collection. No hydrocephalus or pneumocephalus. Vascular: Atherosclerosis noted. Skull: Intact. Sinuses/Orbits: Status post lens extraction.  Otherwise negative. Other: None. CT CERVICAL SPINE FINDINGS Alignment: Straightening of lordosis is noted.  No listhesis. Skull base and vertebrae: No acute fracture. No primary bone lesion or focal pathologic process. Solid C5-6 ACDF noted. Soft tissues and spinal canal: No prevertebral fluid or swelling. No visible canal hematoma. Disc levels: Loss of disc space height and endplate spurring are worst at C6-7. Scattered facet arthropathy appears worst on the left at C2-3. Upper chest: Lung apices are clear. Other: None. IMPRESSION: No acute abnormality head or cervical spine. Atrophy and chronic microvascular ischemic change. Status post C6-7 ACDF. Degenerative disc disease appears worst at C6-7. Electronically Signed   By: Drusilla Kanner M.D.   On: 09/21/2016 20:11   Ct Cervical Spine Wo Contrast  Result Date: 09/21/2016 CLINICAL DATA:  Patient status post fall today.  Initial encounter. EXAM: CT HEAD WITHOUT CONTRAST CT CERVICAL SPINE WITHOUT CONTRAST TECHNIQUE: Multidetector CT imaging of the head and cervical spine was performed following the standard protocol without intravenous contrast. Multiplanar CT image reconstructions of the cervical spine were also generated. COMPARISON:  Head CT scan 03/22/2010. FINDINGS: CT HEAD FINDINGS Brain: There is some cortical atrophy and chronic microvascular ischemic change. No evidence of acute  abnormality including hemorrhage, infarct, mass lesion, mass effect, midline shift or abnormal extra-axial fluid collection. No hydrocephalus or pneumocephalus. Vascular: Atherosclerosis noted. Skull: Intact. Sinuses/Orbits: Status post lens extraction.  Otherwise negative. Other: None. CT CERVICAL SPINE FINDINGS Alignment: Straightening of lordosis is noted.  No listhesis. Skull base and vertebrae: No acute fracture. No primary bone lesion or focal pathologic process. Solid C5-6 ACDF noted. Soft tissues and spinal canal: No prevertebral fluid or swelling. No visible canal hematoma. Disc levels: Loss of disc space height and endplate spurring are worst at C6-7. Scattered facet arthropathy appears worst on the left at C2-3. Upper chest: Lung apices are clear. Other: None. IMPRESSION: No acute abnormality head or cervical spine. Atrophy and chronic microvascular ischemic change. Status post C6-7 ACDF. Degenerative disc disease appears worst at C6-7. Electronically Signed   By: Drusilla Kanner M.D.   On: 09/21/2016 20:11    Procedures Procedures (including critical care time)  Medications Ordered in ED Medications - No data to display   Initial Impression / Assessment and Plan / ED Course  I have reviewed the triage vital signs and the nursing notes.  Pertinent labs & imaging results that were available during my care of the patient were reviewed by me and considered in my medical decision making (see chart for details).   patient presents to the emergency room for evaluation of a fall. Patient was just discharged from the hospitalist afternoon and admitted to the nursing facility.  Patient most likely attempt to get out of bed on his own and fell. His lab tests and x-rays are reassuring. His laceration was repaired.  patient appears stable to be discharged back to the nursing facility.  Final Clinical Impressions(s) / ED Diagnoses   Final diagnoses:  Laceration of forehead, initial encounter    Fall, initial encounter  Dementia with behavioral disturbance, unspecified dementia type    New Prescriptions New  Prescriptions   No medications on file     Linwood DibblesKnapp, Courtlynn Holloman, MD 09/21/16 2219

## 2016-09-22 LAB — CULTURE, RESPIRATORY

## 2016-09-22 LAB — CULTURE, RESPIRATORY W GRAM STAIN: Culture: NORMAL

## 2016-09-24 LAB — CULTURE, BLOOD (ROUTINE X 2)
Culture: NO GROWTH
Culture: NO GROWTH
SPECIAL REQUESTS: ADEQUATE
Special Requests: ADEQUATE

## 2016-10-03 ENCOUNTER — Inpatient Hospital Stay (HOSPITAL_COMMUNITY): Payer: Medicare Other

## 2016-10-03 ENCOUNTER — Encounter (HOSPITAL_COMMUNITY): Payer: Self-pay

## 2016-10-03 ENCOUNTER — Emergency Department (HOSPITAL_COMMUNITY): Payer: Medicare Other

## 2016-10-03 ENCOUNTER — Inpatient Hospital Stay (HOSPITAL_COMMUNITY)
Admission: EM | Admit: 2016-10-03 | Discharge: 2016-10-19 | DRG: 682 | Disposition: A | Discharge status: Hospice | Payer: Medicare Other | Attending: Nephrology | Admitting: Nephrology

## 2016-10-03 DIAGNOSIS — L97509 Non-pressure chronic ulcer of other part of unspecified foot with unspecified severity: Secondary | ICD-10-CM | POA: Diagnosis present

## 2016-10-03 DIAGNOSIS — E1122 Type 2 diabetes mellitus with diabetic chronic kidney disease: Secondary | ICD-10-CM | POA: Diagnosis present

## 2016-10-03 DIAGNOSIS — J690 Pneumonitis due to inhalation of food and vomit: Secondary | ICD-10-CM | POA: Diagnosis not present

## 2016-10-03 DIAGNOSIS — G9349 Other encephalopathy: Secondary | ICD-10-CM | POA: Diagnosis present

## 2016-10-03 DIAGNOSIS — J189 Pneumonia, unspecified organism: Secondary | ICD-10-CM | POA: Diagnosis not present

## 2016-10-03 DIAGNOSIS — G20 Parkinson's disease: Secondary | ICD-10-CM | POA: Diagnosis present

## 2016-10-03 DIAGNOSIS — I509 Heart failure, unspecified: Secondary | ICD-10-CM | POA: Diagnosis present

## 2016-10-03 DIAGNOSIS — D631 Anemia in chronic kidney disease: Secondary | ICD-10-CM | POA: Diagnosis present

## 2016-10-03 DIAGNOSIS — I10 Essential (primary) hypertension: Secondary | ICD-10-CM | POA: Diagnosis not present

## 2016-10-03 DIAGNOSIS — E1151 Type 2 diabetes mellitus with diabetic peripheral angiopathy without gangrene: Secondary | ICD-10-CM | POA: Diagnosis present

## 2016-10-03 DIAGNOSIS — Z978 Presence of other specified devices: Secondary | ICD-10-CM

## 2016-10-03 DIAGNOSIS — Z4659 Encounter for fitting and adjustment of other gastrointestinal appliance and device: Secondary | ICD-10-CM

## 2016-10-03 DIAGNOSIS — E875 Hyperkalemia: Secondary | ICD-10-CM | POA: Diagnosis present

## 2016-10-03 DIAGNOSIS — N1330 Unspecified hydronephrosis: Secondary | ICD-10-CM | POA: Diagnosis present

## 2016-10-03 DIAGNOSIS — E11621 Type 2 diabetes mellitus with foot ulcer: Secondary | ICD-10-CM | POA: Diagnosis present

## 2016-10-03 DIAGNOSIS — Z79899 Other long term (current) drug therapy: Secondary | ICD-10-CM

## 2016-10-03 DIAGNOSIS — G9340 Encephalopathy, unspecified: Secondary | ICD-10-CM | POA: Diagnosis not present

## 2016-10-03 DIAGNOSIS — N179 Acute kidney failure, unspecified: Secondary | ICD-10-CM | POA: Diagnosis present

## 2016-10-03 DIAGNOSIS — E870 Hyperosmolality and hypernatremia: Secondary | ICD-10-CM | POA: Diagnosis not present

## 2016-10-03 DIAGNOSIS — R059 Cough, unspecified: Secondary | ICD-10-CM

## 2016-10-03 DIAGNOSIS — Z880 Allergy status to penicillin: Secondary | ICD-10-CM | POA: Diagnosis not present

## 2016-10-03 DIAGNOSIS — L89612 Pressure ulcer of right heel, stage 2: Secondary | ICD-10-CM | POA: Diagnosis present

## 2016-10-03 DIAGNOSIS — E785 Hyperlipidemia, unspecified: Secondary | ICD-10-CM | POA: Diagnosis present

## 2016-10-03 DIAGNOSIS — Z7189 Other specified counseling: Secondary | ICD-10-CM | POA: Diagnosis not present

## 2016-10-03 DIAGNOSIS — R627 Adult failure to thrive: Secondary | ICD-10-CM | POA: Diagnosis present

## 2016-10-03 DIAGNOSIS — I4891 Unspecified atrial fibrillation: Secondary | ICD-10-CM | POA: Diagnosis present

## 2016-10-03 DIAGNOSIS — Z9842 Cataract extraction status, left eye: Secondary | ICD-10-CM

## 2016-10-03 DIAGNOSIS — G9341 Metabolic encephalopathy: Secondary | ICD-10-CM

## 2016-10-03 DIAGNOSIS — N19 Unspecified kidney failure: Secondary | ICD-10-CM

## 2016-10-03 DIAGNOSIS — E1142 Type 2 diabetes mellitus with diabetic polyneuropathy: Secondary | ICD-10-CM | POA: Diagnosis present

## 2016-10-03 DIAGNOSIS — Z6822 Body mass index (BMI) 22.0-22.9, adult: Secondary | ICD-10-CM

## 2016-10-03 DIAGNOSIS — E43 Unspecified severe protein-calorie malnutrition: Secondary | ICD-10-CM | POA: Diagnosis present

## 2016-10-03 DIAGNOSIS — Z66 Do not resuscitate: Secondary | ICD-10-CM | POA: Diagnosis present

## 2016-10-03 DIAGNOSIS — N400 Enlarged prostate without lower urinary tract symptoms: Secondary | ICD-10-CM | POA: Diagnosis present

## 2016-10-03 DIAGNOSIS — N139 Obstructive and reflux uropathy, unspecified: Secondary | ICD-10-CM

## 2016-10-03 DIAGNOSIS — I2510 Atherosclerotic heart disease of native coronary artery without angina pectoris: Secondary | ICD-10-CM | POA: Diagnosis present

## 2016-10-03 DIAGNOSIS — R1011 Right upper quadrant pain: Secondary | ICD-10-CM

## 2016-10-03 DIAGNOSIS — Z515 Encounter for palliative care: Secondary | ICD-10-CM | POA: Diagnosis not present

## 2016-10-03 DIAGNOSIS — F0280 Dementia in other diseases classified elsewhere without behavioral disturbance: Secondary | ICD-10-CM | POA: Diagnosis not present

## 2016-10-03 DIAGNOSIS — Z9841 Cataract extraction status, right eye: Secondary | ICD-10-CM

## 2016-10-03 DIAGNOSIS — Z87891 Personal history of nicotine dependence: Secondary | ICD-10-CM

## 2016-10-03 DIAGNOSIS — A419 Sepsis, unspecified organism: Secondary | ICD-10-CM | POA: Diagnosis not present

## 2016-10-03 DIAGNOSIS — G4733 Obstructive sleep apnea (adult) (pediatric): Secondary | ICD-10-CM | POA: Diagnosis present

## 2016-10-03 DIAGNOSIS — I5031 Acute diastolic (congestive) heart failure: Secondary | ICD-10-CM | POA: Diagnosis not present

## 2016-10-03 DIAGNOSIS — R05 Cough: Secondary | ICD-10-CM

## 2016-10-03 DIAGNOSIS — I110 Hypertensive heart disease with heart failure: Secondary | ICD-10-CM | POA: Diagnosis present

## 2016-10-03 HISTORY — DX: Benign prostatic hyperplasia without lower urinary tract symptoms: N40.0

## 2016-10-03 LAB — COMPREHENSIVE METABOLIC PANEL
ALT: 6 U/L — ABNORMAL LOW (ref 17–63)
ANION GAP: 15 (ref 5–15)
AST: 41 U/L (ref 15–41)
Albumin: 2.4 g/dL — ABNORMAL LOW (ref 3.5–5.0)
Alkaline Phosphatase: 209 U/L — ABNORMAL HIGH (ref 38–126)
BUN: 208 mg/dL — ABNORMAL HIGH (ref 6–20)
CO2: 19 mmol/L — ABNORMAL LOW (ref 22–32)
Calcium: 8.2 mg/dL — ABNORMAL LOW (ref 8.9–10.3)
Chloride: 108 mmol/L (ref 101–111)
Creatinine, Ser: 8.04 mg/dL — ABNORMAL HIGH (ref 0.61–1.24)
GFR calc Af Amer: 6 mL/min — ABNORMAL LOW (ref >60)
GFR, EST NON AFRICAN AMERICAN: 5 mL/min — AB (ref >60)
GLUCOSE: 143 mg/dL — AB (ref 65–99)
POTASSIUM: 5.5 mmol/L — AB (ref 3.5–5.1)
SODIUM: 142 mmol/L (ref 135–145)
TOTAL PROTEIN: 7.6 g/dL (ref 6.5–8.1)
Total Bilirubin: 0.9 mg/dL (ref 0.3–1.2)

## 2016-10-03 LAB — URINALYSIS, ROUTINE W REFLEX MICROSCOPIC
BILIRUBIN URINE: NEGATIVE
GLUCOSE, UA: NEGATIVE mg/dL
Ketones, ur: NEGATIVE mg/dL
LEUKOCYTES UA: NEGATIVE
NITRITE: NEGATIVE
Protein, ur: NEGATIVE mg/dL
Specific Gravity, Urine: 1.014 (ref 1.005–1.030)
Squamous Epithelial / HPF: NONE SEEN
pH: 5 (ref 5.0–8.0)

## 2016-10-03 LAB — CBC WITH DIFFERENTIAL/PLATELET
BASOS ABS: 0 10*3/uL (ref 0.0–0.1)
Basophils Relative: 0 %
Eosinophils Absolute: 0 10*3/uL (ref 0.0–0.7)
Eosinophils Relative: 0 %
HEMATOCRIT: 40 % (ref 39.0–52.0)
HEMOGLOBIN: 12.8 g/dL — AB (ref 13.0–17.0)
LYMPHS PCT: 3 %
Lymphs Abs: 0.7 10*3/uL (ref 0.7–4.0)
MCH: 27.4 pg (ref 26.0–34.0)
MCHC: 32 g/dL (ref 30.0–36.0)
MCV: 85.5 fL (ref 78.0–100.0)
MONOS PCT: 7 %
Monocytes Absolute: 1.6 10*3/uL — ABNORMAL HIGH (ref 0.1–1.0)
NEUTROS ABS: 20.1 10*3/uL — AB (ref 1.7–7.7)
Neutrophils Relative %: 90 %
Platelets: 310 10*3/uL (ref 150–400)
RBC: 4.68 MIL/uL (ref 4.22–5.81)
RDW: 15.3 % (ref 11.5–15.5)
WBC: 22.4 10*3/uL — AB (ref 4.0–10.5)

## 2016-10-03 LAB — LACTIC ACID, PLASMA: Lactic Acid, Venous: 1.5 mmol/L (ref 0.5–1.9)

## 2016-10-03 LAB — AMMONIA: Ammonia: 27 umol/L (ref 9–35)

## 2016-10-03 LAB — CK: CK TOTAL: 49 U/L (ref 49–397)

## 2016-10-03 LAB — I-STAT CG4 LACTIC ACID, ED: LACTIC ACID, VENOUS: 2.47 mmol/L — AB (ref 0.5–1.9)

## 2016-10-03 LAB — VALPROIC ACID LEVEL: Valproic Acid Lvl: <10 ug/mL — ABNORMAL LOW (ref 50.0–100.0)

## 2016-10-03 MED ORDER — SODIUM CHLORIDE 0.9 % IV BOLUS (SEPSIS)
500.0000 mL | Freq: Once | INTRAVENOUS | Status: AC
Start: 2016-10-03 — End: 2016-10-03
  Administered 2016-10-03: 500 mL via INTRAVENOUS

## 2016-10-03 MED ORDER — SODIUM CHLORIDE 0.9 % IV SOLN
1500.0000 mg | Freq: Once | INTRAVENOUS | Status: AC
  Administered 2016-10-03: 1500 mg via INTRAVENOUS
  Filled 2016-10-03: qty 1500

## 2016-10-03 MED ORDER — SODIUM CHLORIDE 0.9 % IV SOLN
INTRAVENOUS | Status: AC
  Administered 2016-10-03: 22:00:00 via INTRAVENOUS

## 2016-10-03 MED ORDER — DEXTROSE 5 % IV SOLN
2.0000 g | Freq: Once | INTRAVENOUS | Status: AC
  Administered 2016-10-03: 2 g via INTRAVENOUS
  Filled 2016-10-03: qty 2

## 2016-10-03 MED ORDER — VANCOMYCIN HCL IN DEXTROSE 1-5 GM/200ML-% IV SOLN
1000.0000 mg | Freq: Once | INTRAVENOUS | Status: DC
  Filled 2016-10-03: qty 200

## 2016-10-03 MED ORDER — SODIUM CHLORIDE 0.9 % IV BOLUS (SEPSIS)
500.0000 mL | Freq: Once | INTRAVENOUS | Status: AC
  Administered 2016-10-03: 500 mL via INTRAVENOUS

## 2016-10-03 MED ORDER — LEVOFLOXACIN IN D5W 500 MG/100ML IV SOLN: 500.0000 mg | INTRAVENOUS | Status: DC

## 2016-10-03 MED ORDER — LEVOFLOXACIN IN D5W 750 MG/150ML IV SOLN
750.0000 mg | Freq: Once | INTRAVENOUS | Status: AC
  Administered 2016-10-03: 750 mg via INTRAVENOUS
  Filled 2016-10-03: qty 150

## 2016-10-03 MED ORDER — DEXTROSE 5 % IV SOLN
500.0000 mg | Freq: Three times a day (TID) | INTRAVENOUS | Status: DC
  Administered 2016-10-04: 500 mg via INTRAVENOUS
  Filled 2016-10-03 (×2): qty 0.5

## 2016-10-03 NOTE — ED Notes (Signed)
Dr. Danford at bedside  

## 2016-10-03 NOTE — H&P (Addendum)
History and Physical  Patient Name: Ryan Cunningham     ION:[id number]    DOB: 06-28-1927    DOA: 10/03/2016 PCP: Jarome Matin, MD  Patient coming from: [facility]  Chief Complaint: Confusion, lethargy      HPI: Ryan Cunningham is a 83 y.o. male with a past medical history significant for Parkinson's disease, HTN, and foot ulcers who presents with lethargy, altered mental status.  IVF at all history is collected from the daughter at the bedside, as the patient is altered and cannot provide his own history.  Evidently at baseline, the patient lives at home with his wife, still drives, still mows the grass, has no help in the home, and has only maybe the slightest memory issues.  About two weeks ago, he became acutely confused, was admitted to the hospital and diagnozed with pneumonia, treated with Levaquin to Ceftin and discharged to SNF 10 days ago.  He did have a fall and lacerate his head the first night, but went back to SNF.  Since arriving at SNF, he has been intermittently confused according to daughter.  He has "good days and bad" where he is either more or less alert, but has been getting progressively weaker and having progressively less PO intake, until today, he was lethargic, appeared ill to staff, and family asked that he be brought to the ER.  No fever.  No worsening of his cough.  No reported urinary symptoms.  No new medicines at the SNF (he had completed the furosemide and Ceftin he was discharged with).  No NSAIDs on SNF MAR.  ED course: -Afebrile, heart rate 53, respirations 22, blood pressure 130/62, pulse ox 98% on room air -Na 142, K 5.5, Cr 8.04 (baseline 1.0 at discharge 2 weeks ago), WBC 22.4K, Hgb 12.8 -CXR showed improving aeration in left base -CK normal -Lactate 2.47 -Blood and urine cultures were obtained -Urinalysis showed rbc's TNTC, hyalin casts, no WBCs -ECG unchanged -In and out cath returned 500 mL of cloudy brown urine, and afterwards Foley  placement yielded another 1 L of urine, and the patient's agitation improved -He was given vanc/Azactam and Levaquin and TRH were asked to evaluate    ROS: Review of Systems  Unable to perform ROS: Mental status change          Past Medical History:  Diagnosis Date  . Acute bronchitis   . Acute bronchitis   . Acute maxillary sinusitis   . Atrial fibrillation (HCC)   . Cardiomegaly   . Cellulitis and abscess of foot, except toes   . Cervicalgia   . Chronic kidney disease, unspecified   . Congestive heart failure, unspecified   . Coronary atherosclerosis of unspecified type of vessel, native or graft   . Edema   . Encounter for long-term (current) use of other medications   . Essential and other specified forms of tremor   . Impotence of organic origin   . Obstructive sleep apnea (adult) (pediatric)   . Other and unspecified hyperlipidemia   . Other atopic dermatitis and related conditions   . Other specified idiopathic peripheral neuropathy   . Paralysis agitans (HCC)   . Peripheral vascular disease, unspecified (HCC)   . Pressure ulcer, other site(707.09)   . Right bundle branch block and left anterior fascicular block   . Seborrhea   . Syncope and collapse   . Type II or unspecified type diabetes mellitus without mention of complication, not stated as uncontrolled   . Ulcer  of lower limb, unspecified   . Ulcer of other part of foot   . Unspecified essential hypertension   . Urinary frequency     Past Surgical History:  Procedure Laterality Date  . BACK SURGERY  1991   Dr.Ames   . CATARACT EXTRACTION, BILATERAL  2008   Dr.Hecker   . CERVICAL DISCECTOMY  2004   Dr.Kritzer  . CERVICAL SPINE SURGERY  1988   Dr.Nudelman  . COLONOSCOPY  06/08/2009   Dr.John Madilyn Fireman   . KNEE SURGERY  1974  . LUMBAR SPINE SURGERY  1970  . LUMBAR SPINE SURGERY  1988  . SHOULDER SURGERY  1988  . TONSILLECTOMY  1981    Social History: Patient lives with his wife at baseline, has  been at SNF since last 2 weeks.  The patient walks unassisted at baseline.  Nonsmoker.  Allergies  Allergen Reactions  . Penicillins Rash    Has patient had a PCN reaction causing immediate rash, facial/tongue/throat swelling, SOB or lightheadedness with hypotension: Yes Has patient had a PCN reaction causing severe rash involving mucus membranes or skin necrosis: No Has patient had a PCN reaction that required hospitalization: No Has patient had a PCN reaction occurring within the last 10 years: No If all of the above answers are "NO", then may proceed with Cephalosporin use.     Family history: family history includes Cancer in his brother; Heart disease in his brother, brother, brother, and father.  Prior to Admission medications   Medication Sig Start Date End Date Taking? Authorizing Provider  carbidopa-levodopa (SINEMET IR) 10-100 MG per tablet Take 2 tablets by mouth 3 (three) times daily. To help tremor. 08/05/14  Yes Kirt Boys, DO  divalproex (DEPAKOTE SPRINKLE) 125 MG capsule Take 125 mg by mouth 2 (two) times daily.   Yes [provider]  ipratropium (ATROVENT) 0.03 % nasal spray 1 spray as directed. 1 spray into mouth up to three times daily as directed to lessen drooling. 08/10/16  Yes [provider]  losartan (COZAAR) 100 MG tablet Take 100 mg by mouth daily.  07/18/16  Yes [provider]  Misc. Devices (COVERALL BOOTS/DISPOSABLE/UNIV) MISC by Does not apply route. Prevalon Boots---Ensure Boots are on bilateral feet at all times while in the bed   Yes [provider]  Multiple Vitamins-Minerals (ICAPS AREDS FORMULA PO) Take 1 tablet by mouth daily.   Yes [provider]  simvastatin (ZOCOR) 80 MG tablet Take one half tablet by mouth daily to lower cholesterol. 08/05/14  Yes Carter, Monica, DO  SSD 1 % cream Apply 1 application topically 2 (two) times daily as needed (foot ulcer).  07/31/16   [provider]        Physical Exam: BP (!) 131/47   Pulse (!) 59   Temp 98 F (36.7 C) (Rectal)   Resp 17   Ht  (1.88 m)   Wt 84.8 kg (187 lb)   SpO2 100%   BMI 24.01 kg/m  General appearance: Well-developed, thin elderly adult male, awake but agitated and not alert to my presence, appears uncomfortable.   Eyes: Anicteric, conjunctiva pink, lids and lashes normal. Does not open eyes to exam.    ENT: No nasal deformity, discharge, epistaxis.  Hearing seems HOH. Lips dry, does not open mouth.   Neck: No neck masses.  Trachea midline.  No thyromegaly/tenderness. Lymph: No cervical or supraclavicular lymphadenopathy. Skin: Warm and dry.  No jaundice.  No suspicious rashes or lesions.  Old foot  ulcers stable, no significant redness around them.  At baseline per family. Cardiac: Slow regular, nl S1-S2, no murmurs appreciated.  Capillary refill is brisk.  No JVD.  No LE edema.  Radial pulses 2+ and symmetric. Respiratory: Normal respiratory rate and rhythm.  CTAB without rales or wheezes. Abdomen: Abdomen soft.  Voluntary gaurding. No ascites, distension, hepatosplenomegaly.   MSK: No deformities or effusions.  No cyanosis or clubbing. Neuro: Cranial nerves unable to assess.  Sensation unable to assess.  Muscle strength in arms seems strong.  No alert to my presence.  Groans, agitated. Psych: Unable to assess.    Labs on Admission:  I have personally reviewed following labs and imaging studies: CBC:  Recent Labs Lab 10/03/16 1908  WBC 22.4*  NEUTROABS 20.1*  HGB 12.8*  HCT 40.0  MCV 85.5  PLT 310   Basic Metabolic Panel:  Recent Labs Lab 10/03/16 1908  NA 142  K 5.5*  CL 108  CO2 19*  GLUCOSE 143*  BUN 208*  CREATININE 8.04*  CALCIUM 8.2*   GFR: Estimated Creatinine Clearance: 7.5 mL/min (A) (by C-G formula based on SCr of 8.04 mg/dL (H)).  Liver Function Tests:  Recent Labs Lab 10/03/16 1908  AST 41  ALT 6*  ALKPHOS 209*  BILITOT 0.9  PROT 7.6  ALBUMIN 2.4*    No results for input(s): LIPASE, AMYLASE in the last 168 hours. No results for input(s): AMMONIA in the last 168 hours. Coagulation Profile: No results for input(s): INR, PROTIME in the last 168 hours. Cardiac Enzymes:  Recent Labs Lab 10/03/16 1908  CKTOTAL 49   BNP (last 3 results) No results for input(s): PROBNP in the last 8760 hours. HbA1C: No results for input(s): HGBA1C in the last 72 hours. CBG: No results for input(s): GLUCAP in the last 168 hours. Lipid Profile: No results for input(s): CHOL, HDL, LDLCALC, TRIG, CHOLHDL, LDLDIRECT in the last 72 hours. Thyroid Function Tests: No results for input(s): TSH, T4TOTAL, FREET4, T3FREE, THYROIDAB in the last 72 hours. Anemia Panel: No results for input(s): VITAMINB12, FOLATE, FERRITIN, TIBC, IRON, RETICCTPCT in the last 72 hours. Sepsis Labs: Lactic acid 2.47 Invalid input(s): PROCALCITONIN, LACTICIDVEN No results found for this or any previous visit (from the past 240 hour(s)).       Radiological Exams on Admission: Personally reviewed CXR shows L opacity, similar to before: Dg Chest Portable 1 View  Result Date: 10/03/2016 CLINICAL DATA:  AMS EXAM: PORTABLE CHEST 1 VIEW COMPARISON:  09/18/2016 FINDINGS: The heart is enlarged. There is pulmonary vascular congestion. No overt edema. There has been some improvement in aeration of the left lung base. Persistent atelectasis or resolving infiltrate remains. IMPRESSION: 1. Cardiomegaly and vascular congestion. 2. Improving aeration at the left lung base with persistent atelectasis or infiltrate. Electronically Signed   By: Norva Pavlov M.D.   On: 10/03/2016 20:34    EKG: Independently reviewed. Rate 52, RBBB, LAFB, no change from previous.  Echocardiogram September 2018: Report reviewed Mild LVH EF 50-55% Mild to moderate MR Moderate PR PHP 40          Assessment/Plan  1. Acute renal failure:  Off diuretics. ARB contributing, no NSAIDs. However suspect  this is actually from obstruction or infection. No previous history of obstruction.   -Maintain Foley -Check urine lites -Continue IV fluids and trend creatinine -Follow intake and output -Hold ARB -Obtain renal ultrasound   2. Possible sepsis, will rule out:  Suspected source unclear.  Patient meets criteria given tachypnea, leukocytosis,  and evidence of organ dysfunction, although there are alternative explanations for his elevated lactic acid and leukocytosis.  Lactate 2.47 mmol/L and repeat ordered within 6 hours.    -Sepsis bundle utilized:  -Blood and urine cultures drawn  -Fluid bolus given in ED, will repeat lactic acid  -Antibiotics: vancomycin/Aztreonam/Levaquin  -Repeat renal function and complete blood count in AM  -Code SEPSIS called to E-link  -Trend procalcitonin  -Low threshold to de-escalate antibiotics   3. Parkinson's:  -Continue Sinemet -Hold depakote  4. Acute encephalopathy:  No dementia at baseline, suspect some MCI.  Encephalopathy likely from uremia, AKI.   -Check depakote and ammonia levels -Check CT head (recent fall/head injury)  5. Hypertension:  -Hold ARB for now  6. Foot wounds:  This appears baseline -Consult WOC  7. Hyperkalemia:  Problem #1 above. ECG stable. -IV fluids -Trend K     DVT prophylaxis: Lovenox, low dose  Code Status: DO NOT RESUSCITATE  Family Communication: Daughter and son in law at [place]  Disposition Plan: Anticipate IV fluids, trend Cr.  Empiric antibiotics for now, follow culture data.  [place] 4-5 days admission. Consults called: None  Admission status: INPATIENT   Medical decision making: Patient seen at 10:10 PM on 10/03/2016.  The patient was discussed with Dr. Criss Alvine.  What exists of the patient's chart was reviewed in depth and summarized above.  Clinical condition: stable hemodynamically.        Alberteen Sam [facility] Pager [phone]         At the time of admission,  it appears that the appropriate admission status for this patient is INPATIENT. This is judged to be reasonable and necessary in order to provide the required intensity of service to ensure the patient's safety given the presenting symptoms, physical exam findings, and initial radiographic and laboratory data in the context of their chronic comorbidities.  Together, these circumstances are felt to place him at high risk for further clinical deterioration threatening life, limb, or organ.   Patient requires inpatient status due to high intensity of service, high risk for further deterioration and high frequency of surveillance required because of this acute illness that poses a threat to life, limb or bodily function.  I certify that at the point of admission it is my clinical judgment that the patient will require inpatient hospital care spanning beyond 2 midnights from the point of admission and that early discharge would result in unnecessary risk of decompensation and readmission or threat to life, limb or bodily function.

## 2016-10-03 NOTE — Progress Notes (Signed)
Pharmacy Antibiotic Note  Ryan Cunningham is a 83 y.o. male admitted on 10/03/2016 with sepsis.  Pharmacy has been consulted for vancomycin, aztreonam, and levaquin dosing. He is in acute renal failure with sCr 8.04 and CrCl 7.35ml/min. Baseline sCr 1-1.3.   Vancomycin trough goal 15-20  Plan: 1) Vancomycin  IV x 1, f/u renal function for further maintenance dosing 2) Aztreonam 2g IV x 1 then  IV q8 3) Levaquin  IV x 1 then  IV q48   Height:  (188 cm) Weight: 187 lb (84.8 kg) IBW/kg (Calculated) : 82.2  Temp (24hrs), Avg:97.8 F (36.6 C), Min:97.8 F (36.6 C), Max:97.8 F (36.6 C)   Recent Labs Lab 10/03/16 1908 10/03/16 1914  WBC 22.4*  --   CREATININE 8.04*  --   LATICACIDVEN  --  2.47*    Estimated Creatinine Clearance: 7.5 mL/min (A) (by C-G formula based on SCr of 8.04 mg/dL (H)).    Allergies  Allergen Reactions  . Penicillins Rash    Has patient had a PCN reaction causing immediate rash, facial/tongue/throat swelling, SOB or lightheadedness with hypotension: Yes Has patient had a PCN reaction causing severe rash involving mucus membranes or skin necrosis: No Has patient had a PCN reaction that required hospitalization: No Has patient had a PCN reaction occurring within the last 10 years: No If all of the above answers are "NO", then may proceed with Cephalosporin use.     Antimicrobials this admission: 9/25 Vancomycin >> 9/25 Aztreonam >> 9/25 Levaquin >>  Dose adjustments this admission: n/a  Microbiology results: 9/25 blood cx >> 9/25 urine cx >>  Thank you for allowing pharmacy to be a part of this patient's care.  Fredrik Rigger 10/03/2016 8:47 PM

## 2016-10-03 NOTE — ED Notes (Signed)
Portable and Phlebotomy in room.

## 2016-10-03 NOTE — ED Provider Notes (Signed)
[facility]-EMERGENCY DEPT Provider Note   CSN: [id number] Arrival date & time: 10/03/16  1845   LEVEL 5 CAVEAT - ALTERED MENTAL STATUS  History   Chief Complaint Chief Complaint  Patient presents with  . Altered Mental Status  . Weakness    HPI Ryan Cunningham is a 83 y.o. male.  HPI  83 year old male presents with acute altered mental status. History is taken from the facility.He was recently admitted and discharged 2 weeks ago He then fell the next day when he went to the facility. He has been doing okay with on and off brief confusion and likely dementia. However today since around noon he is lethargic and not acting like himself. Typically he talks and responds to questions despite his poor hearing. However now he is much less responsive. No fevers. Did not eat lunch or dinner. He did not take his evening medicines.  Past Medical History:  Diagnosis Date  . Acute bronchitis   . Acute bronchitis   . Acute maxillary sinusitis   . Atrial fibrillation (HCC)   . Cardiomegaly   . Cellulitis and abscess of foot, except toes   . Cervicalgia   . Chronic kidney disease, unspecified   . Congestive heart failure, unspecified   . Coronary atherosclerosis of unspecified type of vessel, native or graft   . Edema   . Encounter for long-term (current) use of other medications   . Essential and other specified forms of tremor   . Impotence of organic origin   . Obstructive sleep apnea (adult) (pediatric)   . Other and unspecified hyperlipidemia   . Other atopic dermatitis and related conditions   . Other specified idiopathic peripheral neuropathy   . Paralysis agitans (HCC)   . Peripheral vascular disease, unspecified (HCC)   . Pressure ulcer, other site(707.09)   . Right bundle branch block and left anterior fascicular block   . Seborrhea   . Syncope and collapse   . Type II or unspecified type diabetes mellitus without mention of complication, not stated as uncontrolled   . Ulcer of  lower limb, unspecified   . Ulcer of other part of foot   . Unspecified essential hypertension   . Urinary frequency     Patient Active Problem List   Diagnosis Date Noted  . Acute renal failure (ARF) (HCC) 10/03/2016  . Hyperkalemia 10/03/2016  . Sepsis (HCC) 10/03/2016  . Acute encephalopathy 09/18/2016  . Anemia 09/18/2016  . Hypervolemia 09/18/2016  . Elevated troponin 09/18/2016  . Foot ulcer (HCC) 09/18/2016  . CAP (community acquired pneumonia) 09/18/2016  . Diabetic ulcer of left midfoot associated with type 2 diabetes mellitus, limited to breakdown of skin (HCC) 08/01/2016  . Idiopathic chronic venous hypertension of both lower extremities with inflammation 08/01/2016  . Hyperlipidemia LDL goal <100 08/05/2014  . Chronic atrial fibrillation (HCC) 08/05/2014  . Essential hypertension 08/05/2014  . Diabetes mellitus with renal manifestations, controlled (HCC) 08/05/2014  . Diabetes mellitus with renal complications (HCC) 07/23/2013  . Diabetic polyneuropathy associated with type 2 diabetes mellitus (HCC) 07/23/2013  . CKD (chronic kidney disease), symptom management only 07/23/2013  . Impacted cerumen 07/23/2013  . 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 A-fib (HCC) 01/22/2013  . RBBB 01/22/2013  . OSA (obstructive sleep apnea) 01/22/2013  . Prediabetes 07/30/2012  . Bradycardia 04/03/2012  . Parkinson disease (HCC) 04/03/2012  . HTN (hypertension) 04/03/2012  . Other and unspecified hyperlipidemia 04/03/2012  . CKD (chronic kidney disease) 04/03/2012  Past Surgical History:  Procedure Laterality Date  . BACK SURGERY  1991   Dr.Ames   . CATARACT EXTRACTION, BILATERAL  2008   Dr.Hecker   . CERVICAL DISCECTOMY  2004   Dr.Kritzer  . CERVICAL SPINE SURGERY  1988   Dr.Nudelman  . COLONOSCOPY  06/08/2009   Dr.John Madilyn Fireman   . KNEE SURGERY  1974  . LUMBAR SPINE SURGERY  1970  . LUMBAR SPINE SURGERY  1988  . SHOULDER  SURGERY  1988  . TONSILLECTOMY  1981       Home Medications    Prior to Admission medications   Medication Sig Start Date End Date Taking? Authorizing Provider  carbidopa-levodopa (SINEMET IR) 10-100 MG per tablet Take 2 tablets by mouth 3 (three) times daily. To help tremor. 08/05/14  Yes Kirt Boys, DO  divalproex (DEPAKOTE SPRINKLE) 125 MG capsule Take 125 mg by mouth 2 (two) times daily.   Yes [provider]  ipratropium (ATROVENT) 0.03 % nasal spray 1 spray as directed. 1 spray into mouth up to three times daily as directed to lessen drooling. 08/10/16  Yes [provider]  losartan (COZAAR) 100 MG tablet Take 100 mg by mouth daily.  07/18/16  Yes [provider]  Misc. Devices (COVERALL BOOTS/DISPOSABLE/UNIV) MISC by Does not apply route. Prevalon Boots---Ensure Boots are on bilateral feet at all times while in the bed   Yes [provider]  Multiple Vitamins-Minerals (ICAPS AREDS FORMULA PO) Take 1 tablet by mouth daily.   Yes [provider]  simvastatin (ZOCOR) 80 MG tablet Take one half tablet by mouth daily to lower cholesterol. 08/05/14  Yes Carter, Monica, DO  SSD 1 % cream Apply 1 application topically 2 (two) times daily as needed (foot ulcer).  07/31/16   [provider]    Family History Family History  Problem Relation Age of Onset  . Heart disease Father        CVA  . Heart disease Brother   . Heart disease Brother   . Heart disease Brother        MI  . Cancer Brother     Social History Social History  Substance Use Topics  . Smoking status: Former Smoker    Types: Cigarettes  . Smokeless tobacco: Never Used  . Alcohol use No     Allergies   Penicillins   Review of Systems Review of Systems  Unable to perform ROS: Mental status change     Physical Exam Updated Vital Signs BP (!) 128/58   Pulse (!) 59   Temp 98 F (36.7 C) (Rectal)   Resp 18   Ht  (1.88 m)   Wt 84.8 kg (187 lb)    SpO2 100%   BMI 24.01 kg/m   Physical Exam  Constitutional: He appears well-developed and well-nourished. He appears lethargic.  HENT:  Head: Normocephalic and atraumatic.  Right Ear: External ear normal.  Left Ear: External ear normal.  Nose: Nose normal.  Eyes: Right eye exhibits no discharge. Left eye exhibits no discharge.  Neck: Neck supple.  Cardiovascular: Normal rate, regular rhythm and normal heart sounds.   Pulmonary/Chest: Effort normal and breath sounds normal.  Abdominal: Soft. There is no tenderness.  Musculoskeletal: He exhibits no edema.  Neurological: He appears lethargic.  He is awake but lethargic. Responds to pain but does not answer questions. Moves all 4 extremities.   Skin: Skin is warm and dry.  Nursing note and vitals reviewed.  ED Treatments / Results  Labs (all labs ordered are listed, but only abnormal results are displayed) Labs Reviewed  COMPREHENSIVE METABOLIC PANEL - Abnormal; Notable for the following:       Result Value   Potassium 5.5 (*)    CO2 19 (*)    Glucose, Bld 143 (*)    BUN 208 (*)    Creatinine, Ser 8.04 (*)    Calcium 8.2 (*)    Albumin 2.4 (*)    ALT 6 (*)    Alkaline Phosphatase 209 (*)    GFR calc non Af Amer 5 (*)    GFR calc Af Amer 6 (*)    All other components within normal limits  CBC WITH DIFFERENTIAL/PLATELET - Abnormal; Notable for the following:    WBC 22.4 (*)    Hemoglobin 12.8 (*)    Neutro Abs 20.1 (*)    Monocytes Absolute 1.6 (*)    All other components within normal limits  URINALYSIS, ROUTINE W REFLEX MICROSCOPIC - Abnormal; Notable for the following:    Hgb urine dipstick MODERATE (*)    Bacteria, UA RARE (*)    All other components within normal limits  VALPROIC ACID LEVEL - Abnormal; Notable for the following:    Valproic Acid Lvl <10 (*)    All other components within normal limits  I-STAT CG4 LACTIC ACID, ED - Abnormal; Notable for the following:    Lactic Acid, Venous 2.47 (*)    All  other components within normal limits  CULTURE, BLOOD (ROUTINE X 2)  CULTURE, BLOOD (ROUTINE X 2)  URINE CULTURE  CK  LACTIC ACID, PLASMA  AMMONIA  LACTIC ACID, PLASMA    EKG  EKG Interpretation  Date/Time:  Tuesday October 03 2016 18:58:37 EDT Ventricular Rate:  52 PR Interval:    QRS Duration: 178 QT Interval:  509 QTC Calculation: 474 R Axis:   -74 Text Interpretation:  Sinus rhythm Short PR interval RBBB and LAFB Probable LVH with secondary repol abnrm Artifact in lead(s) I III aVR aVL V1 V2 no signficant change compared to Sept 13 2018 Confirmed by Pricilla Loveless [phone]) on 10/03/2016 7:54:08 PM       Radiology Ct Head Wo Contrast  Result Date: 10/03/2016 CLINICAL DATA:  Altered level of consciousness.  Syncope. EXAM: CT HEAD WITHOUT CONTRAST TECHNIQUE: Contiguous axial images were obtained from the base of the skull through the vertex without intravenous contrast. COMPARISON:  Head CT 09/21/2016 FINDINGS: Brain: Stable degree of atrophy and chronic small vessel ischemia from prior. No intracranial hemorrhage, mass effect, or midline shift. No hydrocephalus. The basilar cisterns are patent. No evidence of territorial infarct or acute ischemia. No extra-axial or intracranial fluid collection. Vascular: Atherosclerosis of skullbase vasculature without hyperdense vessel or abnormal calcification. Skull: No fracture or focal lesion. Sinuses/Orbits: Paranasal sinuses and mastoid air cells are clear. The visualized orbits are unremarkable. Bilateral cataract section. Other: None. IMPRESSION: No acute intracranial abnormality. Electronically Signed   By: Rubye Oaks M.D.   On: 10/03/2016 22:51   Dg Chest Portable 1 View  Result Date: 10/03/2016 CLINICAL DATA:  AMS EXAM: PORTABLE CHEST 1 VIEW COMPARISON:  09/18/2016 FINDINGS: The heart is enlarged. There is pulmonary vascular congestion. No overt edema. There has been some improvement in aeration of the left lung base. Persistent  atelectasis or resolving infiltrate remains. IMPRESSION: 1. Cardiomegaly and vascular congestion. 2. Improving aeration at the left lung base with persistent atelectasis or infiltrate. Electronically Signed   By: Norva Pavlov  M.D.   On: 10/03/2016 20:34    Procedures Procedures (including critical care time)  CRITICAL CARE Performed by: Pricilla Loveless T   Total critical care time: 30 minutes  Critical care time was exclusive of separately billable procedures and treating other patients.  Critical care was necessary to treat or prevent imminent or life-threatening deterioration.  Critical care was time spent personally by me on the following activities: development of treatment plan with patient and/or surrogate as well as nursing, discussions with consultants, evaluation of patient's response to treatment, examination of patient, obtaining history from patient or surrogate, ordering and performing treatments and interventions, ordering and review of laboratory studies, ordering and review of radiographic studies, pulse oximetry and re-evaluation of patient's condition.   Medications Ordered in ED Medications  levofloxacin (LEVAQUIN) IVPB 500 mg (not administered)  aztreonam (AZACTAM) 500 mg in dextrose 5 % 50 mL IVPB (not administered)  0.9 %  sodium chloride infusion ( Intravenous New Bag/Given 10/03/16 2152)  sodium chloride 0.9 % bolus 500 mL (0 mLs Intravenous Stopped 10/03/16 2058)  levofloxacin (LEVAQUIN) IVPB 750 mg (0 mg Intravenous Stopped 10/03/16 2154)  aztreonam (AZACTAM) 2 g in dextrose 5 % 50 mL IVPB (0 g Intravenous Stopped 10/03/16 2054)  sodium chloride 0.9 % bolus 500 mL (0 mLs Intravenous Stopped 10/03/16 2159)  vancomycin (VANCOCIN) 1,500 mg in sodium chloride 0.9 % 500 mL IVPB (0 mg Intravenous Stopped 10/03/16 2320)     Initial Impression / Assessment and Plan / ED Course  I have reviewed the triage vital signs and the nursing notes.  Pertinent labs & imaging  results that were available during my care of the patient were reviewed by me and considered in my medical decision making (see chart for details).  Clinical Course as of Oct 04 99  Tue Oct 03, 2016  1952 No clear infectious source but with acute AMS change, recent hospitalization, mildly elevated lactate and significantly elevated WBC, will treat as code sepsis and initiate broad antibiotics. Will get rectal temp and I/O UA.  [SG]    Clinical Course User Index [SG] Pricilla Loveless, MD    Patient's AMS appears to be from severe uremia. Appears to be from obstructive uropathy from urinary retention. Likely from prostate hypertrophy. No significant electrolyte abnormality such as hyperkalemia. No clear infectious source, thus antibiotics can likely be cancelled as cause is now more clear. Dr. Maryfrances Bunnell to admit.   Final Clinical Impressions(s) / ED Diagnoses   Final diagnoses:  Uremic encephalopathy  Acute renal failure, unspecified acute renal failure type (HCC)  Obstructive uropathy    New Prescriptions New Prescriptions   No medications on file     Pricilla Loveless, MD 10/04/16 [phone]

## 2016-10-03 NOTE — ED Triage Notes (Signed)
Pt from [facility] due to lethargy that began at 1600. Faclity reports that pt would not take his daily meds at same time and appeared to be more lethargic than normal. Facility notified family and family wanted him brought here. All VSS. Pt has severe dementia. Pt moving all extremities equally, no facial droop.

## 2016-10-03 NOTE — ED Notes (Signed)
Lab called by this Rn to add on CK

## 2016-10-03 NOTE — ED Notes (Signed)
Pt appears more calm now since catheter placed.

## 2016-10-04 ENCOUNTER — Inpatient Hospital Stay (HOSPITAL_COMMUNITY): Payer: Medicare Other

## 2016-10-04 ENCOUNTER — Encounter (HOSPITAL_COMMUNITY): Payer: Self-pay | Admitting: Family Medicine

## 2016-10-04 DIAGNOSIS — N139 Obstructive and reflux uropathy, unspecified: Secondary | ICD-10-CM

## 2016-10-04 DIAGNOSIS — N179 Acute kidney failure, unspecified: Principal | ICD-10-CM

## 2016-10-04 DIAGNOSIS — I10 Essential (primary) hypertension: Secondary | ICD-10-CM

## 2016-10-04 LAB — BASIC METABOLIC PANEL
ANION GAP: 10 (ref 5–15)
Anion gap: 14 (ref 5–15)
BUN: 187 mg/dL — AB (ref 6–20)
BUN: 158 mg/dL — AB (ref 6–20)
CALCIUM: 7.6 mg/dL — AB (ref 8.9–10.3)
CHLORIDE: 120 mmol/L — AB (ref 101–111)
CO2: 19 mmol/L — AB (ref 22–32)
CO2: 21 mmol/L — AB (ref 22–32)
CREATININE: 5.77 mg/dL — AB (ref 0.61–1.24)
Calcium: 7.9 mg/dL — ABNORMAL LOW (ref 8.9–10.3)
Chloride: 113 mmol/L — ABNORMAL HIGH (ref 101–111)
Creatinine, Ser: 3.87 mg/dL — ABNORMAL HIGH (ref 0.61–1.24)
GFR calc non Af Amer: 8 mL/min — ABNORMAL LOW (ref >60)
GFR calc non Af Amer: 13 mL/min — ABNORMAL LOW (ref >60)
GFR, EST AFRICAN AMERICAN: 9 mL/min — AB (ref >60)
GFR, EST AFRICAN AMERICAN: 15 mL/min — AB (ref >60)
GLUCOSE: 110 mg/dL — AB (ref 65–99)
Glucose, Bld: 110 mg/dL — ABNORMAL HIGH (ref 65–99)
Potassium: 4.2 mmol/L (ref 3.5–5.1)
Potassium: 4.1 mmol/L (ref 3.5–5.1)
SODIUM: 151 mmol/L — AB (ref 135–145)
Sodium: 146 mmol/L — ABNORMAL HIGH (ref 135–145)

## 2016-10-04 LAB — CBC
HCT: 32.8 % — ABNORMAL LOW (ref 39.0–52.0)
Hemoglobin: 10.8 g/dL — ABNORMAL LOW (ref 13.0–17.0)
MCH: 27.8 pg (ref 26.0–34.0)
MCHC: 32.9 g/dL (ref 30.0–36.0)
MCV: 84.5 fL (ref 78.0–100.0)
PLATELETS: 258 10*3/uL (ref 150–400)
RBC: 3.88 MIL/uL — AB (ref 4.22–5.81)
RDW: 15.3 % (ref 11.5–15.5)
WBC: 17.7 10*3/uL — ABNORMAL HIGH (ref 4.0–10.5)

## 2016-10-04 LAB — CREATININE, URINE, RANDOM: Creatinine, Urine: 130.7 mg/dL

## 2016-10-04 LAB — SODIUM, URINE, RANDOM: SODIUM UR: 28 mmol/L

## 2016-10-04 LAB — MRSA PCR SCREENING: MRSA by PCR: NEGATIVE

## 2016-10-04 LAB — PROCALCITONIN: Procalcitonin: 1.02 ng/mL

## 2016-10-04 LAB — MAGNESIUM: Magnesium: 2.8 mg/dL — ABNORMAL HIGH (ref 1.7–2.4)

## 2016-10-04 LAB — PHOSPHORUS: Phosphorus: 6.9 mg/dL — ABNORMAL HIGH (ref 2.5–4.6)

## 2016-10-04 LAB — LACTIC ACID, PLASMA: Lactic Acid, Venous: 1.3 mmol/L (ref 0.5–1.9)

## 2016-10-04 MED ORDER — TAMSULOSIN HCL 0.4 MG PO CAPS
0.4000 mg | ORAL_CAPSULE | Freq: Every day | ORAL | Status: DC
  Administered 2016-10-08 – 2016-10-10 (×3): 0.4 mg via ORAL
  Filled 2016-10-04 (×3): qty 1

## 2016-10-04 MED ORDER — CARBIDOPA-LEVODOPA 10-100 MG PO TABS
2.0000 | ORAL_TABLET | Freq: Three times a day (TID) | ORAL | Status: DC
  Filled 2016-10-04: qty 2

## 2016-10-04 MED ORDER — CARBIDOPA-LEVODOPA 10-100 MG PO TABS
2.0000 | ORAL_TABLET | Freq: Three times a day (TID) | ORAL | Status: DC
  Filled 2016-10-04 (×11): qty 2

## 2016-10-04 MED ORDER — ENOXAPARIN SODIUM 30 MG/0.3ML ~~LOC~~ SOLN
30.0000 mg | SUBCUTANEOUS | Status: DC
  Administered 2016-10-04 – 2016-10-06 (×3): 30 mg via SUBCUTANEOUS
  Filled 2016-10-04 (×3): qty 0.3

## 2016-10-04 MED ORDER — ALBUTEROL SULFATE (2.5 MG/3ML) 0.083% IN NEBU: 2.5000 mg | INHALATION_SOLUTION | RESPIRATORY_TRACT | Status: DC | PRN

## 2016-10-04 MED ORDER — ALBUTEROL SULFATE (2.5 MG/3ML) 0.083% IN NEBU
2.5000 mg | INHALATION_SOLUTION | Freq: Four times a day (QID) | RESPIRATORY_TRACT | Status: DC
  Administered 2016-10-04 – 2016-10-05 (×2): 2.5 mg via RESPIRATORY_TRACT
  Filled 2016-10-04 (×2): qty 3

## 2016-10-04 MED ORDER — ONDANSETRON HCL 4 MG PO TABS: 4.0000 mg | ORAL_TABLET | Freq: Four times a day (QID) | ORAL | Status: DC | PRN

## 2016-10-04 MED ORDER — DEXTROSE 5 % IV SOLN
500.0000 mg | INTRAVENOUS | Status: DC
  Administered 2016-10-04: 500 mg via INTRAVENOUS
  Filled 2016-10-04 (×2): qty 0.5

## 2016-10-04 MED ORDER — ATORVASTATIN CALCIUM 40 MG PO TABS
40.0000 mg | ORAL_TABLET | Freq: Every day | ORAL | Status: DC
  Administered 2016-10-09: 40 mg via ORAL
  Filled 2016-10-04: qty 1

## 2016-10-04 MED ORDER — GUAIFENESIN ER 600 MG PO TB12
600.0000 mg | ORAL_TABLET | Freq: Two times a day (BID) | ORAL | Status: DC
  Filled 2016-10-04: qty 1

## 2016-10-04 MED ORDER — ONDANSETRON HCL 4 MG/2ML IJ SOLN: 4.0000 mg | Freq: Four times a day (QID) | INTRAMUSCULAR | Status: DC | PRN

## 2016-10-04 MED ORDER — ACETAMINOPHEN 325 MG PO TABS
650.0000 mg | ORAL_TABLET | Freq: Four times a day (QID) | ORAL | Status: DC | PRN
  Administered 2016-10-09 – 2016-10-16 (×3): 650 mg via ORAL
  Filled 2016-10-04 (×3): qty 2

## 2016-10-04 MED ORDER — ACETAMINOPHEN 650 MG RE SUPP
650.0000 mg | Freq: Four times a day (QID) | RECTAL | Status: DC | PRN
  Administered 2016-10-05 – 2016-10-07 (×4): 650 mg via RECTAL
  Filled 2016-10-04 (×4): qty 1

## 2016-10-04 MED ORDER — SODIUM CHLORIDE 0.9 % IV SOLN
INTRAVENOUS | Status: DC
  Administered 2016-10-04 – 2016-10-05 (×2): via INTRAVENOUS

## 2016-10-04 NOTE — Progress Notes (Signed)
Pharmacy Antibiotic Note  Ryan Cunningham is a 83 y.o. male admitted on 10/03/2016 with sepsis. Pharmacy consulted to adjust antibiotic regimen to Ceftazidime + Vancomycin today. The patient has an allergy to penicillins listed as a rash however has tolerated cephalosporins previously.  The patient is noted to be in AKI with admit SCr 8.01 (baseline was 1-1.3 earlier this month). The patient's SCr has trended down with IVF, SCr down to 5.77 << 8.04, CrCl<15 ml/min but UOP good.  Plan: 1. D/c LVQ + Azactam 2. Start Ceftazidime 500 mg IV every 24 hours 3. No standing Vancomycin for now - if renal function continues to improve, will consider starting q48h dosing on 9/27 evening 4. Will continue to follow HD schedule/duration, culture results, LOT, and antibiotic de-escalation plans    Height:  (188 cm) Weight: 186 lb 8.2 oz (84.6 kg) IBW/kg (Calculated) : 82.2  Temp (24hrs), Avg:98.3 F (36.8 C), Min:97.8 F (36.6 C), Max:98.8 F (37.1 C)   Recent Labs Lab 10/03/16 1908 10/03/16 1914 10/03/16 2257 10/04/16 0241 10/04/16 0242  WBC 22.4*  --   --   --  17.7*  CREATININE 8.04*  --   --   --  5.77*  LATICACIDVEN  --  2.47* 1.5 1.3  --     Estimated Creatinine Clearance: 10.5 mL/min (A) (by C-G formula based on SCr of 5.77 mg/dL (H)).    Allergies  Allergen Reactions  . Penicillins Rash    Has patient had a PCN reaction causing immediate rash, facial/tongue/throat swelling, SOB or lightheadedness with hypotension: Yes Has patient had a PCN reaction causing severe rash involving mucus membranes or skin necrosis: No Has patient had a PCN reaction that required hospitalization: No Has patient had a PCN reaction occurring within the last 10 years: No If all of the above answers are "NO", then may proceed with Cephalosporin use.     Antimicrobials this admission: Vanc 9/25 >> * Received 1500 mg LD on 9/25 evening Azactam 9/25 >> 9/26 LVQ 9/25 >> 9/26 Ceftaz 9/26 >>  Dose  adjustments this admission: n/a  Microbiology results: 9/25 blood cx >> 9/25 urine cx >> 9/26 MRSA PCR >> negative  Thank you for allowing pharmacy to be a part of this patient's care.  Georgina Pillion, PharmD, BCPS Clinical Pharmacist Pager: [phone] Clinical phone for 10/04/2016 from 7a-3:30p: [phone] If after 3:30p, please call main pharmacy at: [phone] 10/04/2016 10:00 AM

## 2016-10-04 NOTE — Care Management Note (Signed)
Case Management Note  Patient Details  Name: Ryan Cunningham MRN: [id number] Date of Birth: 24-Aug-1927  Subjective/Objective:                 Admitted with AKI. Recent DC to SNF. Consult to CSW. Anticipate IV fluids, trend Cr. Empiric antibiotics for now, follow culture data. Liekly 4-5 days admission   Action/Plan:  Family would like [facility] at DC, CSW following.  Expected Discharge Date:                  Expected Discharge Plan:  Skilled Nursing Facility  In-House Referral:  Clinical Social Work  Discharge planning Services  CM Consult  Post Acute Care Choice:    Choice offered to:     DME Arranged:    DME Agency:     HH Arranged:    HH Agency:     Status of Service:  In process, will continue to follow  If discussed at Long Length of Stay Meetings, dates discussed:    Additional Comments:  Lawerance Sabal, RN 10/04/2016, 3:57 PM

## 2016-10-04 NOTE — Progress Notes (Signed)
[facility] PROGRESS NOTE  LOCHLANN MASTRANGELO ZOX:[id number] DOB: July 11, 1927 DOA: 10/03/2016 PCP: Jarome Matin, MD   HPI/Subjective: No issues overnight.  Objective: Vitals:   10/04/16 0448 10/04/16 0900  BP: (!) 113/48 (!) 162/47  Pulse: (!) 55 69  Resp: 19 18  Temp: 98.8 F (37.1 C) (!) 97.5 F (36.4 C)  SpO2: 100% 98%    Intake/Output Summary (Last 24 hours) at 10/04/16 1611 Last data filed at 10/04/16 1506  Gross per 24 hour  Intake          3846.67 ml  Output             2575 ml  Net          1271.67 ml   Filed Weights   10/03/16 1909 10/04/16 0141  Weight: 84.8 kg (187 lb) 84.6 kg (186 lb 8.2 oz)    Exam:   General:  NAD, NCAT  Cardiovascular: RRR, no MRG  Respiratory: CTAB, nl wob  Abdomen: ND, NTTP  Data Reviewed: Basic Metabolic Panel:  Recent Labs Lab 10/03/16 1908 10/04/16 0242 10/04/16 0822 10/04/16 1446  NA 142 146*  --  151*  K 5.5* 4.2  --  4.1  CL 108 113*  --  120*  CO2 19* 19*  --  21*  GLUCOSE 143* 110*  --  110*  BUN 208* 187*  --  158*  CREATININE 8.04* 5.77*  --  3.87*  CALCIUM 8.2* 7.6*  --  7.9*  MG  --   --  2.8*  --   PHOS  --   --  6.9*  --    Liver Function Tests:  Recent Labs Lab 10/03/16 1908  AST 41  ALT 6*  ALKPHOS 209*  BILITOT 0.9  PROT 7.6  ALBUMIN 2.4*   No results for input(s): LIPASE, AMYLASE in the last 168 hours.  Recent Labs Lab 10/03/16 2257  AMMONIA 27   CBC:  Recent Labs Lab 10/03/16 1908 10/04/16 0242  WBC 22.4* 17.7*  NEUTROABS 20.1*  --   HGB 12.8* 10.8*  HCT 40.0 32.8*  MCV 85.5 84.5  PLT 310 258   Cardiac Enzymes:  Recent Labs Lab 10/03/16 1908  CKTOTAL 49   BNP (last 3 results)  Recent Labs  09/18/16 1909  BNP 651.8*    ProBNP (last 3 results) No results for input(s): PROBNP in the last 8760 hours.  CBG: No results for input(s): GLUCAP in the last 168 hours.  Recent Results (from the past 240 hour(s))  MRSA PCR Screening     Status: None    Collection Time: 10/04/16  2:46 AM  Result Value Ref Range Status   MRSA by PCR NEGATIVE NEGATIVE Final    Comment:        The GeneXpert MRSA Assay (FDA approved for NASAL specimens only), is one component of a comprehensive MRSA colonization surveillance program. It is not intended to diagnose MRSA infection nor to guide or monitor treatment for MRSA infections.      Studies: Ct Head Wo Contrast  Result Date: 10/03/2016 CLINICAL DATA:  Altered level of consciousness.  Syncope. EXAM: CT HEAD WITHOUT CONTRAST TECHNIQUE: Contiguous axial images were obtained from the base of the skull through the vertex without intravenous contrast. COMPARISON:  Head CT 09/21/2016 FINDINGS: Brain: Stable degree of atrophy and chronic small vessel ischemia from prior. No intracranial hemorrhage, mass effect, or midline shift. No hydrocephalus. The basilar cisterns are patent. No evidence of territorial infarct or acute ischemia. No  extra-axial or intracranial fluid collection. Vascular: Atherosclerosis of skullbase vasculature without hyperdense vessel or abnormal calcification. Skull: No fracture or focal lesion. Sinuses/Orbits: Paranasal sinuses and mastoid air cells are clear. The visualized orbits are unremarkable. Bilateral cataract section. Other: None. IMPRESSION: No acute intracranial abnormality. Electronically Signed   By: Rubye Oaks M.D.   On: 10/03/2016 22:51   US Renal  Result Date: 10/04/2016 CLINICAL DATA:  83 year old with acute renal failure. EXAM: RENAL / URINARY TRACT ULTRASOUND COMPLETE COMPARISON:  None. FINDINGS: Right Kidney: Length: 10.9 cm. Mild to moderate hydronephrosis. Mild thinning of the renal parenchyma. No focal renal mass. Left Kidney: Length: 1.8 cm. Mild to moderate hydronephrosis. Mild thinning of the renal parenchyma. No focal renal mass. Bladder: Decompressed by Foley catheter. IMPRESSION: Mild to moderate bilateral hydronephrosis. Urinary bladder is decompressed by  Foley catheter. Electronically Signed   By: Rubye Oaks M.D.   On: 10/04/2016 06:35   Dg Chest Portable 1 View  Result Date: 10/03/2016 CLINICAL DATA:  AMS EXAM: PORTABLE CHEST 1 VIEW COMPARISON:  09/18/2016 FINDINGS: The heart is enlarged. There is pulmonary vascular congestion. No overt edema. There has been some improvement in aeration of the left lung base. Persistent atelectasis or resolving infiltrate remains. IMPRESSION: 1. Cardiomegaly and vascular congestion. 2. Improving aeration at the left lung base with persistent atelectasis or infiltrate. Electronically Signed   By: Norva Pavlov M.D.   On: 10/03/2016 20:34   Ct Renal Stone Study  Result Date: 10/04/2016 CLINICAL DATA:  83 year old with acute renal failure and unexplained hematuria. Urinary tract ultrasound earlier today demonstrated bilateral hydronephrosis. EXAM: CT ABDOMEN AND PELVIS WITHOUT CONTRAST TECHNIQUE: Multidetector CT imaging of the abdomen and pelvis was performed following the standard protocol without IV contrast. COMPARISON:  Urinary tract ultrasound performed earlier today. No prior CT. FINDINGS: Because the patient was unable to follow breathing instructions due to acute mental status changes, respiratory motion blurs many of the images of the upper abdomen. Lower chest: Airspace opacities in the dependent portions of both lower lobes, right greater than left. Heart moderately enlarged. Aortic annular calcification. Mild mitral annular calcification. No pericardial effusion. Very small right pleural effusion. Hepatobiliary: Normal unenhanced appearance of the liver. Calcified gallstones in the gallbladder. No CT evidence of acute cholecystitis. No biliary ductal dilation. Pancreas: Normal unenhanced appearance. Spleen: Calcified granulomas in the lower pole. No significant focal abnormality, allowing for unenhanced technique. Normal in size. Adrenals/Urinary Tract: Normal appearing adrenal glands. Bilateral  hydronephrosis as noted on ultrasound earlier today. High attenuation material in the right renal collecting system. No urinary tract calculi on either side. Within the limits of the unenhanced technique, no renal parenchymal masses. Urinary bladder decompressed by Foley catheter, accounting for the gas bubble within the bladder. Diverticulum arising from the left superolateral bladder wall. Stomach/Bowel: Stomach decompressed and unremarkable. Normal-appearing small bowel. Entire colon relatively decompressed which likely accounts for the apparent wall thickening. Mobile cecum present in the anterior mid abdomen. Appendix not visible, but no pericecal inflammation. Vascular/Lymphatic: Severe aortoiliofemoral atherosclerosis without evidence of aneurysm. Scattered calcified lymph nodes in the gastrohepatic ligament and porta hepatis. No pathologic lymphadenopathy. Reproductive: Marked prostate gland enlargement, particularly the median lobe. Prostate tissue very near the ureteral orifices on both sides of the urinary bladder. Normal seminal vesicles. Other: Benign appearing retroperitoneal cyst in the right upper quadrant, posterior to the liver and right kidney, measuring approximately 5.0 x 9.3 x 12.6 cm. Mild edema involving the subcutaneous tissues of the dependent portions of the pelvis.  Musculoskeletal: Osseous demineralization. No acute osseous abnormality. Degenerative disc disease at L4-5 and L5-S1 with bridging anterior osteophyte at L5-S1. Facet degenerative changes diffusely throughout the lumbar spine. Severe multifactorial spinal stenosis at L3-4. IMPRESSION: 1. Bilateral hydronephrosis as noted on ultrasound earlier today. No urinary tract calculi and no visible renal parenchymal masses to explain hematuria. High attenuation material in the right renal collecting system likely represents blood (though infected urine and a transitional cell carcinoma are also in the differential diagnosis). 2.  Bilateral lower lobe atelectasis and/or pneumonia. 3. Likely benign large retroperitoneal cyst in the right upper quadrant posterior to the liver in right kidney, measured above. 4. Cholelithiasis without CT evidence of acute cholecystitis. 5. Old granulomatous disease involving the spleen and upper abdominal lymph nodes. No pathologic lymphadenopathy. 6. Marked prostate gland enlargement. The prostatic tissue is very close to the ureteral orifices on both sides of the bladder and may account for the hydronephrosis. 7. Severe multifactorial spinal stenosis at L3-4. Aortic Atherosclerosis (ICD10-170.0) Electronically Signed   By: Hulan Saas M.D.   On: 10/04/2016 13:49    Scheduled Meds: . atorvastatin  40 mg Oral q1800  . carbidopa-levodopa  2 tablet Oral TID WC  . enoxaparin (LOVENOX) injection  30 mg Subcutaneous Q24H  . tamsulosin  0.4 mg Oral Daily   Continuous Infusions: . sodium chloride 125 mL/hr at 10/04/16 0242  . cefTAZidime (FORTAZ)  IV Stopped (10/04/16 1230)    Principal Problem:   Acute renal failure (ARF) (HCC) Active Problems:   Parkinson disease (HCC)   Essential hypertension   Acute encephalopathy   Foot ulcer (HCC)   Hyperkalemia   Sepsis (HCC)   Assessment/Plan:  . Acute renal failure:  Off diuretics. ARB contributing, no NSAIDs. However suspect this is actually from obstruction or infection. No previous history of obstruction.   -Maintain Foley, on d/c send home with leg back -Check urine lites, Still awaiting -Continue IV fluids and trend creatinine -Follow intake and output, still making urine -Hold ARB, will never restart per nephrology -Obtain renal ultrasound, Shows hydronephrosis from attention   2. Possible sepsis, will rule out:  Suspected source unclear.  Patient meets criteria given tachypnea, leukocytosis, and evidence of organ dysfunction, although there are alternative explanations for his elevated lactic acid and leukocytosis. Poss  CAP based on imgs. Ordered: IS, albuteril qid, albuterol prn, mucinex  -Sepsis bundle utilized:             -Blood and urine cultures drawn             -Fluid bolus given in ED, Lan wnl now             -Antibiotics: vancomycin/fortaz             -Repeat renal function and complete blood count in AM             -Low threshold to de-escalate antibiotics   3. Parkinson's:  -Continue Sinemet -Hold depakote  4. Acute encephalopathy:  No dementia at baseline, suspect some MCI.  Encephalopathy likely from uremia, AKI.   -Check depakote (low) and ammonia (wnl) levels -Check CT head (recent fall/head injury), neg  5. Hypertension:  -Hold ARB, do not restart on d/c  6. Foot wounds:  This appears baseline -Consult WOC  7. Hyperkalemia, resolved:  Problem #1 above. ECG stable. -IV fluids -Trend K     DVT prophylaxis: Lovenox, low dose  Code Status: DO NOT RESUSCITATE  Family Communication: Daughter and son in law at  [place]  Disposition Plan: Anticipate IV fluids, trend Cr.  Empiric antibiotics for now, follow culture data.  [place] 4-5 days admission. Consults called: None  Admission status: INPATIENT    Haydee Salter  AMION. If 7PM-7AM, please contact night-coverage at [email or web address], password [facility] 10/04/2016, 4:11 PM  LOS: 1 day

## 2016-10-04 NOTE — Consult Note (Signed)
WOC Nurse wound consult note Reason for Consult: Consult requested for bilat feet. Left heel with intact skin Right heel with previous blister which has ruptured, evolving into 50% dark purple deep tissue injury and 50% red and moist stage 2 pressure injury, mod amt pink drainage, no odor, 5X5.5X.2cm Right inner foot with dry callous 1X1cm which removed easily, revealing dark brown wound bed; .1X.2X.1cm, no odor or drainage Left plantar foot with full thickness wound; 1.5X1.5X.2cm, red moist wound bed, small amt yellow drainage, no odor Pressure Injury POA: Yes Dressing procedure/placement/frequency: Float heels in Prevalon boots to reduce pressure,  Foam dressing to right inner foot to protect and promote healing.  Xerofrom gauze to promote drying and healing to right heel and left plantar foot.  No family present to discuss plan of care and pt does not appear to understand. Please re-consult if further assistance is needed.  Thank-you,  Cammie Mcgee MSN, RN, CWOCN, [place], CNS [phone]

## 2016-10-04 NOTE — Consult Note (Signed)
[facility] Renal Consultation Note  Requesting MD: Aggie Moats Indication for Consultation: acute kidney injury  HPI:  Ryan Cunningham is a 83 y.o. male with past medical history significant for Parkinson's disease, hypertension, type 2 diabetes mellitus, PAD with an ulcer but supposedly fairly high functioning, still lows grass, drives and does not have any help in the home. Patient was hospitalized from 9/10 to 9/13, diagnosed with pneumonia treated with Levaquin.  Creatinine noted to be 1.0 on 9/13.  He was brought back 9/25 with decreased mental status.  Labs showed a creatinine level of 8. Of note he was on Cozaar 100 as an outpatient.  Creatinine level done about 8 hours later was 5.7. He has had 2500 of urine output at least since admission.  Renal ultrasound showed bilateral mild to moderate hydronephrosis but now Foley is in place.  Creatinine, Ser  Date/Time Value Ref Range Status  10/04/2016 02:42 AM 5.77 (H) 0.61 - 1.24 mg/dL Final  10/03/2016 07:08 PM 8.04 (H) 0.61 - 1.24 mg/dL Final  09/21/2016 08:36 PM 1.01 0.61 - 1.24 mg/dL Final  09/21/2016 04:22 AM 0.91 0.61 - 1.24 mg/dL Final  09/19/2016 12:58 AM 1.11 0.61 - 1.24 mg/dL Final  09/18/2016 07:08 PM 1.23 0.61 - 1.24 mg/dL Final  07/16/2014 08:23 AM 1.39 (H) 0.76 - 1.27 mg/dL Final  01/20/2014 03:27 PM 1.16 0.76 - 1.27 mg/dL Final  07/17/2013 08:28 AM 1.31 (H) 0.76 - 1.27 mg/dL Final  01/20/2013 08:28 AM 1.20 0.76 - 1.27 mg/dL Final  07/26/2012 08:46 AM 1.36 (H) 0.76 - 1.27 mg/dL Final  03/21/2010 11:58 PM 1.39 0.4 - 1.5 mg/dL Final     PMHx:   Past Medical History:  Diagnosis Date  . Acute bronchitis   . Acute bronchitis   . Acute maxillary sinusitis   . Atrial fibrillation ([place])   . Cardiomegaly   . Cellulitis and abscess of foot, except toes   . Cervicalgia   . Chronic kidney disease, unspecified   . Congestive heart failure, unspecified   . Coronary atherosclerosis of unspecified type of vessel,  native or graft   . Edema   . Encounter for long-term (current) use of other medications   . Essential and other specified forms of tremor   . Impotence of organic origin   . Obstructive sleep apnea (adult) (pediatric)   . Other and unspecified hyperlipidemia   . Other atopic dermatitis and related conditions   . Other specified idiopathic peripheral neuropathy   . Paralysis agitans ([place])   . Peripheral vascular disease, unspecified ([place])   . Pressure ulcer, other site(707.09)   . Right bundle branch block and left anterior fascicular block   . Seborrhea   . Syncope and collapse   . Type II or unspecified type diabetes mellitus without mention of complication, not stated as uncontrolled   . Ulcer of lower limb, unspecified   . Ulcer of other part of foot   . Unspecified essential hypertension   . Urinary frequency     Past Surgical History:  Procedure Laterality Date  . [place]   Dr.Ames   . CATARACT EXTRACTION, BILATERAL  2008   Dr.Hecker   . CERVICAL DISCECTOMY  2004   Dr.Kritzer  . CERVICAL SPINE SURGERY  1988   [place]  . COLONOSCOPY  06/08/2009   Dr.John Amedeo Plenty   . KNEE SURGERY  1974  . [place]  . [place]  . SHOULDER SURGERY  [place]    Family Hx:  Family History  Problem Relation Age of Onset  . Heart disease Father        CVA  . Heart disease Brother   . Heart disease Brother   . Heart disease Brother        MI  . Cancer Brother     Social History:  reports that he has quit smoking. His smoking use included Cigarettes. He has never used smokeless tobacco. He reports that he does not drink alcohol or use drugs.  Allergies:  Allergies  Allergen Reactions  . Penicillins Rash    Has patient had a PCN reaction causing immediate rash, facial/tongue/throat swelling, SOB or lightheadedness with hypotension: Yes Has patient had a PCN reaction causing severe rash involving mucus membranes  or skin necrosis: No Has patient had a PCN reaction that required hospitalization: No Has patient had a PCN reaction occurring within the last 10 years: No If all of the above answers are "NO", then may proceed with Cephalosporin use.     Medications: Prior to Admission medications   Medication Sig Start Date End Date Taking? Authorizing Provider  carbidopa-levodopa (SINEMET IR) 10-100 MG per tablet Take 2 tablets by mouth 3 (three) times daily. To help tremor. 08/05/14  Yes Gildardo Cranker, DO  divalproex (DEPAKOTE SPRINKLE) 125 MG capsule Take 125 mg by mouth 2 (two) times daily.   Yes [provider]  ipratropium (ATROVENT) 0.03 % nasal spray 1 spray as directed. 1 spray into mouth up to three times daily as directed to lessen drooling. 08/10/16  Yes [provider]  losartan (COZAAR) 100 MG tablet Take 100 mg by mouth daily.  07/18/16  Yes [provider]  [employer]. Devices (COVERALL BOOTS/DISPOSABLE/UNIV) MISC by Does not apply route. Prevalon Boots---Ensure Boots are on bilateral feet at all times while in the bed   Yes [provider]  Multiple Vitamins-Minerals (ICAPS AREDS FORMULA PO) Take 1 tablet by mouth daily.   Yes [provider]  simvastatin (ZOCOR) 80 MG tablet Take one half tablet by mouth daily to lower cholesterol. 08/05/14  Yes Carter, Monica, DO  SSD 1 % cream Apply 1 application topically 2 (two) times daily as needed (foot ulcer).  07/31/16   [provider]    I have reviewed the patient's current medications.  Labs:  Results for orders placed or performed during the hospital encounter of 10/03/16 (from the past 48 hour(s))  Comprehensive metabolic panel     Status: Abnormal   Collection Time: 10/03/16  7:08 PM  Result Value Ref Range   Sodium 142 135 - 145 mmol/L   Potassium 5.5 (H) 3.5 - 5.1 mmol/L   Chloride 108 101 - 111 mmol/L   CO2 19 (L) 22 - 32 mmol/L   Glucose, Bld 143 (H) 65 - 99 mg/dL   BUN 208 (H) 6 - 20  mg/dL   Creatinine, Ser 8.04 (H) 0.61 - 1.24 mg/dL   Calcium 8.2 (L) 8.9 - 10.3 mg/dL   Total Protein 7.6 6.5 - 8.1 g/dL   Albumin 2.4 (L) 3.5 - 5.0 g/dL   AST 41 15 - 41 U/L   ALT 6 (L) 17 - 63 U/L   Alkaline Phosphatase 209 (H) 38 - 126 U/L   Total Bilirubin 0.9 0.3 - 1.2 mg/dL   GFR calc non Af Amer 5 (L) >60 mL/min   GFR calc Af Amer 6 (L) >60 mL/min  Comment: (NOTE) The eGFR has been calculated using the CKD EPI equation. This calculation has not been validated in all clinical situations. eGFR's persistently <60 mL/min signify possible Chronic Kidney Disease.    Anion gap 15 5 - 15  CBC with Differential     Status: Abnormal   Collection Time: 10/03/16  7:08 PM  Result Value Ref Range   WBC 22.4 (H) 4.0 - 10.5 K/uL   RBC 4.68 4.22 - 5.81 MIL/uL   Hemoglobin 12.8 (L) 13.0 - 17.0 g/dL   HCT 40.0 39.0 - 52.0 %   MCV 85.5 78.0 - 100.0 fL   MCH 27.4 26.0 - 34.0 pg   MCHC 32.0 30.0 - 36.0 g/dL   RDW 15.3 11.5 - 15.5 %   Platelets 310 150 - 400 K/uL   Neutrophils Relative % 90 %   Lymphocytes Relative 3 %   Monocytes Relative 7 %   Eosinophils Relative 0 %   Basophils Relative 0 %   Neutro Abs 20.1 (H) 1.7 - 7.7 K/uL   Lymphs Abs 0.7 0.7 - 4.0 K/uL   Monocytes Absolute 1.6 (H) 0.1 - 1.0 K/uL   Eosinophils Absolute 0.0 0.0 - 0.7 K/uL   Basophils Absolute 0.0 0.0 - 0.1 K/uL   Smear Review MORPHOLOGY UNREMARKABLE   CK     Status: None   Collection Time: 10/03/16  7:08 PM  Result Value Ref Range   Total CK 49 49 - 397 U/L  I-Stat CG4 Lactic Acid, ED     Status: Abnormal   Collection Time: 10/03/16  7:14 PM  Result Value Ref Range   Lactic Acid, Venous 2.47 (HH) 0.5 - 1.9 mmol/L   Comment NOTIFIED PHYSICIAN   Urinalysis, Routine w reflex microscopic     Status: Abnormal   Collection Time: 10/03/16  8:20 PM  Result Value Ref Range   Color, Urine YELLOW YELLOW   APPearance CLEAR CLEAR   Specific Gravity, Urine 1.014 1.005 - 1.030   pH 5.0 5.0 - 8.0   Glucose, UA  NEGATIVE NEGATIVE mg/dL   Hgb urine dipstick MODERATE (A) NEGATIVE   Bilirubin Urine NEGATIVE NEGATIVE   Ketones, ur NEGATIVE NEGATIVE mg/dL   Protein, ur NEGATIVE NEGATIVE mg/dL   Nitrite NEGATIVE NEGATIVE   Leukocytes, UA NEGATIVE NEGATIVE   RBC / HPF TOO NUMEROUS TO COUNT 0 - 5 RBC/hpf   WBC, UA 0-5 0 - 5 WBC/hpf   Bacteria, UA RARE (A) NONE SEEN   Squamous Epithelial / LPF NONE SEEN NONE SEEN   Mucus PRESENT    Hyaline Casts, UA PRESENT   Sodium, urine, random     Status: None   Collection Time: 10/03/16  8:20 PM  Result Value Ref Range   Sodium, Ur 28 mmol/L  Creatinine, urine, random     Status: None   Collection Time: 10/03/16  8:20 PM  Result Value Ref Range   Creatinine, Urine 130.70 mg/dL  Lactic acid, plasma     Status: None   Collection Time: 10/03/16 10:57 PM  Result Value Ref Range   Lactic Acid, Venous 1.5 0.5 - 1.9 mmol/L  Ammonia     Status: None   Collection Time: 10/03/16 10:57 PM  Result Value Ref Range   Ammonia 27 9 - 35 umol/L  Valproic acid level     Status: Abnormal   Collection Time: 10/03/16 10:57 PM  Result Value Ref Range   Valproic Acid Lvl <10 (L) 50.0 - 100.0 ug/mL    Comment:  RESULTS CONFIRMED BY MANUAL DILUTION  Lactic acid, plasma     Status: None   Collection Time: 10/04/16  2:41 AM  Result Value Ref Range   Lactic Acid, Venous 1.3 0.5 - 1.9 mmol/L  Basic metabolic panel     Status: Abnormal   Collection Time: 10/04/16  2:42 AM  Result Value Ref Range   Sodium 146 (H) 135 - 145 mmol/L   Potassium 4.2 3.5 - 5.1 mmol/L    Comment: DELTA CHECK NOTED   Chloride 113 (H) 101 - 111 mmol/L   CO2 19 (L) 22 - 32 mmol/L   Glucose, Bld 110 (H) 65 - 99 mg/dL   BUN 187 (H) 6 - 20 mg/dL   Creatinine, Ser 5.77 (H) 0.61 - 1.24 mg/dL   Calcium 7.6 (L) 8.9 - 10.3 mg/dL   GFR calc non Af Amer 8 (L) >60 mL/min   GFR calc Af Amer 9 (L) >60 mL/min    Comment: (NOTE) The eGFR has been calculated using the CKD EPI equation. This calculation has not  been validated in all clinical situations. eGFR's persistently <60 mL/min signify possible Chronic Kidney Disease.    Anion gap 14 5 - 15  CBC     Status: Abnormal   Collection Time: 10/04/16  2:42 AM  Result Value Ref Range   WBC 17.7 (H) 4.0 - 10.5 K/uL   RBC 3.88 (L) 4.22 - 5.81 MIL/uL   Hemoglobin 10.8 (L) 13.0 - 17.0 g/dL   HCT 32.8 (L) 39.0 - 52.0 %   MCV 84.5 78.0 - 100.0 fL   MCH 27.8 26.0 - 34.0 pg   MCHC 32.9 30.0 - 36.0 g/dL   RDW 15.3 11.5 - 15.5 %   Platelets 258 150 - 400 K/uL  Procalcitonin     Status: None   Collection Time: 10/04/16  2:42 AM  Result Value Ref Range   Procalcitonin 1.02 ng/mL    Comment:        Interpretation: PCT > 0.5 ng/mL and <= 2 ng/mL: Systemic infection (sepsis) is possible, but other conditions are known to elevate PCT as well. (NOTE)         ICU PCT Algorithm               Non ICU PCT Algorithm    ----------------------------     ------------------------------         PCT < 0.25 ng/mL                 PCT < 0.1 ng/mL     Stopping of antibiotics            Stopping of antibiotics       strongly encouraged.               strongly encouraged.    ----------------------------     ------------------------------       PCT level decrease by               PCT < 0.25 ng/mL       >= 80% from peak PCT       OR PCT 0.25 - 0.5 ng/mL          Stopping of antibiotics                                             encouraged.  Stopping of antibiotics           encouraged.    ----------------------------     ------------------------------       PCT level decrease by              PCT >= 0.25 ng/mL       < 80% from peak PCT        AND PCT >= 0.5 ng/mL             Continuing antibiotics                                              encouraged.       Continuing antibiotics            encouraged.    ----------------------------     ------------------------------     PCT level increase compared          PCT > 0.5 ng/mL         with peak PCT AND           PCT >= 0.5 ng/mL             Escalation of antibiotics                                          strongly encouraged.      Escalation of antibiotics        strongly encouraged.   MRSA PCR Screening     Status: None   Collection Time: 10/04/16  2:46 AM  Result Value Ref Range   MRSA by PCR NEGATIVE NEGATIVE    Comment:        The GeneXpert MRSA Assay (FDA approved for NASAL specimens only), is one component of a comprehensive MRSA colonization surveillance program. It is not intended to diagnose MRSA infection nor to guide or monitor treatment for MRSA infections.   Magnesium     Status: Abnormal   Collection Time: 10/04/16  8:22 AM  Result Value Ref Range   Magnesium 2.8 (H) 1.7 - 2.4 mg/dL  Phosphorus     Status: Abnormal   Collection Time: 10/04/16  8:22 AM  Result Value Ref Range   Phosphorus 6.9 (H) 2.5 - 4.6 mg/dL     ROS:  Review of systems not obtained due to patient factors. according to patient's wife he had been going to the bathroom every 15 minutes  Physical Exam: Vitals:   10/04/16 0448 10/04/16 0900  BP: (!) 113/48 (!) 162/47  Pulse: (!) 55 69  Resp: 19 18  Temp: 98.8 F (37.1 C) (!) 97.5 F (36.4 C)  SpO2: 100% 98%     General: completely obtunded HEENT: pupils are reactive Neck: positive for JVD Heart: regular rate and rhythm Lungs: poor effort Abdomen: soft, nontender Extremities: pitting edema to dependent areas. Bilateral boots Skin: warm and dry Neuro: unresponsive  Assessment/Plan: 83 year old white male with acute kidney injury, clinical urinary obstruction and ARB use 1.Renal- acute kidney injury- I suspect the main component is of urinary obstruction. Renal ultrasound showed hydronephrosis. After Foley catheter placed significant amounts of urinary output and improvement of BUN and creatinine so that seems to have fixed the obstruction for now.  Would keep Foley in for now and will likely  need urology input at some point for  definitive treatment.  Will check BMP now and another set of chemistries in the morning 2. Hypertension/volume  - volume overload due to urinary obstruction. Polyuria right now.  I agree with IV fluids right now so that he does not become hypovolemic causes polyuria. I would never put this man on Cozaar again 3. Anemia  - situational, no treatment needed right now   Jewel Mcafee A 10/04/2016, 2:25 PM

## 2016-10-04 NOTE — Progress Notes (Signed)
RN  unable to finish admission history as patient is confused and no family member present. Ryan Cunningham, [name], Charity fundraiser

## 2016-10-04 NOTE — Progress Notes (Addendum)
CSW following to assist with disposition planning. Per previous admission, patient's full assessment completed on 09/21/16 and was discharged to [facility] for rehabilitation.  CSW to continue to follow for medical readiness and to facilitate discharge when stable.  Laveda Abbe, [place] Clinical Social Worker [phone]   UPDATE 11:50 AM:  CSW met with patient's wife and daughter at bedside to discuss experience at SNF. Patient's daughter and wife are happy with care received at [facility] at this time.   CSW to continue to follow for discharge planning.  Laveda Abbe, [place] Clinical Social Worker [phone]

## 2016-10-05 ENCOUNTER — Inpatient Hospital Stay (HOSPITAL_COMMUNITY): Payer: Medicare Other

## 2016-10-05 DIAGNOSIS — R05 Cough: Secondary | ICD-10-CM

## 2016-10-05 LAB — RENAL FUNCTION PANEL
ANION GAP: 9 (ref 5–15)
Albumin: 1.9 g/dL — ABNORMAL LOW (ref 3.5–5.0)
BUN: 128 mg/dL — ABNORMAL HIGH (ref 6–20)
CALCIUM: 7.9 mg/dL — AB (ref 8.9–10.3)
CO2: 19 mmol/L — AB (ref 22–32)
Chloride: 127 mmol/L — ABNORMAL HIGH (ref 101–111)
Creatinine, Ser: 2.46 mg/dL — ABNORMAL HIGH (ref 0.61–1.24)
GFR, EST AFRICAN AMERICAN: 26 mL/min — AB (ref >60)
GFR, EST NON AFRICAN AMERICAN: 22 mL/min — AB (ref >60)
Glucose, Bld: 87 mg/dL (ref 65–99)
PHOSPHORUS: 4.5 mg/dL (ref 2.5–4.6)
Potassium: 4.1 mmol/L (ref 3.5–5.1)
SODIUM: 155 mmol/L — AB (ref 135–145)

## 2016-10-05 LAB — BASIC METABOLIC PANEL
ANION GAP: 7 (ref 5–15)
BUN: 114 mg/dL — ABNORMAL HIGH (ref 6–20)
CHLORIDE: 127 mmol/L — AB (ref 101–111)
CO2: 22 mmol/L (ref 22–32)
Calcium: 7.8 mg/dL — ABNORMAL LOW (ref 8.9–10.3)
Creatinine, Ser: 1.95 mg/dL — ABNORMAL HIGH (ref 0.61–1.24)
GFR calc non Af Amer: 29 mL/min — ABNORMAL LOW (ref >60)
GFR, EST AFRICAN AMERICAN: 34 mL/min — AB (ref >60)
Glucose, Bld: 134 mg/dL — ABNORMAL HIGH (ref 65–99)
POTASSIUM: 3.9 mmol/L (ref 3.5–5.1)
SODIUM: 156 mmol/L — AB (ref 135–145)

## 2016-10-05 LAB — URINE CULTURE: CULTURE: NO GROWTH

## 2016-10-05 LAB — CBC WITH DIFFERENTIAL/PLATELET
BASOS ABS: 0 10*3/uL (ref 0.0–0.1)
BASOS PCT: 0 %
EOS ABS: 0 10*3/uL (ref 0.0–0.7)
Eosinophils Relative: 0 %
HEMATOCRIT: 35.6 % — AB (ref 39.0–52.0)
HEMOGLOBIN: 11.4 g/dL — AB (ref 13.0–17.0)
Lymphocytes Relative: 11 %
Lymphs Abs: 1.1 10*3/uL (ref 0.7–4.0)
MCH: 27.8 pg (ref 26.0–34.0)
MCHC: 32 g/dL (ref 30.0–36.0)
MCV: 86.8 fL (ref 78.0–100.0)
MONOS PCT: 6 %
Monocytes Absolute: 0.7 10*3/uL (ref 0.1–1.0)
NEUTROS ABS: 8.4 10*3/uL — AB (ref 1.7–7.7)
NEUTROS PCT: 83 %
Platelets: 341 10*3/uL (ref 150–400)
RBC: 4.1 MIL/uL — AB (ref 4.22–5.81)
RDW: 15.4 % (ref 11.5–15.5)
WBC: 10.2 10*3/uL (ref 4.0–10.5)

## 2016-10-05 LAB — PROCALCITONIN: Procalcitonin: 0.46 ng/mL

## 2016-10-05 LAB — MAGNESIUM: Magnesium: 2.5 mg/dL — ABNORMAL HIGH (ref 1.7–2.4)

## 2016-10-05 MED ORDER — ENSURE ENLIVE PO LIQD: 237.0000 mL | Freq: Two times a day (BID) | ORAL | Status: DC

## 2016-10-05 MED ORDER — KCL IN DEXTROSE-NACL 20-5-0.45 MEQ/L-%-% IV SOLN: INTRAVENOUS | Status: DC

## 2016-10-05 MED ORDER — DEXTROSE 5 % IV SOLN
INTRAVENOUS | Status: DC
  Administered 2016-10-05 – 2016-10-07 (×4): via INTRAVENOUS
  Administered 2016-10-07: 200 mL via INTRAVENOUS
  Administered 2016-10-07 – 2016-10-11 (×4): via INTRAVENOUS

## 2016-10-05 MED ORDER — DEXTROSE 5 % IV SOLN
1.0000 g | INTRAVENOUS | Status: DC
  Administered 2016-10-05 – 2016-10-06 (×2): 1 g via INTRAVENOUS
  Filled 2016-10-05 (×2): qty 1

## 2016-10-05 MED ORDER — VANCOMYCIN HCL IN DEXTROSE 1-5 GM/200ML-% IV SOLN
1000.0000 mg | INTRAVENOUS | Status: DC
  Administered 2016-10-05: 1000 mg via INTRAVENOUS
  Filled 2016-10-05: qty 200

## 2016-10-05 NOTE — Progress Notes (Signed)
Pharmacy Antibiotic Note  Ryan Cunningham is a 83 y.o. male admitted on 10/03/2016 with sepsis. Pharmacy consulted to adjust antibiotic regimen to Ceftazidime + Vancomycin today. The patient has an allergy to penicillins listed as a rash however has tolerated cephalosporins previously.  Abx D#3 for r/o sepsis. Afeb, WBC trending down quickly to wnl. Scr trending down nicely. Cx's are negative so far.  Plan: Increase ceftazidime to 1g IV Q24h Start vancomycin 1g IV Q48h tonight Monitor clinical picture, renal function, VT prn F/U C&S, abx deescalation / LOT  May be able to de-escalate abx soon  Height:  (188 cm) Weight: 186 lb 8.2 oz (84.6 kg) IBW/kg (Calculated) : 82.2  Temp (24hrs), Avg:97.9 F (36.6 C), Min:97.4 F (36.3 C), Max:98.4 F (36.9 C)   Recent Labs Lab 10/03/16 1908 10/03/16 1914 10/03/16 2257 10/04/16 0241 10/04/16 0242 10/04/16 1446 10/05/16 0532  WBC 22.4*  --   --   --  17.7*  --  10.2  CREATININE 8.04*  --   --   --  5.77* 3.87* 2.46*  LATICACIDVEN  --  2.47* 1.5 1.3  --   --   --     Estimated Creatinine Clearance: 24.6 mL/min (A) (by C-G formula based on SCr of 2.46 mg/dL (H)).    Allergies  Allergen Reactions  . Penicillins Rash    Has patient had a PCN reaction causing immediate rash, facial/tongue/throat swelling, SOB or lightheadedness with hypotension: Yes Has patient had a PCN reaction causing severe rash involving mucus membranes or skin necrosis: No Has patient had a PCN reaction that required hospitalization: No Has patient had a PCN reaction occurring within the last 10 years: No If all of the above answers are "NO", then may proceed with Cephalosporin use.     Thank you for allowing pharmacy to be a part of this patient's care.  Enzo Bi, PharmD, BCPS Clinical Pharmacist Pager [phone] 10/05/2016 10:22 AM

## 2016-10-05 NOTE — Progress Notes (Signed)
[facility] PROGRESS NOTE  KOY LAMP WJX:[id number] DOB: June 09, 1927 DOA: 10/03/2016 PCP: Jarome Matin, MD   HPI/Subjective: No issues overnight. Family feels patient is less responsive. Issues with sleep overnight. Family complains a patient has a cough now. Daughter admits to feeding patient by force at the bedside using a straw.  Objective: Vitals:   10/05/16 0531 10/05/16 0901  BP: (!) 141/55 (!) 141/54  Pulse: (!) 48 (!) 50  Resp: 15 18  Temp: 98.3 F (36.8 C) 98.4 F (36.9 C)  SpO2: 97% 97%    Intake/Output Summary (Last 24 hours) at 10/05/16 1735 Last data filed at 10/05/16 1107  Gross per 24 hour  Intake           1862.5 ml  Output             2150 ml  Net           -287.5 ml   Filed Weights   10/03/16 1909 10/04/16 0141  Weight: 84.8 kg (187 lb) 84.6 kg (186 lb 8.2 oz)    Exam:   General:  NAD, NCAT, GCS13  Cardiovascular: RRR, no MRG  Respiratory: CTAB, incro wob  Abdomen: ND, NTTP  Data Reviewed: Basic Metabolic Panel:  Recent Labs Lab 10/03/16 1908 10/04/16 0242 10/04/16 0822 10/04/16 1446 10/05/16 0532 10/05/16 1515  NA 142 146*  --  151* 155* 156*  K 5.5* 4.2  --  4.1 4.1 3.9  CL 108 113*  --  120* 127* 127*  CO2 19* 19*  --  21* 19* 22  GLUCOSE 143* 110*  --  110* 87 134*  BUN 208* 187*  --  158* 128* 114*  CREATININE 8.04* 5.77*  --  3.87* 2.46* 1.95*  CALCIUM 8.2* 7.6*  --  7.9* 7.9* 7.8*  MG  --   --  2.8*  --  2.5*  --   PHOS  --   --  6.9*  --  4.5  --    Liver Function Tests:  Recent Labs Lab 10/03/16 1908 10/05/16 0532  AST 41  --   ALT 6*  --   ALKPHOS 209*  --   BILITOT 0.9  --   PROT 7.6  --   ALBUMIN 2.4* 1.9*   No results for input(s): LIPASE, AMYLASE in the last 168 hours.  Recent Labs Lab 10/03/16 2257  AMMONIA 27   CBC:  Recent Labs Lab 10/03/16 1908 10/04/16 0242 10/05/16 0532  WBC 22.4* 17.7* 10.2  NEUTROABS 20.1*  --  8.4*  HGB 12.8* 10.8* 11.4*  HCT 40.0 32.8* 35.6*  MCV  85.5 84.5 86.8  PLT 310 258 341   Cardiac Enzymes:  Recent Labs Lab 10/03/16 1908  CKTOTAL 49   BNP (last 3 results)  Recent Labs  09/18/16 1909  BNP 651.8*    ProBNP (last 3 results) No results for input(s): PROBNP in the last 8760 hours.  CBG: No results for input(s): GLUCAP in the last 168 hours.  Recent Results (from the past 240 hour(s))  Blood Culture (routine x 2)     Status: None (Preliminary result)   Collection Time: 10/03/16  8:18 PM  Result Value Ref Range Status   Specimen Description BLOOD LEFT ANTECUBITAL  Final   Special Requests   Final    BOTTLES DRAWN AEROBIC AND ANAEROBIC Blood Culture adequate volume   Culture NO GROWTH 2 DAYS  Final   Report Status PENDING  Incomplete  Urine culture     Status:  None   Collection Time: 10/03/16  8:20 PM  Result Value Ref Range Status   Specimen Description URINE, RANDOM  Final   Special Requests NONE  Final   Culture NO GROWTH  Final   Report Status 10/05/2016 FINAL  Final  Blood Culture (routine x 2)     Status: None (Preliminary result)   Collection Time: 10/03/16  8:22 PM  Result Value Ref Range Status   Specimen Description BLOOD RIGHT HAND  Final   Special Requests   Final    BOTTLES DRAWN AEROBIC AND ANAEROBIC Blood Culture adequate volume   Culture NO GROWTH 2 DAYS  Final   Report Status PENDING  Incomplete  MRSA PCR Screening     Status: None   Collection Time: 10/04/16  2:46 AM  Result Value Ref Range Status   MRSA by PCR NEGATIVE NEGATIVE Final    Comment:        The GeneXpert MRSA Assay (FDA approved for NASAL specimens only), is one component of a comprehensive MRSA colonization surveillance program. It is not intended to diagnose MRSA infection nor to guide or monitor treatment for MRSA infections.      Studies: Ct Head Wo Contrast  Result Date: 10/03/2016 CLINICAL DATA:  Altered level of consciousness.  Syncope. EXAM: CT HEAD WITHOUT CONTRAST TECHNIQUE: Contiguous axial images were  obtained from the base of the skull through the vertex without intravenous contrast. COMPARISON:  Head CT 09/21/2016 FINDINGS: Brain: Stable degree of atrophy and chronic small vessel ischemia from prior. No intracranial hemorrhage, mass effect, or midline shift. No hydrocephalus. The basilar cisterns are patent. No evidence of territorial infarct or acute ischemia. No extra-axial or intracranial fluid collection. Vascular: Atherosclerosis of skullbase vasculature without hyperdense vessel or abnormal calcification. Skull: No fracture or focal lesion. Sinuses/Orbits: Paranasal sinuses and mastoid air cells are clear. The visualized orbits are unremarkable. Bilateral cataract section. Other: None. IMPRESSION: No acute intracranial abnormality. Electronically Signed   By: Rubye Oaks M.D.   On: 10/03/2016 22:51   US Renal  Result Date: 10/04/2016 CLINICAL DATA:  83 year old with acute renal failure. EXAM: RENAL / URINARY TRACT ULTRASOUND COMPLETE COMPARISON:  None. FINDINGS: Right Kidney: Length: 10.9 cm. Mild to moderate hydronephrosis. Mild thinning of the renal parenchyma. No focal renal mass. Left Kidney: Length: 1.8 cm. Mild to moderate hydronephrosis. Mild thinning of the renal parenchyma. No focal renal mass. Bladder: Decompressed by Foley catheter. IMPRESSION: Mild to moderate bilateral hydronephrosis. Urinary bladder is decompressed by Foley catheter. Electronically Signed   By: Rubye Oaks M.D.   On: 10/04/2016 06:35   Dg Chest Portable 1 View  Result Date: 10/03/2016 CLINICAL DATA:  AMS EXAM: PORTABLE CHEST 1 VIEW COMPARISON:  09/18/2016 FINDINGS: The heart is enlarged. There is pulmonary vascular congestion. No overt edema. There has been some improvement in aeration of the left lung base. Persistent atelectasis or resolving infiltrate remains. IMPRESSION: 1. Cardiomegaly and vascular congestion. 2. Improving aeration at the left lung base with persistent atelectasis or infiltrate.  Electronically Signed   By: Norva Pavlov M.D.   On: 10/03/2016 20:34   Ct Renal Stone Study  Result Date: 10/04/2016 CLINICAL DATA:  83 year old with acute renal failure and unexplained hematuria. Urinary tract ultrasound earlier today demonstrated bilateral hydronephrosis. EXAM: CT ABDOMEN AND PELVIS WITHOUT CONTRAST TECHNIQUE: Multidetector CT imaging of the abdomen and pelvis was performed following the standard protocol without IV contrast. COMPARISON:  Urinary tract ultrasound performed earlier today. No prior CT. FINDINGS: Because the patient  was unable to follow breathing instructions due to acute mental status changes, respiratory motion blurs many of the images of the upper abdomen. Lower chest: Airspace opacities in the dependent portions of both lower lobes, right greater than left. Heart moderately enlarged. Aortic annular calcification. Mild mitral annular calcification. No pericardial effusion. Very small right pleural effusion. Hepatobiliary: Normal unenhanced appearance of the liver. Calcified gallstones in the gallbladder. No CT evidence of acute cholecystitis. No biliary ductal dilation. Pancreas: Normal unenhanced appearance. Spleen: Calcified granulomas in the lower pole. No significant focal abnormality, allowing for unenhanced technique. Normal in size. Adrenals/Urinary Tract: Normal appearing adrenal glands. Bilateral hydronephrosis as noted on ultrasound earlier today. High attenuation material in the right renal collecting system. No urinary tract calculi on either side. Within the limits of the unenhanced technique, no renal parenchymal masses. Urinary bladder decompressed by Foley catheter, accounting for the gas bubble within the bladder. Diverticulum arising from the left superolateral bladder wall. Stomach/Bowel: Stomach decompressed and unremarkable. Normal-appearing small bowel. Entire colon relatively decompressed which likely accounts for the apparent wall thickening. Mobile  cecum present in the anterior mid abdomen. Appendix not visible, but no pericecal inflammation. Vascular/Lymphatic: Severe aortoiliofemoral atherosclerosis without evidence of aneurysm. Scattered calcified lymph nodes in the gastrohepatic ligament and porta hepatis. No pathologic lymphadenopathy. Reproductive: Marked prostate gland enlargement, particularly the median lobe. Prostate tissue very near the ureteral orifices on both sides of the urinary bladder. Normal seminal vesicles. Other: Benign appearing retroperitoneal cyst in the right upper quadrant, posterior to the liver and right kidney, measuring approximately 5.0 x 9.3 x 12.6 cm. Mild edema involving the subcutaneous tissues of the dependent portions of the pelvis. Musculoskeletal: Osseous demineralization. No acute osseous abnormality. Degenerative disc disease at L4-5 and L5-S1 with bridging anterior osteophyte at L5-S1. Facet degenerative changes diffusely throughout the lumbar spine. Severe multifactorial spinal stenosis at L3-4. IMPRESSION: 1. Bilateral hydronephrosis as noted on ultrasound earlier today. No urinary tract calculi and no visible renal parenchymal masses to explain hematuria. High attenuation material in the right renal collecting system likely represents blood (though infected urine and a transitional cell carcinoma are also in the differential diagnosis). 2. Bilateral lower lobe atelectasis and/or pneumonia. 3. Likely benign large retroperitoneal cyst in the right upper quadrant posterior to the liver in right kidney, measured above. 4. Cholelithiasis without CT evidence of acute cholecystitis. 5. Old granulomatous disease involving the spleen and upper abdominal lymph nodes. No pathologic lymphadenopathy. 6. Marked prostate gland enlargement. The prostatic tissue is very close to the ureteral orifices on both sides of the bladder and may account for the hydronephrosis. 7. Severe multifactorial spinal stenosis at L3-4. Aortic  Atherosclerosis (ICD10-170.0) Electronically Signed   By: Hulan Saas M.D.   On: 10/04/2016 13:49    Scheduled Meds: . atorvastatin  40 mg Oral q1800  . carbidopa-levodopa  2 tablet Oral TID WC  . enoxaparin (LOVENOX) injection  30 mg Subcutaneous Q24H  . feeding supplement (ENSURE ENLIVE)  237 mL Oral BID BM  . guaiFENesin  600 mg Oral BID  . tamsulosin  0.4 mg Oral Daily   Continuous Infusions: . cefTAZidime (FORTAZ)  IV Stopped (10/05/16 1254)  . dextrose 5 % and 0.45 % NaCl with KCl 20 mEq/L    . dextrose 100 mL/hr at 10/05/16 1056  . vancomycin      Principal Problem:   Acute renal failure (ARF) (HCC) Active Problems:   Parkinson disease (HCC)   Essential hypertension   Acute encephalopathy   Foot  ulcer (HCC)   Hyperkalemia   Sepsis (HCC)   Assessment/Plan:  . Acute renal failure:  Off diuretics. ARB contributing, no NSAIDs. However suspect this is actually from obstruction or infection. No previous history of obstruction.   -Maintain Foley, on d/c send home with leg back -Check urine lites, Still awaiting -Continue IV fluids and trend creatinine- now on D5 for hypernatremia -Follow intake and output, still making urine -Hold ARB, will never restart per nephrology -Obtain renal ultrasound, Shows hydronephrosis from attention   2. Possible sepsis, will rule out:  Suspected source unclear.  Patient meets criteria given tachypnea, leukocytosis, and evidence of organ dysfunction, although there are alternative explanations for his elevated lactic acid and leukocytosis. Poss CAP based on imgs. Ordered: IS, albuteril qid, albuterol prn, mucinex  -Sepsis bundle utilized:             -Blood and urine cultures drawn             -Fluid bolus given in ED, La wnl now             -Antibiotics: vancomycin/fortaz; cultures negative was going to de-escalate antibiotics but family induced aspiration, will continue overnight - de-escalate tomorrow             -Repeat  renal function and complete blood count in AM     3. Parkinson's:  -Continue Sinemet -Hold depakote  4. Acute encephalopathy:  No dementia at baseline, suspect some MCI.  Encephalopathy likely from uremia, AKI.   -Check depakote (low) and ammonia (wnl) levels -Check CT head (recent fall/head injury), neg  5. Hypertension:  -Hold ARB, do not restart on d/c  6. Foot wounds:  This appears baseline -Consult WOC  7. Hyperkalemia, resolved:  Problem #1 above. ECG stable. -IV fluids -Trend K     DVT prophylaxis: Lovenox, low dose  Code Status: DO NOT RESUSCITATE  Family Communication: Daughter and son in law at [place]  Disposition Plan: Anticipate IV fluids, trend Cr.  Empiric antibiotics for now, follow culture data.  Likely 4-5 days admission. Consults called: None  Admission status: INPATIENT    Haydee Salter  AMION. If 7PM-7AM, please contact night-coverage at [email or web address], password [email or web address] 10/05/2016, 5:35 PM  LOS: 2 days

## 2016-10-05 NOTE — Progress Notes (Signed)
Initial Nutrition Assessment  DOCUMENTATION CODES:   Severe malnutrition in context of chronic illness  INTERVENTION:  Ensure Enlive po BID, each supplement provides 350 kcal and 20 grams of protein  RD continue to monitor  NUTRITION DIAGNOSIS:   Malnutrition (severe) related to chronic illness (CHF/CKD/intermittent confusion) as evidenced by severe depletion of muscle mass, severe depletion of body fat.  GOAL:   Patient will meet greater than or equal to 90% of their needs  MONITOR:   PO intake, Supplement acceptance, Weight trends, I & O's  REASON FOR ASSESSMENT:   Low Braden    ASSESSMENT:   Pt with PMH of HTN, OSA, Afib, CKD, Type II DM, PVD, BPH, CHF, Parkinson's disease, multiple wounds presents with altered mental status found acute renal failure suspected to be r/t obstruction   Pt with AMS, unable to communicate or provide nutritional history. No family at bedside however in MD note pt's daughter reports a decline in PO intake.   Per chart review pt has consumed 0% of meals since admission. Per chart review pt seems to trended downwards in weight however no recent weight history available within past year.   Labs reviewed; Na 155, Magnesium 2.5, Albumin 1.9, Potassium and Phosphorus WNL Medications reviewed; 100 mL/hr D5  Nutrition focused physical exam completed. Findings include moderate to severe fat depletion, moderate to severe muscle depletion and no edema.  Diet Order:  Diet Heart Room service appropriate? Yes; Fluid consistency: Thin  Skin:   (Multiple wounds to feet, back, and buttocks, abrasion L hip )  Last BM:  10/04/16  Height:   Ht Readings from Last 1 Encounters:  10/04/16  (1.88 m)    Weight:   Wt Readings from Last 1 Encounters:  10/04/16 186 lb 8.2 oz (84.6 kg)    Ideal Body Weight:  86.4 kg  BMI:  Body mass index is 23.95 kg/m.  Estimated Nutritional Needs:   Kcal:  2100-2300  Protein:  105-115 grams  Fluid:  >/= 2  L/d  EDUCATION NEEDS:   Education needs no appropriate at this time  Fransisca Kaufmann, MS, RDN, LDN 10/05/2016 12:53 PM

## 2016-10-05 NOTE — Progress Notes (Signed)
Subjective:   Now only 600 of UOP ??  Not sure is right- foley busting- kidney numbers improving.... Getting hypernatremic Objective Vital signs in last 24 hours: Vitals:   10/04/16 1953 10/04/16 2136 10/05/16 0531 10/05/16 0901  BP:  (!) 146/50 (!) 141/55 (!) 141/54  Pulse:  (!) 54 (!) 48 (!) 50  Resp:  Temp:  (!) 97.4 F (36.3 C) 98.3 F (36.8 C) 98.4 F (36.9 C)  TempSrc:    Oral  SpO2: 98% 97% 97% 97%  Weight:      Height:       Weight change:   Intake/Output Summary (Last 24 hours) at 10/05/16 1027 Last data filed at 10/05/16 0600  Gross per 24 hour  Intake          3320.83 ml  Output              600 ml  Net          2720.83 ml    Assessment/Plan: 83 year old white male with acute kidney injury, clinical urinary obstruction and ARB use 1.Renal- acute kidney injury- I suspect the main component is of urinary obstruction. Renal ultrasound showed hydronephrosis. After Foley catheter placed significant amounts of urinary output and improvement of BUN and creatinine so that seems to have fixed the obstruction for now.  Would keep Foley in for now and will likely need urology input at some point for definitive treatment.   2. Hypertension/volume  - volume overload due to urinary obstruction. Polyuria initially , a little better.  I agree with IV fluids right now - but will change IVF to d5water for hypernatremia  I would never put this man on Cozaar again 3. Anemia  - situational, no treatment needed right now 4. Hypernatremia- change IVF to d5- check bmp later today     Ryan Cunningham A    Labs: Basic Metabolic Panel:  Recent Labs Lab 10/04/16 0242 10/04/16 0822 10/04/16 1446 10/05/16 0532  NA 146*  --  151* 155*  K 4.2  --  4.1 4.1  CL 113*  --  120* 127*  CO2 19*  --  21* 19*  GLUCOSE 110*  --  110* 87  BUN 187*  --  158* 128*  CREATININE 5.77*  --  3.87* 2.46*  CALCIUM 7.6*  --  7.9* 7.9*  PHOS  --  6.9*  --  4.5   Liver Function  Tests:  Recent Labs Lab 10/03/16 1908 10/05/16 0532  AST 41  --   ALT 6*  --   ALKPHOS 209*  --   BILITOT 0.9  --   PROT 7.6  --   ALBUMIN 2.4* 1.9*   No results for input(s): LIPASE, AMYLASE in the last 168 hours.  Recent Labs Lab 10/03/16 2257  AMMONIA 27   CBC:  Recent Labs Lab 10/03/16 1908 10/04/16 0242 10/05/16 0532  WBC 22.4* 17.7* 10.2  NEUTROABS 20.1*  --  8.4*  HGB 12.8* 10.8* 11.4*  HCT 40.0 32.8* 35.6*  MCV 85.5 84.5 86.8  PLT 310 258 341   Cardiac Enzymes:  Recent Labs Lab 10/03/16 1908  CKTOTAL 49   CBG: No results for input(s): GLUCAP in the last 168 hours.  Iron Studies: No results for input(s): IRON, TIBC, TRANSFERRIN, FERRITIN in the last 72 hours. Studies/Results: Ct Head Wo Contrast  Result Date: 10/03/2016 CLINICAL DATA:  Altered level of consciousness.  Syncope. EXAM: CT HEAD WITHOUT CONTRAST TECHNIQUE: Contiguous axial images were obtained from  the base of the skull through the vertex without intravenous contrast. COMPARISON:  Head CT 09/21/2016 FINDINGS: Brain: Stable degree of atrophy and chronic small vessel ischemia from prior. No intracranial hemorrhage, mass effect, or midline shift. No hydrocephalus. The basilar cisterns are patent. No evidence of territorial infarct or acute ischemia. No extra-axial or intracranial fluid collection. Vascular: Atherosclerosis of skullbase vasculature without hyperdense vessel or abnormal calcification. Skull: No fracture or focal lesion. Sinuses/Orbits: Paranasal sinuses and mastoid air cells are clear. The visualized orbits are unremarkable. Bilateral cataract section. Other: None. IMPRESSION: No acute intracranial abnormality. Electronically Signed   By: Rubye Oaks M.D.   On: 10/03/2016 22:51   US Renal  Result Date: 10/04/2016 CLINICAL DATA:  83 year old with acute renal failure. EXAM: RENAL / URINARY TRACT ULTRASOUND COMPLETE COMPARISON:  None. FINDINGS: Right Kidney: Length: 10.9 cm. Mild to  moderate hydronephrosis. Mild thinning of the renal parenchyma. No focal renal mass. Left Kidney: Length: 1.8 cm. Mild to moderate hydronephrosis. Mild thinning of the renal parenchyma. No focal renal mass. Bladder: Decompressed by Foley catheter. IMPRESSION: Mild to moderate bilateral hydronephrosis. Urinary bladder is decompressed by Foley catheter. Electronically Signed   By: Rubye Oaks M.D.   On: 10/04/2016 06:35   Dg Chest Portable 1 View  Result Date: 10/03/2016 CLINICAL DATA:  AMS EXAM: PORTABLE CHEST 1 VIEW COMPARISON:  09/18/2016 FINDINGS: The heart is enlarged. There is pulmonary vascular congestion. No overt edema. There has been some improvement in aeration of the left lung base. Persistent atelectasis or resolving infiltrate remains. IMPRESSION: 1. Cardiomegaly and vascular congestion. 2. Improving aeration at the left lung base with persistent atelectasis or infiltrate. Electronically Signed   By: Norva Pavlov M.D.   On: 10/03/2016 20:34   Ct Renal Stone Study  Result Date: 10/04/2016 CLINICAL DATA:  83 year old with acute renal failure and unexplained hematuria. Urinary tract ultrasound earlier today demonstrated bilateral hydronephrosis. EXAM: CT ABDOMEN AND PELVIS WITHOUT CONTRAST TECHNIQUE: Multidetector CT imaging of the abdomen and pelvis was performed following the standard protocol without IV contrast. COMPARISON:  Urinary tract ultrasound performed earlier today. No prior CT. FINDINGS: Because the patient was unable to follow breathing instructions due to acute mental status changes, respiratory motion blurs many of the images of the upper abdomen. Lower chest: Airspace opacities in the dependent portions of both lower lobes, right greater than left. Heart moderately enlarged. Aortic annular calcification. Mild mitral annular calcification. No pericardial effusion. Very small right pleural effusion. Hepatobiliary: Normal unenhanced appearance of the liver. Calcified  gallstones in the gallbladder. No CT evidence of acute cholecystitis. No biliary ductal dilation. Pancreas: Normal unenhanced appearance. Spleen: Calcified granulomas in the lower pole. No significant focal abnormality, allowing for unenhanced technique. Normal in size. Adrenals/Urinary Tract: Normal appearing adrenal glands. Bilateral hydronephrosis as noted on ultrasound earlier today. High attenuation material in the right renal collecting system. No urinary tract calculi on either side. Within the limits of the unenhanced technique, no renal parenchymal masses. Urinary bladder decompressed by Foley catheter, accounting for the gas bubble within the bladder. Diverticulum arising from the left superolateral bladder wall. Stomach/Bowel: Stomach decompressed and unremarkable. Normal-appearing small bowel. Entire colon relatively decompressed which likely accounts for the apparent wall thickening. Mobile cecum present in the anterior mid abdomen. Appendix not visible, but no pericecal inflammation. Vascular/Lymphatic: Severe aortoiliofemoral atherosclerosis without evidence of aneurysm. Scattered calcified lymph nodes in the gastrohepatic ligament and porta hepatis. No pathologic lymphadenopathy. Reproductive: Marked prostate gland enlargement, particularly the median lobe. Prostate tissue  very near the ureteral orifices on both sides of the urinary bladder. Normal seminal vesicles. Other: Benign appearing retroperitoneal cyst in the right upper quadrant, posterior to the liver and right kidney, measuring approximately 5.0 x 9.3 x 12.6 cm. Mild edema involving the subcutaneous tissues of the dependent portions of the pelvis. Musculoskeletal: Osseous demineralization. No acute osseous abnormality. Degenerative disc disease at L4-5 and L5-S1 with bridging anterior osteophyte at L5-S1. Facet degenerative changes diffusely throughout the lumbar spine. Severe multifactorial spinal stenosis at L3-4. IMPRESSION: 1.  Bilateral hydronephrosis as noted on ultrasound earlier today. No urinary tract calculi and no visible renal parenchymal masses to explain hematuria. High attenuation material in the right renal collecting system likely represents blood (though infected urine and a transitional cell carcinoma are also in the differential diagnosis). 2. Bilateral lower lobe atelectasis and/or pneumonia. 3. Likely benign large retroperitoneal cyst in the right upper quadrant posterior to the liver in right kidney, measured above. 4. Cholelithiasis without CT evidence of acute cholecystitis. 5. Old granulomatous disease involving the spleen and upper abdominal lymph nodes. No pathologic lymphadenopathy. 6. Marked prostate gland enlargement. The prostatic tissue is very close to the ureteral orifices on both sides of the bladder and may account for the hydronephrosis. 7. Severe multifactorial spinal stenosis at L3-4. Aortic Atherosclerosis (ICD10-170.0) Electronically Signed   By: Hulan Saas M.D.   On: 10/04/2016 13:49   Medications: Infusions: . sodium chloride 125 mL/hr at 10/05/16 0011  . cefTAZidime (FORTAZ)  IV    . vancomycin      Scheduled Medications: . atorvastatin  40 mg Oral q1800  . carbidopa-levodopa  2 tablet Oral TID WC  . enoxaparin (LOVENOX) injection  30 mg Subcutaneous Q24H  . guaiFENesin  600 mg Oral BID  . tamsulosin  0.4 mg Oral Daily    have reviewed scheduled and prn medications.  Physical Exam: General: a little more alert Heart:RRR Lungs: mostly clear Abdomen: soft, non tender Extremities: pitting edema     10/05/2016,10:27 AM  LOS: 2 days

## 2016-10-06 ENCOUNTER — Inpatient Hospital Stay (HOSPITAL_COMMUNITY): Payer: Medicare Other

## 2016-10-06 DIAGNOSIS — G9340 Encephalopathy, unspecified: Secondary | ICD-10-CM

## 2016-10-06 LAB — RENAL FUNCTION PANEL
ALBUMIN: 1.8 g/dL — AB (ref 3.5–5.0)
ANION GAP: 5 (ref 5–15)
ANION GAP: 6 (ref 5–15)
Albumin: 1.9 g/dL — ABNORMAL LOW (ref 3.5–5.0)
BUN: 96 mg/dL — AB (ref 6–20)
BUN: 74 mg/dL — ABNORMAL HIGH (ref 6–20)
CHLORIDE: 129 mmol/L — AB (ref 101–111)
CHLORIDE: 127 mmol/L — AB (ref 101–111)
CO2: 24 mmol/L (ref 22–32)
CO2: 20 mmol/L — AB (ref 22–32)
CREATININE: 1.41 mg/dL — AB (ref 0.61–1.24)
Calcium: 7.9 mg/dL — ABNORMAL LOW (ref 8.9–10.3)
Calcium: 7.9 mg/dL — ABNORMAL LOW (ref 8.9–10.3)
Creatinine, Ser: 1.61 mg/dL — ABNORMAL HIGH (ref 0.61–1.24)
GFR calc Af Amer: 43 mL/min — ABNORMAL LOW (ref >60)
GFR, EST AFRICAN AMERICAN: 50 mL/min — AB (ref >60)
GFR, EST NON AFRICAN AMERICAN: 37 mL/min — AB (ref >60)
GFR, EST NON AFRICAN AMERICAN: 43 mL/min — AB (ref >60)
Glucose, Bld: 142 mg/dL — ABNORMAL HIGH (ref 65–99)
Glucose, Bld: 172 mg/dL — ABNORMAL HIGH (ref 65–99)
PHOSPHORUS: 2.8 mg/dL (ref 2.5–4.6)
POTASSIUM: 4.2 mmol/L (ref 3.5–5.1)
POTASSIUM: 4 mmol/L (ref 3.5–5.1)
Phosphorus: 2.4 mg/dL — ABNORMAL LOW (ref 2.5–4.6)
Sodium: 158 mmol/L — ABNORMAL HIGH (ref 135–145)
Sodium: 153 mmol/L — ABNORMAL HIGH (ref 135–145)

## 2016-10-06 LAB — HEPATIC FUNCTION PANEL
ALBUMIN: 1.9 g/dL — AB (ref 3.5–5.0)
ALK PHOS: 155 U/L — AB (ref 38–126)
ALT: 25 U/L (ref 17–63)
AST: 37 U/L (ref 15–41)
BILIRUBIN TOTAL: 1.4 mg/dL — AB (ref 0.3–1.2)
Bilirubin, Direct: 0.4 mg/dL (ref 0.1–0.5)
Indirect Bilirubin: 1 mg/dL — ABNORMAL HIGH (ref 0.3–0.9)
TOTAL PROTEIN: 6.3 g/dL — AB (ref 6.5–8.1)

## 2016-10-06 LAB — CBC WITH DIFFERENTIAL/PLATELET
BASOS ABS: 0 10*3/uL (ref 0.0–0.1)
Basophils Relative: 0 %
EOS PCT: 0 %
Eosinophils Absolute: 0 10*3/uL (ref 0.0–0.7)
HEMATOCRIT: 34.6 % — AB (ref 39.0–52.0)
HEMOGLOBIN: 10.5 g/dL — AB (ref 13.0–17.0)
LYMPHS ABS: 1.3 10*3/uL (ref 0.7–4.0)
Lymphocytes Relative: 12 %
MCH: 27.1 pg (ref 26.0–34.0)
MCHC: 30.3 g/dL (ref 30.0–36.0)
MCV: 89.4 fL (ref 78.0–100.0)
Monocytes Absolute: 0.9 10*3/uL (ref 0.1–1.0)
Monocytes Relative: 8 %
NEUTROS ABS: 8.8 10*3/uL — AB (ref 1.7–7.7)
Neutrophils Relative %: 80 %
Platelets: 313 10*3/uL (ref 150–400)
RBC: 3.87 MIL/uL — AB (ref 4.22–5.81)
RDW: 15.6 % — ABNORMAL HIGH (ref 11.5–15.5)
WBC: 11 10*3/uL — AB (ref 4.0–10.5)

## 2016-10-06 LAB — BASIC METABOLIC PANEL
Anion gap: 3 — ABNORMAL LOW (ref 5–15)
BUN: 68 mg/dL — ABNORMAL HIGH (ref 6–20)
CHLORIDE: 126 mmol/L — AB (ref 101–111)
CO2: 24 mmol/L (ref 22–32)
Calcium: 7.8 mg/dL — ABNORMAL LOW (ref 8.9–10.3)
Creatinine, Ser: 1.35 mg/dL — ABNORMAL HIGH (ref 0.61–1.24)
GFR calc Af Amer: 53 mL/min — ABNORMAL LOW (ref >60)
GFR calc non Af Amer: 46 mL/min — ABNORMAL LOW (ref >60)
Glucose, Bld: 147 mg/dL — ABNORMAL HIGH (ref 65–99)
POTASSIUM: 4.3 mmol/L (ref 3.5–5.1)
SODIUM: 153 mmol/L — AB (ref 135–145)

## 2016-10-06 MED ORDER — CHLORHEXIDINE GLUCONATE 0.12 % MT SOLN
15.0000 mL | Freq: Two times a day (BID) | OROMUCOSAL | Status: DC
  Administered 2016-10-06 – 2016-10-18 (×19): 15 mL via OROMUCOSAL
  Filled 2016-10-06 (×17): qty 15

## 2016-10-06 MED ORDER — VANCOMYCIN HCL IN DEXTROSE 1-5 GM/200ML-% IV SOLN
1000.0000 mg | INTRAVENOUS | Status: DC
  Administered 2016-10-07: 1000 mg via INTRAVENOUS
  Filled 2016-10-06 (×2): qty 200

## 2016-10-06 MED ORDER — DEXTROSE 5 % IV SOLN: INTRAVENOUS | Status: DC

## 2016-10-06 MED ORDER — ORAL CARE MOUTH RINSE
15.0000 mL | Freq: Two times a day (BID) | OROMUCOSAL | Status: DC
  Administered 2016-10-07 – 2016-10-19 (×17): 15 mL via OROMUCOSAL

## 2016-10-06 MED ORDER — DEXTROSE 5 % IV SOLN
1.0000 g | Freq: Two times a day (BID) | INTRAVENOUS | Status: DC
  Administered 2016-10-06 – 2016-10-07 (×2): 1 g via INTRAVENOUS
  Filled 2016-10-06 (×3): qty 1

## 2016-10-06 MED ORDER — ENOXAPARIN SODIUM 40 MG/0.4ML ~~LOC~~ SOLN
40.0000 mg | SUBCUTANEOUS | Status: DC
  Administered 2016-10-07 – 2016-10-11 (×5): 40 mg via SUBCUTANEOUS
  Filled 2016-10-06 (×5): qty 0.4

## 2016-10-06 NOTE — Progress Notes (Signed)
From report, night shift nurse stated that patient was not able to take PO medications D/T patients decreased alertness. MD notified of this information. Patient is currently NPO. Orders followed. Will continue to monitor.

## 2016-10-06 NOTE — Progress Notes (Signed)
Subjective:   2600 of UOP  - kidney numbers improving.... Getting more hypernatremic in spite of d5w at 100 per hour Objective Vital signs in last 24 hours: Vitals:   10/05/16 2045 10/06/16 0419 10/06/16 0700 10/06/16 0918  BP: (!) 171/80 (!) 160/134 (!) 132/49 (!) 169/58  Pulse: (!) 42 (!) 52  (!) 46  Resp: Temp: 97.9 F (36.6 C) 98.4 F (36.9 C)  98.5 F (36.9 C)  TempSrc: Axillary Axillary  Axillary  SpO2: 97%   97%  Weight:      Height:       Weight change:   Intake/Output Summary (Last 24 hours) at 10/06/16 1210 Last data filed at 10/06/16 1128  Gross per 24 hour  Intake          2453.33 ml  Output             1450 ml  Net          1003.33 ml    Assessment/Plan: 83 year old white male with acute kidney injury, clinical urinary obstruction and ARB use 1.Renal- acute kidney injury- I suspect the main component is of urinary obstruction. Renal ultrasound showed hydronephrosis. After Foley catheter placed significant amounts of urinary output and improvement of BUN and creatinine so that seems to have fixed the obstruction for now.  Would keep Foley in for now and will likely need urology input at some point for definitive treatment.   2. Hypertension/volume  - volume overload due to urinary obstruction. Polyuria initially , a little better.  I agree with IV fluids right now -  changed IVF to d5water for hypernatremia  I would never put this man on Cozaar again 3. Anemia  - situational, no treatment needed right now 4. Hypernatremia- change IVF to d5- check bmp later today.  Labs worse in spite of giving d5W-  Will inc to 200 per hour.  Might be helpful to place NGT to give not only TF but free water     Arzu Mcgaughey A    Labs: Basic Metabolic Panel:  Recent Labs Lab 10/04/16 0822  10/05/16 0532 10/05/16 1515 10/06/16 0242  NA  --   < > 155* 156* 158*  K  --   < > 4.1 3.9 4.2  CL  --   < > 127* 127* 129*  CO2  --   < > 19* 22 24  GLUCOSE  --   <  > 87 134* 142*  BUN  --   < > 128* 114* 96*  CREATININE  --   < > 2.46* 1.95* 1.61*  CALCIUM  --   < > 7.9* 7.8* 7.9*  PHOS 6.9*  --  4.5  --  2.8  < > = values in this interval not displayed. Liver Function Tests:  Recent Labs Lab 10/03/16 1908 10/05/16 0532 10/06/16 0242  AST 41  --   --   ALT 6*  --   --   ALKPHOS 209*  --   --   BILITOT 0.9  --   --   PROT 7.6  --   --   ALBUMIN 2.4* 1.9* 1.8*   No results for input(s): LIPASE, AMYLASE in the last 168 hours.  Recent Labs Lab 10/03/16 2257  AMMONIA 27   CBC:  Recent Labs Lab 10/03/16 1908 10/04/16 0242 10/05/16 0532 10/06/16 0242  WBC 22.4* 17.7* 10.2 11.0*  NEUTROABS 20.1*  --  8.4* 8.8*  HGB 12.8* 10.8* 11.4* 10.5*  HCT 40.0 32.8* 35.6* 34.6*  MCV 85.5 84.5 86.8 89.4  PLT 310 258 341 313   Cardiac Enzymes:  Recent Labs Lab 10/03/16 1908  CKTOTAL 49   CBG: No results for input(s): GLUCAP in the last 168 hours.  Iron Studies: No results for input(s): IRON, TIBC, TRANSFERRIN, FERRITIN in the last 72 hours. Studies/Results: Dg Chest Port 1 View  Result Date: 10/05/2016 CLINICAL DATA:  Cough. EXAM: PORTABLE CHEST 1 VIEW COMPARISON:  10/03/2016 and 09/18/2016 FINDINGS: Lungs are adequately inflated with persistent left base opacification with slight interval improvement. No definite effusion. Mild stable cardiomegaly. Prominence of the main pulmonary artery segment. Mild calcified plaque over the thoracic aorta. Degenerative change of the spine. IMPRESSION: Persistent left base opacification with mild interval improvement likely resolving infection. Stable cardiomegaly. Aortic Atherosclerosis (ICD10-I70.0). Electronically Signed   By: Elberta Fortis M.D.   On: 10/05/2016 18:26   Ct Renal Stone Study  Result Date: 10/04/2016 CLINICAL DATA:  83 year old with acute renal failure and unexplained hematuria. Urinary tract ultrasound earlier today demonstrated bilateral hydronephrosis. EXAM: CT ABDOMEN AND PELVIS  WITHOUT CONTRAST TECHNIQUE: Multidetector CT imaging of the abdomen and pelvis was performed following the standard protocol without IV contrast. COMPARISON:  Urinary tract ultrasound performed earlier today. No prior CT. FINDINGS: Because the patient was unable to follow breathing instructions due to acute mental status changes, respiratory motion blurs many of the images of the upper abdomen. Lower chest: Airspace opacities in the dependent portions of both lower lobes, right greater than left. Heart moderately enlarged. Aortic annular calcification. Mild mitral annular calcification. No pericardial effusion. Very small right pleural effusion. Hepatobiliary: Normal unenhanced appearance of the liver. Calcified gallstones in the gallbladder. No CT evidence of acute cholecystitis. No biliary ductal dilation. Pancreas: Normal unenhanced appearance. Spleen: Calcified granulomas in the lower pole. No significant focal abnormality, allowing for unenhanced technique. Normal in size. Adrenals/Urinary Tract: Normal appearing adrenal glands. Bilateral hydronephrosis as noted on ultrasound earlier today. High attenuation material in the right renal collecting system. No urinary tract calculi on either side. Within the limits of the unenhanced technique, no renal parenchymal masses. Urinary bladder decompressed by Foley catheter, accounting for the gas bubble within the bladder. Diverticulum arising from the left superolateral bladder wall. Stomach/Bowel: Stomach decompressed and unremarkable. Normal-appearing small bowel. Entire colon relatively decompressed which likely accounts for the apparent wall thickening. Mobile cecum present in the anterior mid abdomen. Appendix not visible, but no pericecal inflammation. Vascular/Lymphatic: Severe aortoiliofemoral atherosclerosis without evidence of aneurysm. Scattered calcified lymph nodes in the gastrohepatic ligament and porta hepatis. No pathologic lymphadenopathy.  Reproductive: Marked prostate gland enlargement, particularly the median lobe. Prostate tissue very near the ureteral orifices on both sides of the urinary bladder. Normal seminal vesicles. Other: Benign appearing retroperitoneal cyst in the right upper quadrant, posterior to the liver and right kidney, measuring approximately 5.0 x 9.3 x 12.6 cm. Mild edema involving the subcutaneous tissues of the dependent portions of the pelvis. Musculoskeletal: Osseous demineralization. No acute osseous abnormality. Degenerative disc disease at L4-5 and L5-S1 with bridging anterior osteophyte at L5-S1. Facet degenerative changes diffusely throughout the lumbar spine. Severe multifactorial spinal stenosis at L3-4. IMPRESSION: 1. Bilateral hydronephrosis as noted on ultrasound earlier today. No urinary tract calculi and no visible renal parenchymal masses to explain hematuria. High attenuation material in the right renal collecting system likely represents blood (though infected urine and a transitional cell carcinoma are also in the differential diagnosis). 2. Bilateral lower lobe atelectasis and/or pneumonia. 3.  Likely benign large retroperitoneal cyst in the right upper quadrant posterior to the liver in right kidney, measured above. 4. Cholelithiasis without CT evidence of acute cholecystitis. 5. Old granulomatous disease involving the spleen and upper abdominal lymph nodes. No pathologic lymphadenopathy. 6. Marked prostate gland enlargement. The prostatic tissue is very close to the ureteral orifices on both sides of the bladder and may account for the hydronephrosis. 7. Severe multifactorial spinal stenosis at L3-4. Aortic Atherosclerosis (ICD10-170.0) Electronically Signed   By: Hulan Saas M.D.   On: 10/04/2016 13:49   Medications: Infusions: . cefTAZidime (FORTAZ)  IV 1 g (10/06/16 1130)  . dextrose 100 mL/hr at 10/06/16 1129  . vancomycin 1,000 mg (10/05/16 2236)    Scheduled Medications: . atorvastatin   40 mg Oral q1800  . carbidopa-levodopa  2 tablet Oral TID WC  . enoxaparin (LOVENOX) injection  30 mg Subcutaneous Q24H  . feeding supplement (ENSURE ENLIVE)  237 mL Oral BID BM  . guaiFENesin  600 mg Oral BID  . tamsulosin  0.4 mg Oral Daily    have reviewed scheduled and prn medications.  Physical Exam: General: unresponsive  Heart:RRR Lungs: mostly clear Abdomen: soft, non tender Extremities: pitting edema     10/06/2016,12:10 PM  LOS: 3 days

## 2016-10-06 NOTE — Progress Notes (Signed)
[facility] PROGRESS NOTE  Ryan Cunningham RUE:[id number] DOB: [birth year] DOA: 10/03/2016 PCP: Jarome Matin, MD  Brief narrative: Patient admitted with acute kidney injury due to urinary obstruction which improved with Foley placement. Waxing and waning alertness blamed on infection. Cultures neg and was going to de-escalate antibiotics but then daughter caused inadvertent aspiration by feeding patient against his will with a straw.  HPI/Subjective: No issues overnight. Pt states he is tired.  Objective: Vitals:   10/06/16 0918 10/06/16 1717  BP: (!) 169/58 (!) 166/61  Pulse: (!) 46 (!) 40  Resp: 19 17  Temp: 98.5 F (36.9 C) (!) 97.5 F (36.4 C)  SpO2: 97% 100%    Intake/Output Summary (Last 24 hours) at 10/06/16 1739 Last data filed at 10/06/16 1729  Gross per 24 hour  Intake          3538.33 ml  Output             1825 ml  Net          1713.33 ml   Filed Weights   10/03/16 1909 10/04/16 0141  Weight: 84.8 kg (187 lb) 84.6 kg (186 lb 8.2 oz)    Exam:   General:  NAD, NCAT, GCS15  Cardiovascular: RRR, no MRG  Respiratory: CTAB, incro wob  Abdomen: ND, NTTP  Data Reviewed: Basic Metabolic Panel:  Recent Labs Lab 10/04/16 0822 10/04/16 1446 10/05/16 0532 10/05/16 1515 10/06/16 0242 10/06/16 1605  NA  --  151* 155* 156* 158* 153*  K  --  4.1 4.1 3.9 4.2 4.0  CL  --  120* 127* 127* 129* 127*  CO2  --  21* 19* 22 24 20*  GLUCOSE  --  110* 87 134* 142* 172*  BUN  --  158* 128* 114* 96* 74*  CREATININE  --  3.87* 2.46* 1.95* 1.61* 1.41*  CALCIUM  --  7.9* 7.9* 7.8* 7.9* 7.9*  MG 2.8*  --  2.5*  --   --   --   PHOS 6.9*  --  4.5  --  2.8 2.4*   Liver Function Tests:  Recent Labs Lab 10/03/16 1908 10/05/16 0532 10/06/16 0242 10/06/16 1605  AST 41  --   --   --   ALT 6*  --   --   --   ALKPHOS 209*  --   --   --   BILITOT 0.9  --   --   --   PROT 7.6  --   --   --   ALBUMIN 2.4* 1.9* 1.8* 1.9*   No results for input(s): LIPASE, AMYLASE  in the last 168 hours.  Recent Labs Lab 10/03/16 2257  AMMONIA 27   CBC:  Recent Labs Lab 10/03/16 1908 10/04/16 0242 10/05/16 0532 10/06/16 0242  WBC 22.4* 17.7* 10.2 11.0*  NEUTROABS 20.1*  --  8.4* 8.8*  HGB 12.8* 10.8* 11.4* 10.5*  HCT 40.0 32.8* 35.6* 34.6*  MCV 85.5 84.5 86.8 89.4  PLT 310 258 341 313   Cardiac Enzymes:  Recent Labs Lab 10/03/16 1908  CKTOTAL 49   BNP (last 3 results)  Recent Labs  09/18/16 1909  BNP 651.8*    ProBNP (last 3 results) No results for input(s): PROBNP in the last 8760 hours.  CBG: No results for input(s): GLUCAP in the last 168 hours.  Recent Results (from the past 240 hour(s))  Blood Culture (routine x 2)     Status: None (Preliminary result)   Collection Time: 10/03/16  8:18 PM  Result Value Ref Range Status   Specimen Description BLOOD LEFT ANTECUBITAL  Final   Special Requests   Final    BOTTLES DRAWN AEROBIC AND ANAEROBIC Blood Culture adequate volume   Culture NO GROWTH 3 DAYS  Final   Report Status PENDING  Incomplete  Urine culture     Status: None   Collection Time: 10/03/16  8:20 PM  Result Value Ref Range Status   Specimen Description URINE, RANDOM  Final   Special Requests NONE  Final   Culture NO GROWTH  Final   Report Status 10/05/2016 FINAL  Final  Blood Culture (routine x 2)     Status: None (Preliminary result)   Collection Time: 10/03/16  8:22 PM  Result Value Ref Range Status   Specimen Description BLOOD RIGHT HAND  Final   Special Requests   Final    BOTTLES DRAWN AEROBIC AND ANAEROBIC Blood Culture adequate volume   Culture NO GROWTH 3 DAYS  Final   Report Status PENDING  Incomplete  MRSA PCR Screening     Status: None   Collection Time: 10/04/16  2:46 AM  Result Value Ref Range Status   MRSA by PCR NEGATIVE NEGATIVE Final    Comment:        The GeneXpert MRSA Assay (FDA approved for NASAL specimens only), is one component of a comprehensive MRSA colonization surveillance program. It  is not intended to diagnose MRSA infection nor to guide or monitor treatment for MRSA infections.      Studies: Dg Chest Port 1 View  Result Date: 10/05/2016 CLINICAL DATA:  Cough. EXAM: PORTABLE CHEST 1 VIEW COMPARISON:  10/03/2016 and 09/18/2016 FINDINGS: Lungs are adequately inflated with persistent left base opacification with slight interval improvement. No definite effusion. Mild stable cardiomegaly. Prominence of the main pulmonary artery segment. Mild calcified plaque over the thoracic aorta. Degenerative change of the spine. IMPRESSION: Persistent left base opacification with mild interval improvement likely resolving infection. Stable cardiomegaly. Aortic Atherosclerosis (ICD10-I70.0). Electronically Signed   By: Elberta Fortis M.D.   On: 10/05/2016 18:26    Scheduled Meds: . atorvastatin  40 mg Oral q1800  . carbidopa-levodopa  2 tablet Oral TID WC  . [START ON 10/07/2016] enoxaparin (LOVENOX) injection  40 mg Subcutaneous Q24H  . feeding supplement (ENSURE ENLIVE)  237 mL Oral BID BM  . guaiFENesin  600 mg Oral BID  . tamsulosin  0.4 mg Oral Daily   Continuous Infusions: . cefTAZidime (FORTAZ)  IV Stopped (10/06/16 1238)  . dextrose 200 mL/hr at 10/06/16 1239  . vancomycin 1,000 mg (10/05/16 2236)    Principal Problem:   Acute renal failure (ARF) (HCC) Active Problems:   Parkinson disease (HCC)   Essential hypertension   Acute encephalopathy   Foot ulcer (HCC)   Hyperkalemia   Sepsis (HCC)   Assessment/Plan:  . Acute renal failure:  Off diuretics. ARB contributing, no NSAIDs. However suspect this is actually from obstruction or infection. No previous history of obstruction.   -Maintain Foley, on d/c send home with leg back -Check urine lites, Still awaiting -Continue IV fluids and trend creatinine- now on D5 for hypernatremia -Follow intake and output, still making urine -Hold ARB, will never restart per nephrology -Obtain renal ultrasound, Shows  hydronephrosis from obstructions   2. Possible sepsis, will rule out:  Suspected source unclear.  Patient meets criteria given tachypnea, leukocytosis, and evidence of organ dysfunction, although there are alternative explanations for his elevated lactic acid and leukocytosis. Poss  CAP based on imgs. Ordered: IS, albuteril qid, albuterol prn, mucinex  -Sepsis bundle utilized:             -Blood and urine cultures drawn             -Fluid bolus given in ED, La wnl now             -Antibiotics: vancomycin/fortaz; cultures negative was going to de-escalate antibiotics but family induced aspiration, will continue overnight - de-escalate tomorrow             -Repeat renal function and complete blood count in AM     3. Parkinson's:  -Continue Sinemet -Hold depakote  4. Acute encephalopathy:  No dementia at baseline, suspect some MCI.  Encephalopathy likely from uremia, AKI.   -Check depakote (low) and ammonia (wnl) levels -Check CT head (recent fall/head injury), neg - will address hypernatremia with free water; D5 incr by neprho, getting NGT  5. Hypertension:  -Hold ARB, do not restart on d/c  6. Foot wounds:  This appears baseline -Consult WOC  7. Hyperkalemia, resolved:  Problem #1 above. ECG stable. -IV fluids -Trend K     DVT prophylaxis: Lovenox, low dose  Code Status: DO NOT RESUSCITATE  Family Communication: Daughter and son in law at [place]  Disposition Plan: Anticipate IV fluids, trend Cr.  Empiric antibiotics for now, follow culture data.  Likely 4-5 days admission. Consults called: None  Admission status: INPATIENT    Haydee Salter  AMION. If 7PM-7AM, please contact night-coverage at [email or web address], password [facility] 10/06/2016, 5:39 PM  LOS: 3 days

## 2016-10-06 NOTE — Progress Notes (Signed)
Pharmacy Antibiotic Note  Ryan Cunningham is a 83 y.o. male admitted on 10/03/2016 with sepsis. Pharmacy consulted to adjust antibiotic regimen to Ceftazidime + Vancomycin today. The patient has an allergy to penicillins listed as a rash however has tolerated cephalosporins previously.  Abx D#4 for r/o sepsis. Afeb, WBC trending down quickly to wnl. Scr continues to trend down, SCr down to 1.41, CrCl ~37 ml/min.  trending down nicely. Cx's are negative so far. UOP 0.9 ml/mim. I will adjust vancomycin and ceftazidime dose for improved renal function.   9/25 BCx: ngtd 9/25 UCx: negative 9/26 MRSA PCR: negative   Plan: Increase ceftazidime to 1g IV Q12h Increaset vancomycin 1g IV Q24h Monitor clinical picture, renal function, VT prn F/U C&S, abx deescalation / LOT  May be able to de-escalate abx soon  Height:  (188 cm) Weight: 186 lb 8.2 oz (84.6 kg) IBW/kg (Calculated) : 82.2  Temp (24hrs), Avg:98.1 F (36.7 C), Min:97.5 F (36.4 C), Max:98.5 F (36.9 C)   Recent Labs Lab 10/03/16 1908 10/03/16 1914 10/03/16 2257 10/04/16 0241 10/04/16 0242 10/04/16 1446 10/05/16 0532 10/05/16 1515 10/06/16 0242 10/06/16 1605  WBC 22.4*  --   --   --  17.7*  --  10.2  --  11.0*  --   CREATININE 8.04*  --   --   --  5.77* 3.87* 2.46* 1.95* 1.61* 1.41*  LATICACIDVEN  --  2.47* 1.5 1.3  --   --   --   --   --   --     Estimated Creatinine Clearance: 42.9 mL/min (A) (by C-G formula based on SCr of 1.41 mg/dL (H)).    Allergies  Allergen Reactions  . Penicillins Rash    Has patient had a PCN reaction causing immediate rash, facial/tongue/throat swelling, SOB or lightheadedness with hypotension: Yes Has patient had a PCN reaction causing severe rash involving mucus membranes or skin necrosis: No Has patient had a PCN reaction that required hospitalization: No Has patient had a PCN reaction occurring within the last 10 years: No If all of the above answers are "NO", then may proceed  with Cephalosporin use.     Thank you for allowing pharmacy to be a part of this patient's care.  Enzo Bi, PharmD, BCPS Clinical Pharmacist Pager [phone] 10/06/2016 9:38 PM

## 2016-10-06 NOTE — Care Management Important Message (Signed)
Important Message  Patient Details  Name: Ryan Cunningham MRN: [id number] Date of Birth: [birth year]   Medicare Important Message Given:  Yes    Kyla Balzarine 10/06/2016, 11:33 AM

## 2016-10-07 LAB — RENAL FUNCTION PANEL
ALBUMIN: 1.8 g/dL — AB (ref 3.5–5.0)
Anion gap: 4 — ABNORMAL LOW (ref 5–15)
BUN: 56 mg/dL — AB (ref 6–20)
CO2: 22 mmol/L (ref 22–32)
CREATININE: 1.14 mg/dL (ref 0.61–1.24)
Calcium: 7.6 mg/dL — ABNORMAL LOW (ref 8.9–10.3)
Chloride: 123 mmol/L — ABNORMAL HIGH (ref 101–111)
GFR calc Af Amer: >60 mL/min (ref >60)
GFR, EST NON AFRICAN AMERICAN: 56 mL/min — AB (ref >60)
GLUCOSE: 178 mg/dL — AB (ref 65–99)
PHOSPHORUS: 2 mg/dL — AB (ref 2.5–4.6)
POTASSIUM: 3.7 mmol/L (ref 3.5–5.1)
SODIUM: 149 mmol/L — AB (ref 135–145)

## 2016-10-07 LAB — PROCALCITONIN: PROCALCITONIN: 0.12 ng/mL

## 2016-10-07 LAB — MAGNESIUM: Magnesium: 1.8 mg/dL (ref 1.7–2.4)

## 2016-10-07 MED ORDER — FREE WATER
200.0000 mL | Freq: Four times a day (QID) | Status: DC
  Administered 2016-10-07 – 2016-10-10 (×13): 200 mL

## 2016-10-07 MED ORDER — CEFDINIR 125 MG/5ML PO SUSR
300.0000 mg | Freq: Two times a day (BID) | ORAL | Status: DC
  Administered 2016-10-07 – 2016-10-08 (×3): 300 mg
  Filled 2016-10-07 (×4): qty 15

## 2016-10-07 MED ORDER — ENSURE ENLIVE PO LIQD
237.0000 mL | Freq: Two times a day (BID) | ORAL | Status: DC
  Administered 2016-10-07 – 2016-10-10 (×6): 237 mL

## 2016-10-07 MED ORDER — DEXTROSE 5 % IV SOLN
10.0000 mmol | Freq: Once | INTRAVENOUS | Status: AC
  Administered 2016-10-07: 10 mmol via INTRAVENOUS
  Filled 2016-10-07: qty 3.33

## 2016-10-07 MED ORDER — CARBIDOPA-LEVODOPA 10-100 MG PO TABS
2.0000 | ORAL_TABLET | Freq: Three times a day (TID) | ORAL | Status: DC
  Administered 2016-10-07 – 2016-10-13 (×12): 2 via NASOGASTRIC
  Filled 2016-10-07 (×24): qty 2

## 2016-10-07 NOTE — Progress Notes (Signed)
Subjective:   1600 of UOP  - kidney numbers improved- back to normal....  Hypernatremia finally improving as well d5w at 200 per hour- unfortunately MS still poor  Objective Vital signs in last 24 hours: Vitals:   10/06/16 0918 10/06/16 1717 10/06/16 2252 10/07/16 0449  BP: (!) 169/58 (!) 166/61 (!) 158/40 (!) 157/111  Pulse: (!) 46 (!) 40 89 79  Resp: Temp: 98.5 F (36.9 C) (!) 97.5 F (36.4 C) (!) 97.4 F (36.3 C) 98 F (36.7 C)  TempSrc: Axillary Axillary Oral Oral  SpO2: 97% 100% 96% 100%  Weight:      Height:       Weight change:   Intake/Output Summary (Last 24 hours) at 10/07/16 0935 Last data filed at 10/07/16 0604  Gross per 24 hour  Intake             6305 ml  Output             1625 ml  Net             4680 ml    Assessment/Plan: 83 year old white male with acute kidney injury, clinical urinary obstruction and ARB use 1.Renal- acute kidney injury- I suspect the main component was urinary obstruction. Renal ultrasound showed hydronephrosis. After Foley catheter placed significant amounts of urinary output and improvement of BUN and creatinine so that seems to have fixed the obstruction for now.  Would keep Foley in for now and will likely need urology input at some point for definitive treatment.  Renal function essentially back to normal 2. Hypertension/volume  - volume overload due to urinary obstruction. Polyuria initially, now better.  I would never put this man on Cozaar again 3. Anemia  - situational, no treatment needed right now 4. Hypernatremia- change IVF to d5- running at  200 per hour.   NGT placed  to give not only TF but free water.  Will change IVF to 75 per hour and give free water per tube - titrate both based on sodium level 5. MS- not sure of baseline- family had said no dementia, not sure they were evaluating the situation objectively.  With numbers improved MS should be improving and it is not  Renal will sign off- titrate d5 W and free  water to a normal sodium level, call with questions     Lorre Opdahl A    Labs: Basic Metabolic Panel:  Recent Labs Lab 10/06/16 0242 10/06/16 1605 10/06/16 2105 10/07/16 0534  NA 158* 153* 153* 149*  K 4.2 4.0 4.3 3.7  CL 129* 127* 126* 123*  CO2 24 20* 24 22  GLUCOSE 142* 172* 147* 178*  BUN 96* 74* 68* 56*  CREATININE 1.61* 1.41* 1.35* 1.14  CALCIUM 7.9* 7.9* 7.8* 7.6*  PHOS 2.8 2.4*  --  2.0*   Liver Function Tests:  Recent Labs Lab 10/03/16 1908  10/06/16 1605 10/06/16 2105 10/07/16 0534  AST 41  --   --  37  --   ALT 6*  --   --  25  --   ALKPHOS 209*  --   --  155*  --   BILITOT 0.9  --   --  1.4*  --   PROT 7.6  --   --  6.3*  --   ALBUMIN 2.4*  < > 1.9* 1.9* 1.8*  < > = values in this interval not displayed. No results for input(s): LIPASE, AMYLASE in the last 168 hours.  Recent  Labs Lab 10/03/16 2257  AMMONIA 27   CBC:  Recent Labs Lab 10/03/16 1908 10/04/16 0242 10/05/16 0532 10/06/16 0242  WBC 22.4* 17.7* 10.2 11.0*  NEUTROABS 20.1*  --  8.4* 8.8*  HGB 12.8* 10.8* 11.4* 10.5*  HCT 40.0 32.8* 35.6* 34.6*  MCV 85.5 84.5 86.8 89.4  PLT 310 258 341 313   Cardiac Enzymes:  Recent Labs Lab 10/03/16 1908  CKTOTAL 49   CBG: No results for input(s): GLUCAP in the last 168 hours.  Iron Studies: No results for input(s): IRON, TIBC, TRANSFERRIN, FERRITIN in the last 72 hours. Studies/Results: Dg Chest Port 1 View  Result Date: 10/05/2016 CLINICAL DATA:  Cough. EXAM: PORTABLE CHEST 1 VIEW COMPARISON:  10/03/2016 and 09/18/2016 FINDINGS: Lungs are adequately inflated with persistent left base opacification with slight interval improvement. No definite effusion. Mild stable cardiomegaly. Prominence of the main pulmonary artery segment. Mild calcified plaque over the thoracic aorta. Degenerative change of the spine. IMPRESSION: Persistent left base opacification with mild interval improvement likely resolving infection. Stable  cardiomegaly. Aortic Atherosclerosis (ICD10-I70.0). Electronically Signed   By: Elberta Fortis M.D.   On: 10/05/2016 18:26   Dg Abd Portable 1v  Result Date: 10/06/2016 CLINICAL DATA:  Nasogastric tube placement EXAM: PORTABLE ABDOMEN - 1 VIEW COMPARISON:  Abdominal radiograph 10/06/2016 FINDINGS: The side port of the nasogastric tube is at the level gastroesophageal junction. The tube should be advanced by 7-8 cm. Unchanged bowel gas pattern. IMPRESSION: NG tube side port at the gastroesophageal junction. Advance by 7-8 cm. Electronically Signed   By: Deatra Robinson M.D.   On: 10/06/2016 20:33   Dg Abd Portable 1v  Result Date: 10/06/2016 CLINICAL DATA:  RIGHT upper quadrant pain. EXAM: PORTABLE ABDOMEN - 1 VIEW COMPARISON:  CT abdomen and pelvis October 04, 2016 FINDINGS: Bowel gas pattern is nondilated and nonobstructive. No intra-abdominal mass effect, or free air. Punctate calcifications LEFT upper quadrant corresponding to the spleen on prior CT. Soft tissue planes and included osseous structures are non-suspicious. IMPRESSION: Normal bowel gas pattern. Electronically Signed   By: Awilda Metro M.D.   On: 10/06/2016 18:22   US Abdomen Limited Ruq  Result Date: 10/06/2016 CLINICAL DATA:  Right upper quadrant pain today. EXAM: ULTRASOUND ABDOMEN LIMITED RIGHT UPPER QUADRANT COMPARISON:  10/04/2016 CT FINDINGS: Gallbladder: A 1.8 cm calcified gallstone is noted without secondary signs of acute cholecystitis. No sonographic Murphy sign. Single wall thickness was normal at 2.7 mm. Common bile duct: Diameter: 4.1 mm Liver: No focal lesion identified. Within normal limits in parenchymal echogenicity. Portal vein is patent on color Doppler imaging with normal direction of blood flow towards the liver. Incidental suprarenal cyst is noted measuring 11.4 x 4.4 x 4.8 cm sonographically. IMPRESSION: 1. Uncomplicated cholelithiasis. No secondary signs of acute cholecystitis. 2. Retroperitoneal cyst on the  right above the upper pole of the kidney measuring 11.4 x 4.4 x 4.8 cm. Electronically Signed   By: Tollie Eth M.D.   On: 10/06/2016 22:58   Medications: Infusions: . cefTAZidime (FORTAZ)  IV 1 g (10/06/16 2241)  . dextrose 200 mL (10/07/16 0435)  . vancomycin 1,000 mg (10/07/16 0056)    Scheduled Medications: . atorvastatin  40 mg Oral q1800  . carbidopa-levodopa  2 tablet Oral TID WC  . chlorhexidine  15 mL Mouth Rinse BID  . enoxaparin (LOVENOX) injection  40 mg Subcutaneous Q24H  . feeding supplement (ENSURE ENLIVE)  237 mL Oral BID BM  . guaiFENesin  600 mg Oral BID  .  mouth rinse  15 mL Mouth Rinse q12n4p  . tamsulosin  0.4 mg Oral Daily    have reviewed scheduled and prn medications.  Physical Exam: General: unresponsive  Heart:RRR Lungs: mostly clear Abdomen: soft, non tender Extremities: some pitting edema     10/07/2016,9:35 AM  LOS: 4 days

## 2016-10-07 NOTE — Progress Notes (Signed)
Messaged MD to see if pt needs to be Q 4 CBG checks due to being NPO and on clamped NG tube. Awaiting response.   Larey Days, RN

## 2016-10-07 NOTE — Consult Note (Signed)
Neurology Consultation Reason for Consult: Altered mental status Referring Physician: Laurena Spies  CC: Altered mental status  History is obtained from: Patient  HPI: Ryan Cunningham is a 83 y.o. male with a history of Parkinson's disease, atrial fibrillation not on anticoagulation who presents with confusion and altered mental status. He apparently has "good days and bad days" but has been getting progressively weaker with failure to thrive. He was brought into the emergency department and had hypernatremia, severe renal failure, leukocytosis.  His renal failure was felt to be due to obstruction  Of note, he does have a history of Parkinson's disease and is now currently getting his medications.  LKW: Unclear tpa given?: no, unclear time of onset   ROS:  Unable to obtain due to altered mental status.   Past Medical History:  Diagnosis Date  . Acute bronchitis   . Acute bronchitis   . Acute maxillary sinusitis   . Atrial fibrillation (HCC)   . BPH (benign prostatic hyperplasia)   . Cardiomegaly   . Cellulitis and abscess of foot, except toes   . Cervicalgia   . Chronic kidney disease, unspecified   . Congestive heart failure, unspecified   . Coronary atherosclerosis of unspecified type of vessel, native or graft   . Edema   . Encounter for long-term (current) use of other medications   . Essential and other specified forms of tremor   . Impotence of organic origin   . Obstructive sleep apnea (adult) (pediatric)   . Other and unspecified hyperlipidemia   . Other atopic dermatitis and related conditions   . Other specified idiopathic peripheral neuropathy   . Paralysis agitans (HCC)   . Peripheral vascular disease, unspecified (HCC)   . Pressure ulcer, other site(707.09)   . Right bundle branch block and left anterior fascicular block   . Seborrhea   . Syncope and collapse   . Type II or unspecified type diabetes mellitus without mention of complication, not stated as  uncontrolled   . Ulcer of lower limb, unspecified   . Ulcer of other part of foot   . Unspecified essential hypertension   . Urinary frequency      Family History  Problem Relation Age of Onset  . Heart disease Father        CVA  . Heart disease Brother   . Heart disease Brother   . Heart disease Brother        MI  . Cancer Brother      Social History:  reports that he has quit smoking. His smoking use included Cigarettes. He has never used smokeless tobacco. He reports that he does not drink alcohol or use drugs.   Exam: Current vital signs: BP (!) 155/85   Pulse 80   Temp 98.2 F (36.8 C) (Oral)   Resp 18   Ht  (1.88 m)   Wt 84.6 kg (186 lb 8.2 oz)   SpO2 96%   BMI 23.95 kg/m  Vital signs in last 24 hours: Temp:  [97.4 F (36.3 C)-98.2 F (36.8 C)] 98.2 F (36.8 C) (09/29 1033) Pulse Rate:  [40-89] 80 (09/29 1033) Resp:  [16-18] 18 (09/29 1033) BP: (155-166)/(40-111) 155/85 (09/29 1033) SpO2:  [96 %-100 %] 96 % (09/29 1033)   Physical Exam  Constitutional: Appears well-developed and well-nourished.  Psych: Affect appropriate to situation Eyes: No scleral injection HENT: No OP obstrucion Head: Normocephalic.  Cardiovascular: Normal rate and regular rhythm.  Respiratory: Effort normal and breath sounds  normal to anterior ascultation GI: Soft.  No distension. There is no tenderness.  Skin: WDI  Neuro: Mental Status: Patient is awake, but does not reliably follow commands, he appears to have a significant bulbar dysfunction. His mouth partially open and he is very dysarthric. He appears very encephalopathic Cranial Nerves: II: Does not blink to threat reliably from either direction, but does fixate and track Pupils are equal, round, and reactive to light.   III,IV, VI: EOMI without ptosis or diploplia.  V: Facial sensation is symmetric to touch VII: He does not move his face much on either side Motor: He withdraws his left leg to noxious stimulus,  does not move his right leg, withdraws bilateral upper extremities to noxious stimulus Sensory: As above Cerebellar: Does not perform     I have reviewed labs in epic and the results pertinent to this consultation are: Ammonia-normal Sodium 149  I have reviewed the images obtained: CT head-atrophy but no acute change  Impression: 83 year old male with altered mental status in the setting of urinary obstruction with renal failure, hypernatremia, hospitalization. I suspect that some of his current appearance is due to lack of Parkinson's medications and I would strongly recommend restarting these via NG tube.  It's possible that this is responsible for the dysarthria/facial appearance, but I think that brainstem stroke would also be in the differential and I agree with an MRI of the brain.  Recommendations: 1) carbidopa levodopa at home dose, may need NG tube for this. 2) MRI brain 3) neurology will follow   Ritta Slot, MD [facility] [phone]  If 7pm- 7am, please page neurology on call as listed in AMION.

## 2016-10-07 NOTE — Progress Notes (Addendum)
[facility] PROGRESS NOTE  Ryan Cunningham ZOX:[id number] DOB: January 07, 1928 DOA: 10/03/2016 PCP: Jarome Matin, MD  Brief narrative: Patient admitted with acute kidney injury due to urinary obstruction which improved with Foley placement. Waxing and waning alertness blamed on infection. Cultures neg and was going to de-escalate antibiotics but then daughter caused inadvertent aspiration by feeding patient against his will with a straw.  HPI/Subjective: No issues overnight. States no pain.  Objective: Vitals:   10/07/16 0449 10/07/16 1033  BP: (!) 157/111 (!) 155/85  Pulse: 79 80  Resp: 16 18  Temp: 98 F (36.7 C) 98.2 F (36.8 C)  SpO2: 100% 96%    Intake/Output Summary (Last 24 hours) at 10/07/16 1635 Last data filed at 10/07/16 0604  Gross per 24 hour  Intake          3076.67 ml  Output              850 ml  Net          2226.67 ml   Filed Weights   10/03/16 1909 10/04/16 0141  Weight: 84.8 kg (187 lb) 84.6 kg (186 lb 8.2 oz)    Exam:   General:  NAD, NCAT, GCS15  Cardiovascular: RRR, no MRG  Respiratory: CTAB, incro wob  Abdomen: ND, NTTP  Data Reviewed: Basic Metabolic Panel:  Recent Labs Lab 10/04/16 0822  10/05/16 0532 10/05/16 1515 10/06/16 0242 10/06/16 1605 10/06/16 2105 10/07/16 0534 10/07/16 1035  NA  --   < > 155* 156* 158* 153* 153* 149*  --   K  --   < > 4.1 3.9 4.2 4.0 4.3 3.7  --   CL  --   < > 127* 127* 129* 127* 126* 123*  --   CO2  --   < > 19* 22 24 20* 24 22  --   GLUCOSE  --   < > 87 134* 142* 172* 147* 178*  --   BUN  --   < > 128* 114* 96* 74* 68* 56*  --   CREATININE  --   < > 2.46* 1.95* 1.61* 1.41* 1.35* 1.14  --   CALCIUM  --   < > 7.9* 7.8* 7.9* 7.9* 7.8* 7.6*  --   MG 2.8*  --  2.5*  --   --   --   --   --  1.8  PHOS 6.9*  --  4.5  --  2.8 2.4*  --  2.0*  --   < > = values in this interval not displayed. Liver Function Tests:  Recent Labs Lab 10/03/16 1908 10/05/16 0532 10/06/16 0242 10/06/16 1605  10/06/16 2105 10/07/16 0534  AST 41  --   --   --  37  --   ALT 6*  --   --   --  25  --   ALKPHOS 209*  --   --   --  155*  --   BILITOT 0.9  --   --   --  1.4*  --   PROT 7.6  --   --   --  6.3*  --   ALBUMIN 2.4* 1.9* 1.8* 1.9* 1.9* 1.8*   No results for input(s): LIPASE, AMYLASE in the last 168 hours.  Recent Labs Lab 10/03/16 2257  AMMONIA 27   CBC:  Recent Labs Lab 10/03/16 1908 10/04/16 0242 10/05/16 0532 10/06/16 0242  WBC 22.4* 17.7* 10.2 11.0*  NEUTROABS 20.1*  --  8.4* 8.8*  HGB 12.8* 10.8*  11.4* 10.5*  HCT 40.0 32.8* 35.6* 34.6*  MCV 85.5 84.5 86.8 89.4  PLT 310 258 341 313   Cardiac Enzymes:  Recent Labs Lab 10/03/16 1908  CKTOTAL 49   BNP (last 3 results)  Recent Labs  09/18/16 1909  BNP 651.8*    ProBNP (last 3 results) No results for input(s): PROBNP in the last 8760 hours.  CBG: No results for input(s): GLUCAP in the last 168 hours.  Recent Results (from the past 240 hour(s))  Blood Culture (routine x 2)     Status: None (Preliminary result)   Collection Time: 10/03/16  8:18 PM  Result Value Ref Range Status   Specimen Description BLOOD LEFT ANTECUBITAL  Final   Special Requests   Final    BOTTLES DRAWN AEROBIC AND ANAEROBIC Blood Culture adequate volume   Culture NO GROWTH 4 DAYS  Final   Report Status PENDING  Incomplete  Urine culture     Status: None   Collection Time: 10/03/16  8:20 PM  Result Value Ref Range Status   Specimen Description URINE, RANDOM  Final   Special Requests NONE  Final   Culture NO GROWTH  Final   Report Status 10/05/2016 FINAL  Final  Blood Culture (routine x 2)     Status: None (Preliminary result)   Collection Time: 10/03/16  8:22 PM  Result Value Ref Range Status   Specimen Description BLOOD RIGHT HAND  Final   Special Requests   Final    BOTTLES DRAWN AEROBIC AND ANAEROBIC Blood Culture adequate volume   Culture NO GROWTH 4 DAYS  Final   Report Status PENDING  Incomplete  MRSA PCR Screening      Status: None   Collection Time: 10/04/16  2:46 AM  Result Value Ref Range Status   MRSA by PCR NEGATIVE NEGATIVE Final    Comment:        The GeneXpert MRSA Assay (FDA approved for NASAL specimens only), is one component of a comprehensive MRSA colonization surveillance program. It is not intended to diagnose MRSA infection nor to guide or monitor treatment for MRSA infections.      Studies: Dg Chest Port 1 View  Result Date: 10/05/2016 CLINICAL DATA:  Cough. EXAM: PORTABLE CHEST 1 VIEW COMPARISON:  10/03/2016 and 09/18/2016 FINDINGS: Lungs are adequately inflated with persistent left base opacification with slight interval improvement. No definite effusion. Mild stable cardiomegaly. Prominence of the main pulmonary artery segment. Mild calcified plaque over the thoracic aorta. Degenerative change of the spine. IMPRESSION: Persistent left base opacification with mild interval improvement likely resolving infection. Stable cardiomegaly. Aortic Atherosclerosis (ICD10-I70.0). Electronically Signed   By: Elberta Fortis M.D.   On: 10/05/2016 18:26   Dg Abd Portable 1v  Result Date: 10/06/2016 CLINICAL DATA:  Nasogastric tube placement EXAM: PORTABLE ABDOMEN - 1 VIEW COMPARISON:  Abdominal radiograph 10/06/2016 FINDINGS: The side port of the nasogastric tube is at the level gastroesophageal junction. The tube should be advanced by 7-8 cm. Unchanged bowel gas pattern. IMPRESSION: NG tube side port at the gastroesophageal junction. Advance by 7-8 cm. Electronically Signed   By: Deatra Robinson M.D.   On: 10/06/2016 20:33   Dg Abd Portable 1v  Result Date: 10/06/2016 CLINICAL DATA:  RIGHT upper quadrant pain. EXAM: PORTABLE ABDOMEN - 1 VIEW COMPARISON:  CT abdomen and pelvis October 04, 2016 FINDINGS: Bowel gas pattern is nondilated and nonobstructive. No intra-abdominal mass effect, or free air. Punctate calcifications LEFT upper quadrant corresponding to the spleen on prior  CT. Soft tissue  planes and included osseous structures are non-suspicious. IMPRESSION: Normal bowel gas pattern. Electronically Signed   By: Awilda Metro M.D.   On: 10/06/2016 18:22   US Abdomen Limited Ruq  Result Date: 10/06/2016 CLINICAL DATA:  Right upper quadrant pain today. EXAM: ULTRASOUND ABDOMEN LIMITED RIGHT UPPER QUADRANT COMPARISON:  10/04/2016 CT FINDINGS: Gallbladder: A 1.8 cm calcified gallstone is noted without secondary signs of acute cholecystitis. No sonographic Murphy sign. Single wall thickness was normal at 2.7 mm. Common bile duct: Diameter: 4.1 mm Liver: No focal lesion identified. Within normal limits in parenchymal echogenicity. Portal vein is patent on color Doppler imaging with normal direction of blood flow towards the liver. Incidental suprarenal cyst is noted measuring 11.4 x 4.4 x 4.8 cm sonographically. IMPRESSION: 1. Uncomplicated cholelithiasis. No secondary signs of acute cholecystitis. 2. Retroperitoneal cyst on the right above the upper pole of the kidney measuring 11.4 x 4.4 x 4.8 cm. Electronically Signed   By: Tollie Eth M.D.   On: 10/06/2016 22:58    Scheduled Meds: . atorvastatin  40 mg Oral q1800  . carbidopa-levodopa  2 tablet Per NG tube TID  . chlorhexidine  15 mL Mouth Rinse BID  . enoxaparin (LOVENOX) injection  40 mg Subcutaneous Q24H  . feeding supplement (ENSURE ENLIVE)  237 mL Per Tube BID  . free water  200 mL Per Tube Q6H  . guaiFENesin  600 mg Oral BID  . mouth rinse  15 mL Mouth Rinse q12n4p  . tamsulosin  0.4 mg Oral Daily   Continuous Infusions: . cefTAZidime (FORTAZ)  IV Stopped (10/07/16 1020)  . dextrose 75 mL/hr at 10/07/16 0958  . sodium phosphate  Dextrose 5% IVPB 10 mmol (10/07/16 1055)  . vancomycin 1,000 mg (10/07/16 0056)    Principal Problem:   Acute renal failure (ARF) (HCC) Active Problems:   Parkinson disease (HCC)   Essential hypertension   Acute encephalopathy   Foot ulcer (HCC)   Hyperkalemia   Sepsis  (HCC)   Assessment/Plan:  . Acute renal failure:  Off diuretics. ARB contributing, no NSAIDs. However suspect this is actually from obstruction or infection. No previous history of obstruction.   -Maintain Foley, on d/c send home with leg back -Check urine lites, Still awaiting -Continue IV fluids and trend creatinine- now on D5 for hypernatremia -Follow intake and output, still making urine -Hold ARB, will never restart per nephrology -Obtain renal ultrasound, Shows hydronephrosis from obstructions   2. Possible sepsis, will rule out:  Suspected source unclear.  Patient meets criteria given tachypnea, leukocytosis, and evidence of organ dysfunction, although there are alternative explanations for his elevated lactic acid and leukocytosis. Poss CAP based on imgs. Ordered: IS, albuteril qid, albuterol prn, mucinex  -Sepsis bundle utilized:             -Blood and urine cultures drawn             -Fluid bolus given in ED, [place] wnl now             -Antibiotics: vancomycin/fortaz; cultures negative was going to de-escalate antibiotics but family induced aspiration--> switched on 9/29 to [facility]             -Repeat renal function and complete blood count in AM     3. Parkinson's:  -Continue Sinemet per tube -Hold depakote  4. Acute encephalopathy:  No dementia at baseline, suspect some MCI.  Encephalopathy likely from uremia, AKI.   -Check depakote (low) and ammonia (  wnl) levels -Check CT head (recent fall/head injury), neg - will address hypernatremia with free water; D5 incr by neprho, getting NGT - MRI pending  - Neuro input appreciated  5. Hypertension:  -Hold ARB, do not restart on d/c  6. Foot wounds:  This appears baseline -Consult WOC  7. Hyperkalemia, resolved:  Problem #1 above. ECG stable. -IV fluids -Trend K     DVT prophylaxis: Lovenox, low dose  Code Status: DO NOT RESUSCITATE  Family Communication: Daughter and son in law at [place]   Disposition Plan: Anticipate IV fluids, trend Cr.  Empiric antibiotics for now, follow culture data.  Likely 4-5 days admission. Consults called: None  Admission status: INPATIENT    Haydee Salter  AMION. If 7PM-7AM, please contact night-coverage at [email or web address], password [facility] 10/07/2016, 4:35 PM  LOS: 4 days

## 2016-10-08 ENCOUNTER — Inpatient Hospital Stay (HOSPITAL_COMMUNITY): Payer: Medicare Other

## 2016-10-08 DIAGNOSIS — I5031 Acute diastolic (congestive) heart failure: Secondary | ICD-10-CM

## 2016-10-08 LAB — CULTURE, BLOOD (ROUTINE X 2)
Culture: NO GROWTH
Culture: NO GROWTH
Special Requests: ADEQUATE
Special Requests: ADEQUATE

## 2016-10-08 LAB — RENAL FUNCTION PANEL
ANION GAP: 4 — AB (ref 5–15)
Albumin: 1.7 g/dL — ABNORMAL LOW (ref 3.5–5.0)
BUN: 30 mg/dL — ABNORMAL HIGH (ref 6–20)
CO2: 25 mmol/L (ref 22–32)
Calcium: 7.5 mg/dL — ABNORMAL LOW (ref 8.9–10.3)
Chloride: 116 mmol/L — ABNORMAL HIGH (ref 101–111)
Creatinine, Ser: 0.86 mg/dL (ref 0.61–1.24)
GFR calc non Af Amer: >60 mL/min (ref >60)
GLUCOSE: 131 mg/dL — AB (ref 65–99)
POTASSIUM: 3.7 mmol/L (ref 3.5–5.1)
Phosphorus: 1.9 mg/dL — ABNORMAL LOW (ref 2.5–4.6)
Sodium: 145 mmol/L (ref 135–145)

## 2016-10-08 LAB — CBC
HEMATOCRIT: 36.6 % — AB (ref 39.0–52.0)
HEMOGLOBIN: 11.4 g/dL — AB (ref 13.0–17.0)
MCH: 27.8 pg (ref 26.0–34.0)
MCHC: 31.1 g/dL (ref 30.0–36.0)
MCV: 89.3 fL (ref 78.0–100.0)
Platelets: 256 10*3/uL (ref 150–400)
RBC: 4.1 MIL/uL — AB (ref 4.22–5.81)
RDW: 14.7 % (ref 11.5–15.5)
WBC: 14.2 10*3/uL — ABNORMAL HIGH (ref 4.0–10.5)

## 2016-10-08 MED ORDER — SODIUM PHOSPHATES 45 MMOLE/15ML IV SOLN
10.0000 mmol | Freq: Once | INTRAVENOUS | Status: AC
  Administered 2016-10-08: 10 mmol via INTRAVENOUS
  Filled 2016-10-08: qty 3.33

## 2016-10-08 MED ORDER — GADOBENATE DIMEGLUMINE 529 MG/ML IV SOLN
20.0000 mL | Freq: Once | INTRAVENOUS | Status: AC | PRN
  Administered 2016-10-08: 18 mL via INTRAVENOUS

## 2016-10-08 MED ORDER — GUAIFENESIN 100 MG/5ML PO SOLN
400.0000 mg | Freq: Four times a day (QID) | ORAL | Status: DC | PRN
  Administered 2016-10-08: 400 mg
  Filled 2016-10-08: qty 5

## 2016-10-08 NOTE — Progress Notes (Signed)
Subjective: No significant change.s   Exam: Vitals:   10/08/16 0454 10/08/16 0830  BP: (!) 146/50 (!) 142/48  Pulse: (!) 35 (!) 55  Resp: 16 16  Temp: (!) 97.5 F (36.4 C) (!) 97.4 F (36.3 C)  SpO2: 100% 100%   Gen: In bed, NAD Resp: non-labored breathing, no acute distress Abd: soft, nt  Neuro: MS: OPenes eyes, fixates and tracks, does nto clearly follow commands, Speech is dysarthri cand not understandable.  ZO:XWRUE, EOMI Motor: he moves his upper extremities, though with increased tone. He has mild asterixis. He moves the left leg more than the right.  Sensory:responds to noxious stim.   Pertinent Labs: Increasing leukocytosis.   Impression: 83 year old male with altered mental status in the setting of urinary obstruction with renal failure, hypernatremia, hospitalization. I suspect that some of his current appearance is due to lack of Parkinson's medications. With negative MRI, I would favor addressing his underlying toxic/metabolic stressors and continue his home parkinsons medications. I suspect his improvement will be gradual and lag behind improvement in his labs.    Recommendations: 1)No further recommendations at this time. Please call with further questions or concerns.   Ritta Slot, MD [facility] [phone]  If 7pm- 7am, please page neurology on call as listed in AMION.

## 2016-10-08 NOTE — Progress Notes (Addendum)
[facility] PROGRESS NOTE  Ryan Cunningham:[id number] DOB: 02-13-1927 DOA: 10/03/2016 PCP: Jarome Matin, MD  Brief narrative: Patient admitted with acute kidney injury due to urinary obstruction which improved with Foley placement. Waxing and waning alertness blamed on infection. Cultures neg and was going to de-escalate antibiotics but then daughter caused inadvertent aspiration by feeding patient against his will with a straw.  HPI/Subjective: No issues overnight. Per nursing weaned of O2 and more active.  Objective: Vitals:   10/08/16 0454 10/08/16 0830  BP: (!) 146/50 (!) 142/48  Pulse: (!) 35 (!) 55  Resp: 16 16  Temp: (!) 97.5 F (36.4 C) (!) 97.4 F (36.3 C)  SpO2: 100% 100%    Intake/Output Summary (Last 24 hours) at 10/08/16 1435 Last data filed at 10/08/16 0605  Gross per 24 hour  Intake              637 ml  Output             1530 ml  Net             -893 ml   Filed Weights   10/03/16 1909 10/04/16 0141  Weight: 84.8 kg (187 lb) 84.6 kg (186 lb 8.2 oz)    Exam:   General:  NAD, NCAT, GCS14  Cardiovascular: RRR, no MRG  Respiratory: CTAB, nl wob  Abdomen: ND, NTTP  Data Reviewed: Basic Metabolic Panel:  Recent Labs Lab 10/04/16 0822  10/05/16 0532  10/06/16 0242 10/06/16 1605 10/06/16 2105 10/07/16 0534 10/07/16 1035 10/08/16 0801  NA  --   < > 155*  < > 158* 153* 153* 149*  --  145  K  --   < > 4.1  < > 4.2 4.0 4.3 3.7  --  3.7  CL  --   < > 127*  < > 129* 127* 126* 123*  --  116*  CO2  --   < > 19*  < > 24 20* 24 22  --  25  GLUCOSE  --   < > 87  < > 142* 172* 147* 178*  --  131*  BUN  --   < > 128*  < > 96* 74* 68* 56*  --  30*  CREATININE  --   < > 2.46*  < > 1.61* 1.41* 1.35* 1.14  --  0.86  CALCIUM  --   < > 7.9*  < > 7.9* 7.9* 7.8* 7.6*  --  7.5*  MG 2.8*  --  2.5*  --   --   --   --   --  1.8  --   PHOS 6.9*  --  4.5  --  2.8 2.4*  --  2.0*  --  1.9*  < > = values in this interval not displayed. Liver Function  Tests:  Recent Labs Lab 10/03/16 1908  10/06/16 0242 10/06/16 1605 10/06/16 2105 10/07/16 0534 10/08/16 0801  AST 41  --   --   --  37  --   --   ALT 6*  --   --   --  25  --   --   ALKPHOS 209*  --   --   --  155*  --   --   BILITOT 0.9  --   --   --  1.4*  --   --   PROT 7.6  --   --   --  6.3*  --   --   ALBUMIN 2.4*  < >  1.8* 1.9* 1.9* 1.8* 1.7*  < > = values in this interval not displayed. No results for input(s): LIPASE, AMYLASE in the last 168 hours.  Recent Labs Lab 10/03/16 2257  AMMONIA 27   CBC:  Recent Labs Lab 10/03/16 1908 10/04/16 0242 10/05/16 0532 10/06/16 0242 10/08/16 0801  WBC 22.4* 17.7* 10.2 11.0* 14.2*  NEUTROABS 20.1*  --  8.4* 8.8*  --   HGB 12.8* 10.8* 11.4* 10.5* 11.4*  HCT 40.0 32.8* 35.6* 34.6* 36.6*  MCV 85.5 84.5 86.8 89.4 89.3  PLT 310 258 341 313 256   Cardiac Enzymes:  Recent Labs Lab 10/03/16 1908  CKTOTAL 49   BNP (last 3 results)  Recent Labs  09/18/16 1909  BNP 651.8*    ProBNP (last 3 results) No results for input(s): PROBNP in the last 8760 hours.  CBG: No results for input(s): GLUCAP in the last 168 hours.  Recent Results (from the past 240 hour(s))  Blood Culture (routine x 2)     Status: None (Preliminary result)   Collection Time: 10/03/16  8:18 PM  Result Value Ref Range Status   Specimen Description BLOOD LEFT ANTECUBITAL  Final   Special Requests   Final    BOTTLES DRAWN AEROBIC AND ANAEROBIC Blood Culture adequate volume   Culture NO GROWTH 4 DAYS  Final   Report Status PENDING  Incomplete  Urine culture     Status: None   Collection Time: 10/03/16  8:20 PM  Result Value Ref Range Status   Specimen Description URINE, RANDOM  Final   Special Requests NONE  Final   Culture NO GROWTH  Final   Report Status 10/05/2016 FINAL  Final  Blood Culture (routine x 2)     Status: None (Preliminary result)   Collection Time: 10/03/16  8:22 PM  Result Value Ref Range Status   Specimen Description BLOOD  RIGHT HAND  Final   Special Requests   Final    BOTTLES DRAWN AEROBIC AND ANAEROBIC Blood Culture adequate volume   Culture NO GROWTH 4 DAYS  Final   Report Status PENDING  Incomplete  MRSA PCR Screening     Status: None   Collection Time: 10/04/16  2:46 AM  Result Value Ref Range Status   MRSA by PCR NEGATIVE NEGATIVE Final    Comment:        The GeneXpert MRSA Assay (FDA approved for NASAL specimens only), is one component of a comprehensive MRSA colonization surveillance program. It is not intended to diagnose MRSA infection nor to guide or monitor treatment for MRSA infections.      Studies: Mr Laqueta Jean ZO Contrast  Result Date: 10/08/2016 CLINICAL DATA:  83 y/o M; confusion and altered mental status with progressive weakness and failure to thrive. History of Parkinson's disease. EXAM: MRI HEAD WITHOUT AND WITH CONTRAST TECHNIQUE: Multiplanar, multiecho pulse sequences of the brain and surrounding structures were obtained without and with intravenous contrast. CONTRAST:  18mL MULTIHANCE GADOBENATE DIMEGLUMINE 529 MG/ML IV SOLN COMPARISON:  10/03/2016 CT of the head. FINDINGS: Brain: No acute infarction, hemorrhage, hydrocephalus, extra-axial collection or mass lesion. Small chronic infarction within the right lateral frontal cortex. Few nonspecific foci of T2 FLAIR hyperintense signal abnormality in subcortical and periventricular white matter are compatible with mild chronic microvascular ischemic changes for age. Mild brain parenchymal volume loss. Small hemosiderin stained chronic lacunar infarction of the right caudate head. After administration of intravenous contrast there is no abnormal enhancement of the brain. Vascular: Normal flow voids. Skull  and upper cervical spine: Normal marrow signal. Sinuses/Orbits: Trace bilateral mastoid effusions. No significant abnormal signal of paranasal sinuses. Bilateral intra-ocular lens replacement. Other: None. IMPRESSION: 1. No acute  intracranial abnormality or abnormal enhancement of the brain. 2. Mild for age chronic microvascular ischemic changes and mild parenchymal volume loss of the brain. 3. Small chronic right lateral frontal cortical infarction and right head of caudate nucleus infarction. Electronically Signed   By: Mitzi Hansen M.D.   On: 10/08/2016 06:28   Dg Abd Portable 1v  Result Date: 10/08/2016 CLINICAL DATA:  NG tube placement. EXAM: PORTABLE ABDOMEN - 1 VIEW COMPARISON:  10/06/2016 FINDINGS: An NG tube is identified with tip overlying the proximal stomach and side hole at the esophagogastric junction -recommend advancement. Visualized bowel gas pattern is unremarkable. IMPRESSION: NG tube with tip overlying the proximal stomach and side hole at the esophagogastric junction -recommend advancement. Electronically Signed   By: Harmon Pier M.D.   On: 10/08/2016 11:29   Dg Abd Portable 1v  Result Date: 10/06/2016 CLINICAL DATA:  Nasogastric tube placement EXAM: PORTABLE ABDOMEN - 1 VIEW COMPARISON:  Abdominal radiograph 10/06/2016 FINDINGS: The side port of the nasogastric tube is at the level gastroesophageal junction. The tube should be advanced by 7-8 cm. Unchanged bowel gas pattern. IMPRESSION: NG tube side port at the gastroesophageal junction. Advance by 7-8 cm. Electronically Signed   By: Deatra Robinson M.D.   On: 10/06/2016 20:33   Dg Abd Portable 1v  Result Date: 10/06/2016 CLINICAL DATA:  RIGHT upper quadrant pain. EXAM: PORTABLE ABDOMEN - 1 VIEW COMPARISON:  CT abdomen and pelvis October 04, 2016 FINDINGS: Bowel gas pattern is nondilated and nonobstructive. No intra-abdominal mass effect, or free air. Punctate calcifications LEFT upper quadrant corresponding to the spleen on prior CT. Soft tissue planes and included osseous structures are non-suspicious. IMPRESSION: Normal bowel gas pattern. Electronically Signed   By: Awilda Metro M.D.   On: 10/06/2016 18:22   US Abdomen Limited  Ruq  Result Date: 10/06/2016 CLINICAL DATA:  Right upper quadrant pain today. EXAM: ULTRASOUND ABDOMEN LIMITED RIGHT UPPER QUADRANT COMPARISON:  10/04/2016 CT FINDINGS: Gallbladder: A 1.8 cm calcified gallstone is noted without secondary signs of acute cholecystitis. No sonographic Murphy sign. Single wall thickness was normal at 2.7 mm. Common bile duct: Diameter: 4.1 mm Liver: No focal lesion identified. Within normal limits in parenchymal echogenicity. Portal vein is patent on color Doppler imaging with normal direction of blood flow towards the liver. Incidental suprarenal cyst is noted measuring 11.4 x 4.4 x 4.8 cm sonographically. IMPRESSION: 1. Uncomplicated cholelithiasis. No secondary signs of acute cholecystitis. 2. Retroperitoneal cyst on the right above the upper pole of the kidney measuring 11.4 x 4.4 x 4.8 cm. Electronically Signed   By: Tollie Eth M.D.   On: 10/06/2016 22:58    Scheduled Meds: . atorvastatin  40 mg Oral q1800  . carbidopa-levodopa  2 tablet Per NG tube TID  . cefdinir  300 mg Per Tube BID  . chlorhexidine  15 mL Mouth Rinse BID  . enoxaparin (LOVENOX) injection  40 mg Subcutaneous Q24H  . feeding supplement (ENSURE ENLIVE)  237 mL Per Tube BID  . free water  200 mL Per Tube Q6H  . mouth rinse  15 mL Mouth Rinse q12n4p  . tamsulosin  0.4 mg Oral Daily   Continuous Infusions: . dextrose 75 mL/hr at 10/07/16 2344  . sodium phosphate  Dextrose 5% IVPB 10 mmol (10/08/16 1110)    Principal Problem:  Acute renal failure (ARF) (HCC) Active Problems:   Parkinson disease (HCC)   Essential hypertension   Acute encephalopathy   Foot ulcer (HCC)   Hyperkalemia   Sepsis (HCC)   Assessment/Plan:  . Acute renal failure, resolving:  Off diuretics. ARB contributing, no NSAIDs. However suspect this is actually from obstruction or infection. No previous history of obstruction.   -Maintain Foley, on d/c send home with leg back -Continue IV fluids and trend  creatinine- now on D5 for hypernatremia -Follow intake and output, still making urine -Hold ARB, will never restart per nephrology -Obtain renal ultrasound, Shows hydronephrosis from obstructions   2. Possible sepsis, will rule out:  Suspected source unclear.  Patient meets criteria given tachypnea, leukocytosis, and evidence of organ dysfunction, although there are alternative explanations for his elevated lactic acid and leukocytosis. Poss CAP based on imgs. Ordered: IS, albuteril qid, albuterol prn, mucinex  -Sepsis bundle utilized:             -Blood and urine cultures drawn             -Fluid bolus given in ED, La wnl now             -Antibiotics: vancomycin/fortaz; cultures negative was going to de-escalate antibiotics but family induced aspiration--> [facility] on 9/29 to [facility]             -Repeat renal function and complete blood count in AM - check CXR, asp?     3. Parkinson's:  -Continue Sinemet per tube -Hold depakote  4. Acute encephalopathy:  No dementia at baseline, suspect some MCI.  Encephalopathy likely from uremia, AKI.   -Check depakote (low) and ammonia (wnl) levels -Check CT head (recent fall/head injury), neg - free water; D5 infusion & getting NGT - MRI neg - Neuro input appreciated  5. Hypertension:  -Hold ARB, do not restart on d/c  6. Foot wounds:  This appears baseline -Consult WOC  7. Hyperkalemia, resolved:  Problem #1 above. ECG stable. -IV fluids -Trend K     DVT prophylaxis: Lovenox, low dose  Code Status: DO NOT RESUSCITATE  Family Communication: Daughter and son in law at [place]  Disposition Plan: Anticipate IV fluids, trend Cr.  Empiric antibiotics for now, follow culture data.  Likely 4-5 days admission. Consults called: None  Admission status: INPATIENT    Haydee Salter  AMION. If 7PM-7AM, please contact night-coverage at [email or web address], password [facility] 10/08/2016, 2:35 PM  LOS: 5 days

## 2016-10-09 DIAGNOSIS — J189 Pneumonia, unspecified organism: Secondary | ICD-10-CM

## 2016-10-09 LAB — RENAL FUNCTION PANEL
ANION GAP: 6 (ref 5–15)
Albumin: 1.8 g/dL — ABNORMAL LOW (ref 3.5–5.0)
BUN: 25 mg/dL — ABNORMAL HIGH (ref 6–20)
CHLORIDE: 114 mmol/L — AB (ref 101–111)
CO2: 23 mmol/L (ref 22–32)
Calcium: 7.4 mg/dL — ABNORMAL LOW (ref 8.9–10.3)
Creatinine, Ser: 0.81 mg/dL (ref 0.61–1.24)
GFR calc Af Amer: >60 mL/min (ref >60)
GFR calc non Af Amer: >60 mL/min (ref >60)
GLUCOSE: 163 mg/dL — AB (ref 65–99)
POTASSIUM: 3.9 mmol/L (ref 3.5–5.1)
Phosphorus: 2.1 mg/dL — ABNORMAL LOW (ref 2.5–4.6)
SODIUM: 143 mmol/L (ref 135–145)

## 2016-10-09 LAB — BLOOD GAS, ARTERIAL
Acid-Base Excess: 1.6 mmol/L (ref 0.0–2.0)
Bicarbonate: 25.4 mmol/L (ref 20.0–28.0)
DRAWN BY: 398991
FIO2: 21
O2 SAT: 94.4 %
PATIENT TEMPERATURE: 98.6
PCO2 ART: 38 mmHg (ref 32.0–48.0)
PH ART: 7.439 (ref 7.350–7.450)
PO2 ART: 72.2 mmHg — AB (ref 83.0–108.0)

## 2016-10-09 LAB — GLUCOSE, CAPILLARY
GLUCOSE-CAPILLARY: 165 mg/dL — AB (ref 65–99)
Glucose-Capillary: 109 mg/dL — ABNORMAL HIGH (ref 65–99)
Glucose-Capillary: 138 mg/dL — ABNORMAL HIGH (ref 65–99)

## 2016-10-09 LAB — AMMONIA: AMMONIA: 28 umol/L (ref 9–35)

## 2016-10-09 MED ORDER — VANCOMYCIN HCL IN DEXTROSE 1-5 GM/200ML-% IV SOLN
1000.0000 mg | Freq: Two times a day (BID) | INTRAVENOUS | Status: DC
  Administered 2016-10-09 – 2016-10-10 (×4): 1000 mg via INTRAVENOUS
  Filled 2016-10-09 (×6): qty 200

## 2016-10-09 MED ORDER — SODIUM PHOSPHATES 45 MMOLE/15ML IV SOLN
20.0000 mmol | Freq: Once | INTRAVENOUS | Status: AC
  Administered 2016-10-09: 20 mmol via INTRAVENOUS
  Filled 2016-10-09: qty 6.67

## 2016-10-09 MED ORDER — HYDROCODONE-ACETAMINOPHEN 7.5-325 MG/15ML PO SOLN: 15.0000 mL | ORAL | Status: DC | PRN

## 2016-10-09 MED ORDER — IPRATROPIUM-ALBUTEROL 0.5-2.5 (3) MG/3ML IN SOLN
3.0000 mL | Freq: Four times a day (QID) | RESPIRATORY_TRACT | Status: DC
  Administered 2016-10-09 – 2016-10-10 (×5): 3 mL via RESPIRATORY_TRACT
  Filled 2016-10-09 (×5): qty 3

## 2016-10-09 MED ORDER — DEXTROSE 5 % IV SOLN
1.0000 g | Freq: Three times a day (TID) | INTRAVENOUS | Status: DC
  Administered 2016-10-09: 1 g via INTRAVENOUS
  Filled 2016-10-09 (×2): qty 1

## 2016-10-09 MED ORDER — DEXTROSE 5 % IV SOLN
1.0000 g | Freq: Three times a day (TID) | INTRAVENOUS | Status: DC
  Administered 2016-10-09 – 2016-10-12 (×7): 1 g via INTRAVENOUS
  Filled 2016-10-09 (×9): qty 1

## 2016-10-09 NOTE — Progress Notes (Addendum)
[facility] PROGRESS NOTE  Ryan Cunningham:[id number] DOB: 08-13-1927 DOA: 10/03/2016 PCP: Jarome Matin, MD  Brief narrative: Patient admitted with acute kidney injury due to urinary obstruction which improved with Foley placement. Waxing and waning alertness blamed on infection. Cultures neg and was going to de-escalate antibiotics but then daughter caused inadvertent aspiration by feeding patient against his will with a straw.  HPI/Subjective: No issues overnight.   Objective: Vitals:   10/09/16 0401 10/09/16 1024  BP: (!) 147/48 (!) 135/42  Pulse: 62 61  Resp: 18 20  Temp: 98 F (36.7 C) 98 F (36.7 C)  SpO2: 100% 99%    Intake/Output Summary (Last 24 hours) at 10/09/16 1513 Last data filed at 10/09/16 0915  Gross per 24 hour  Intake              637 ml  Output              850 ml  Net             -213 ml   Filed Weights   10/04/16 0141  Weight: 84.6 kg (186 lb 8.2 oz)    Exam:   General:  NAD, NCAT, GCS15  Cardiovascular: RRR, no MRG  Respiratory: CTAB, nl wob  Abdomen: ND, NTTP  Data Reviewed: Basic Metabolic Panel:  Recent Labs Lab 10/04/16 0822  10/05/16 0532  10/06/16 0242 10/06/16 1605 10/06/16 2105 10/07/16 0534 10/07/16 1035 10/08/16 0801 10/09/16 0240  NA  --   < > 155*  < > 158* 153* 153* 149*  --  145 143  K  --   < > 4.1  < > 4.2 4.0 4.3 3.7  --  3.7 3.9  CL  --   < > 127*  < > 129* 127* 126* 123*  --  116* 114*  CO2  --   < > 19*  < > 24 20* 24 22  --  25 23  GLUCOSE  --   < > 87  < > 142* 172* 147* 178*  --  131* 163*  BUN  --   < > 128*  < > 96* 74* 68* 56*  --  30* 25*  CREATININE  --   < > 2.46*  < > 1.61* 1.41* 1.35* 1.14  --  0.86 0.81  CALCIUM  --   < > 7.9*  < > 7.9* 7.9* 7.8* 7.6*  --  7.5* 7.4*  MG 2.8*  --  2.5*  --   --   --   --   --  1.8  --   --   PHOS 6.9*  --  4.5  --  2.8 2.4*  --  2.0*  --  1.9* 2.1*  < > = values in this interval not displayed. Liver Function Tests:  Recent Labs Lab 10/03/16 1908   10/06/16 1605 10/06/16 2105 10/07/16 0534 10/08/16 0801 10/09/16 0240  AST 41  --   --  37  --   --   --   ALT 6*  --   --  25  --   --   --   ALKPHOS 209*  --   --  155*  --   --   --   BILITOT 0.9  --   --  1.4*  --   --   --   PROT 7.6  --   --  6.3*  --   --   --   ALBUMIN 2.4*  < >  1.9* 1.9* 1.8* 1.7* 1.8*  < > = values in this interval not displayed. No results for input(s): LIPASE, AMYLASE in the last 168 hours.  Recent Labs Lab 10/03/16 2257 10/09/16 1022  AMMONIA 27 28   CBC:  Recent Labs Lab 10/03/16 1908 10/04/16 0242 10/05/16 0532 10/06/16 0242 10/08/16 0801  WBC 22.4* 17.7* 10.2 11.0* 14.2*  NEUTROABS 20.1*  --  8.4* 8.8*  --   HGB 12.8* 10.8* 11.4* 10.5* 11.4*  HCT 40.0 32.8* 35.6* 34.6* 36.6*  MCV 85.5 84.5 86.8 89.4 89.3  PLT 310 258 341 313 256   Cardiac Enzymes:  Recent Labs Lab 10/03/16 1908  CKTOTAL 49   BNP (last 3 results)  Recent Labs  09/18/16 1909  BNP 651.8*    ProBNP (last 3 results) No results for input(s): PROBNP in the last 8760 hours.  CBG:  Recent Labs Lab 10/09/16 1203  GLUCAP 165*    Recent Results (from the past 240 hour(s))  Blood Culture (routine x 2)     Status: None   Collection Time: 10/03/16  8:18 PM  Result Value Ref Range Status   Specimen Description BLOOD LEFT ANTECUBITAL  Final   Special Requests   Final    BOTTLES DRAWN AEROBIC AND ANAEROBIC Blood Culture adequate volume   Culture NO GROWTH 5 DAYS  Final   Report Status 10/08/2016 FINAL  Final  Urine culture     Status: None   Collection Time: 10/03/16  8:20 PM  Result Value Ref Range Status   Specimen Description URINE, RANDOM  Final   Special Requests NONE  Final   Culture NO GROWTH  Final   Report Status 10/05/2016 FINAL  Final  Blood Culture (routine x 2)     Status: None   Collection Time: 10/03/16  8:22 PM  Result Value Ref Range Status   Specimen Description BLOOD RIGHT HAND  Final   Special Requests   Final    BOTTLES DRAWN  AEROBIC AND ANAEROBIC Blood Culture adequate volume   Culture NO GROWTH 5 DAYS  Final   Report Status 10/08/2016 FINAL  Final  MRSA PCR Screening     Status: None   Collection Time: 10/04/16  2:46 AM  Result Value Ref Range Status   MRSA by PCR NEGATIVE NEGATIVE Final    Comment:        The GeneXpert MRSA Assay (FDA approved for NASAL specimens only), is one component of a comprehensive MRSA colonization surveillance program. It is not intended to diagnose MRSA infection nor to guide or monitor treatment for MRSA infections.      Studies: Mr Laqueta Jean ZO Contrast  Result Date: 10/08/2016 CLINICAL DATA:  83 y/o M; confusion and altered mental status with progressive weakness and failure to thrive. History of Parkinson's disease. EXAM: MRI HEAD WITHOUT AND WITH CONTRAST TECHNIQUE: Multiplanar, multiecho pulse sequences of the brain and surrounding structures were obtained without and with intravenous contrast. CONTRAST:  18mL MULTIHANCE GADOBENATE DIMEGLUMINE 529 MG/ML IV SOLN COMPARISON:  10/03/2016 CT of the head. FINDINGS: Brain: No acute infarction, hemorrhage, hydrocephalus, extra-axial collection or mass lesion. Small chronic infarction within the right lateral frontal cortex. Few nonspecific foci of T2 FLAIR hyperintense signal abnormality in subcortical and periventricular white matter are compatible with mild chronic microvascular ischemic changes for age. Mild brain parenchymal volume loss. Small hemosiderin stained chronic lacunar infarction of the right caudate head. After administration of intravenous contrast there is no abnormal enhancement of the brain. Vascular: Normal flow  voids. Skull and upper cervical spine: Normal marrow signal. Sinuses/Orbits: Trace bilateral mastoid effusions. No significant abnormal signal of paranasal sinuses. Bilateral intra-ocular lens replacement. Other: None. IMPRESSION: 1. No acute intracranial abnormality or abnormal enhancement of the brain. 2.  Mild for age chronic microvascular ischemic changes and mild parenchymal volume loss of the brain. 3. Small chronic right lateral frontal cortical infarction and right head of caudate nucleus infarction. Electronically Signed   By: Mitzi Hansen M.D.   On: 10/08/2016 06:28   Dg Chest Port 1 View  Result Date: 10/08/2016 CLINICAL DATA:  Cough.  Fever. EXAM: PORTABLE CHEST 1 VIEW COMPARISON:  10/05/2016. FINDINGS: Stable enlarged cardiac silhouette. Interval patchy density at the left lung base with underlying linear density noted. Small amount of interval linear density at the right lung base. Nasogastric tube extending into the stomach. Unremarkable bones. IMPRESSION: 1. Interval patchy density at the left lung base suspicious for pneumonia. 2. Bibasilar linear atelectasis. 3. Stable mild cardiomegaly. Electronically Signed   By: Beckie Salts M.D.   On: 10/08/2016 19:24   Dg Abd Portable 1v  Result Date: 10/08/2016 CLINICAL DATA:  NG tube placement. EXAM: PORTABLE ABDOMEN - 1 VIEW COMPARISON:  10/06/2016 FINDINGS: An NG tube is identified with tip overlying the proximal stomach and side hole at the esophagogastric junction -recommend advancement. Visualized bowel gas pattern is unremarkable. IMPRESSION: NG tube with tip overlying the proximal stomach and side hole at the esophagogastric junction -recommend advancement. Electronically Signed   By: Harmon Pier M.D.   On: 10/08/2016 11:29    Scheduled Meds: . atorvastatin  40 mg Oral q1800  . carbidopa-levodopa  2 tablet Per NG tube TID  . chlorhexidine  15 mL Mouth Rinse BID  . enoxaparin (LOVENOX) injection  40 mg Subcutaneous Q24H  . feeding supplement (ENSURE ENLIVE)  237 mL Per Tube BID  . free water  200 mL Per Tube Q6H  . mouth rinse  15 mL Mouth Rinse q12n4p  . tamsulosin  0.4 mg Oral Daily   Continuous Infusions: . cefTAZidime (FORTAZ)  IV    . dextrose 10 mL/hr at 10/09/16 1047  . vancomycin Stopped (10/09/16 1314)     Principal Problem:   Acute renal failure (ARF) (HCC) Active Problems:   Parkinson disease (HCC)   Essential hypertension   Acute encephalopathy   Foot ulcer (HCC)   Hyperkalemia   Sepsis (HCC)   Assessment/Plan:  . Acute renal failure, resolving:  Off diuretics. ARB contributing, no NSAIDs. However suspect this is actually from obstruction or infection. No previous history of obstruction.   -Maintain Foley, on d/c send home with leg back -Continue IV fluids and trend creatinine- now on D5 for hypernatremia decr rate -Follow intake and output, still making urine -Hold ARB, will never restart per nephrology -Obtain renal ultrasound, Shows hydronephrosis from obstructions   2. Possible sepsis, will rule out:  Suspected source unclear.  Patient meets criteria given tachypnea, leukocytosis, and evidence of organ dysfunction, although there are alternative explanations for his elevated lactic acid and leukocytosis. Poss CAP based on imgs. Ordered: IS, albuteril qid, albuterol prn, mucinex  -Sepsis bundle utilized:             -Blood and urine cultures drawn             -Fluid bolus given in ED, La wnl now             -Antibiotics: vancomycin/fortaz; cultures negative was going to de-escalate antibiotics but family induced  aspiration--> switched on 9/29 to [facility]             -Repeat renal function and complete blood count in AM  - RUQ pain; neg RUQ U/S and KUB Pna developed 9/30, placed back on broad spectrum abx, likely cause of #4, sch duonebs, mucinex down NGT   3. Parkinson's:  -Continue Sinemet per tube -Hold depakote  4. Acute encephalopathy:  No dementia at baseline, suspect some MCI.  Encephalopathy likely infection at this point as other issues addressed -Check depakote (low) and ammonia (wnl) levels -Check CT head (recent fall/head injury), neg - free water; D5 infusion & getting NGT - MRI neg - Neuro input appreciated, signed off - displaying signs of  delirium  5. Hypertension:  -Hold ARB, do not restart on d/c  6. Foot wounds:  This appears baseline -Consult WOC  7. Hyperkalemia, resolved:  Problem #1 above. ECG stable. -IV fluids -Trend K   DVT prophylaxis: Lovenox, low dose  Code Status: DO NOT RESUSCITATE  Family Communication: Daughter and son in law at [place]  Disposition Plan:  Incr abx coverage, TBD Consults called: None  Admission status: INPATIENT    Haydee Salter  AMION. If 7PM-7AM, please contact night-coverage at [email or web address], password [facility] 10/09/2016, 3:13 PM  LOS: 6 days

## 2016-10-09 NOTE — Progress Notes (Deleted)
Pt refused labs this am.   Pelagia Iacobucci M Teancum Brule, RN  

## 2016-10-09 NOTE — Care Management Important Message (Signed)
Important Message  Patient Details  Name: Ryan Cunningham MRN: [id number] Date of Birth: 13-Apr-1927   Medicare Important Message Given:  Yes    Kyla Balzarine 10/09/2016, 9:19 AM

## 2016-10-09 NOTE — Progress Notes (Signed)
Pharmacy Antibiotic Note  Ryan Cunningham is a 83 y.o. male admitted on 10/03/2016 with sepsis. Pharmacy re-consulted today 10/09/16 to resume Vancomycin for possible sepsis, unclear source.  Dr. Melynda Ripple has also resumed Ceftazidime today.  The patient has an allergy to penicillins listed as a rash however has tolerated cephalosporins previously.  Abx D#7 total antibiotics for r/o sepsis. Afebrile, WBC 11>14.2k AKI resolved,  SCr 0.81. CrCl ~ 74 ml/min  Foot wounds appear at baseline per Dr. Tanda Rockers 9/25 >>9/29;  Restart 10/1>> Ceftazidime 9/26 >>9/29; Restart 10/1>> Aztreonam 9/25 >> 9/26 Levaquin 9/25 >> 9/26 Cefdinir PO 9/29>10/1  9/25 BCx: negative 9/25 UCx: negative 9/26 MRSA PCR: negative   Plan: Restart Vancomycin 1g IV q12h  Ceftazidime restarted 1g IV Q8h per MD Monitor clinical picture, c/s, abx deescalation / LOT, renal function, Vanc Trough prn F/U C&S, abx deescalation / LOT   Height:  (188 cm) Weight:  (bed scale does not work) IBW/kg (Calculated) : 82.2  Temp (24hrs), Avg:97.8 F (36.6 C), Min:97.5 F (36.4 C), Max:98 F (36.7 C)   Recent Labs Lab 10/03/16 1908 10/03/16 1914 10/03/16 2257 10/04/16 0241 10/04/16 0242  10/05/16 0532  10/06/16 0242 10/06/16 1605 10/06/16 2105 10/07/16 0534 10/08/16 0801 10/09/16 0240  WBC 22.4*  --   --   --  17.7*  --  10.2  --  11.0*  --   --   --  14.2*  --   CREATININE 8.04*  --   --   --  5.77*  < > 2.46*  < > 1.61* 1.41* 1.35* 1.14 0.86 0.81  LATICACIDVEN  --  2.47* 1.5 1.3  --   --   --   --   --   --   --   --   --   --   < > = values in this interval not displayed.  Estimated Creatinine Clearance: 74.7 mL/min (by C-G formula based on SCr of 0.81 mg/dL).    Allergies  Allergen Reactions  . Penicillins Rash    Has patient had a PCN reaction causing immediate rash, facial/tongue/throat swelling, SOB or lightheadedness with hypotension: Yes Has patient had a PCN reaction causing severe rash involving  mucus membranes or skin necrosis: No Has patient had a PCN reaction that required hospitalization: No Has patient had a PCN reaction occurring within the last 10 years: No If all of the above answers are "NO", then may proceed with Cephalosporin use.     Thank you for allowing pharmacy to be a part of this patient's care.  Noah Delaine, RPh Clinical Pharmacist Pager: [phone] 10/09/2016 10:05 AM

## 2016-10-10 LAB — GLUCOSE, CAPILLARY
GLUCOSE-CAPILLARY: 112 mg/dL — AB (ref 65–99)
GLUCOSE-CAPILLARY: 110 mg/dL — AB (ref 65–99)
GLUCOSE-CAPILLARY: 139 mg/dL — AB (ref 65–99)
Glucose-Capillary: 80 mg/dL (ref 65–99)
Glucose-Capillary: 140 mg/dL — ABNORMAL HIGH (ref 65–99)
Glucose-Capillary: 119 mg/dL — ABNORMAL HIGH (ref 65–99)

## 2016-10-10 LAB — MAGNESIUM: MAGNESIUM: 1.3 mg/dL — AB (ref 1.7–2.4)

## 2016-10-10 LAB — CBC WITH DIFFERENTIAL/PLATELET
BASOS ABS: 0 10*3/uL (ref 0.0–0.1)
BASOS PCT: 0 %
EOS PCT: 1 %
Eosinophils Absolute: 0.1 10*3/uL (ref 0.0–0.7)
HCT: 37.5 % — ABNORMAL LOW (ref 39.0–52.0)
Hemoglobin: 11.5 g/dL — ABNORMAL LOW (ref 13.0–17.0)
Lymphocytes Relative: 5 %
Lymphs Abs: 1 10*3/uL (ref 0.7–4.0)
MCH: 27.4 pg (ref 26.0–34.0)
MCHC: 30.7 g/dL (ref 30.0–36.0)
MCV: 89.5 fL (ref 78.0–100.0)
MONO ABS: 0.9 10*3/uL (ref 0.1–1.0)
Monocytes Relative: 5 %
Neutro Abs: 18 10*3/uL — ABNORMAL HIGH (ref 1.7–7.7)
Neutrophils Relative %: 89 %
PLATELETS: 279 10*3/uL (ref 150–400)
RBC: 4.19 MIL/uL — ABNORMAL LOW (ref 4.22–5.81)
RDW: 14.9 % (ref 11.5–15.5)
WBC: 20 10*3/uL — ABNORMAL HIGH (ref 4.0–10.5)

## 2016-10-10 LAB — PHOSPHORUS: PHOSPHORUS: 2.7 mg/dL (ref 2.5–4.6)

## 2016-10-10 LAB — CREATININE, SERUM
Creatinine, Ser: 0.88 mg/dL (ref 0.61–1.24)
GFR calc Af Amer: >60 mL/min (ref >60)

## 2016-10-10 LAB — RENAL FUNCTION PANEL
Albumin: 1.8 g/dL — ABNORMAL LOW (ref 3.5–5.0)
Anion gap: 8 (ref 5–15)
BUN: 24 mg/dL — ABNORMAL HIGH (ref 6–20)
CHLORIDE: 108 mmol/L (ref 101–111)
CO2: 24 mmol/L (ref 22–32)
Calcium: 7.3 mg/dL — ABNORMAL LOW (ref 8.9–10.3)
Creatinine, Ser: 0.9 mg/dL (ref 0.61–1.24)
Glucose, Bld: 126 mg/dL — ABNORMAL HIGH (ref 65–99)
POTASSIUM: 3.7 mmol/L (ref 3.5–5.1)
Phosphorus: 2.8 mg/dL (ref 2.5–4.6)
Sodium: 140 mmol/L (ref 135–145)

## 2016-10-10 MED ORDER — MAGNESIUM SULFATE 2 GM/50ML IV SOLN
2.0000 g | Freq: Once | INTRAVENOUS | Status: AC
  Administered 2016-10-10: 2 g via INTRAVENOUS
  Filled 2016-10-10: qty 50

## 2016-10-10 MED ORDER — IPRATROPIUM-ALBUTEROL 0.5-2.5 (3) MG/3ML IN SOLN
3.0000 mL | Freq: Three times a day (TID) | RESPIRATORY_TRACT | Status: DC
  Administered 2016-10-11 – 2016-10-12 (×4): 3 mL via RESPIRATORY_TRACT
  Filled 2016-10-10 (×4): qty 3

## 2016-10-10 NOTE — Progress Notes (Signed)
[facility] PROGRESS NOTE  Ryan Cunningham XLK:[id number] DOB: 1927-08-07 DOA: 10/03/2016 PCP: Jarome Matin, MD  Brief narrative: Patient admitted with acute kidney injury due to urinary obstruction which improved with Foley placement. Waxing and waning alertness blamed on infection. Cultures neg and was going to de-escalate antibiotics but then daughter caused inadvertent aspiration by feeding patient against his will with a straw. Neurology called and and they signed off stating nothing else to offer but agree with MRI. MRI did not show acute stroke. Possible pneumonia on chest x-ray but no fever or other symptoms currently. Prognosis is poor.  HPI/Subjective: No issues overnight. Alert this AM.  Objective: Vitals:   10/10/16 1000 10/10/16 1407  BP: (!) 161/54   Pulse: (!) 45   Resp: 18   Temp: 98.6 F (37 C)   SpO2: 98% 96%    Intake/Output Summary (Last 24 hours) at 10/10/16 1413 Last data filed at 10/10/16 0900  Gross per 24 hour  Intake          2006.84 ml  Output              926 ml  Net          1080.84 ml   Filed Weights    Exam:   General:  NAD, NCAT, GCS15  Cardiovascular: RRR, no MRG  Respiratory: CTAB, nl wob  Abdomen: ND, NTTP  Data Reviewed: Basic Metabolic Panel:  Recent Labs Lab 10/04/16 0822  10/05/16 0532  10/06/16 1605 10/06/16 2105 10/07/16 0534 10/07/16 1035 10/08/16 0801 10/09/16 0240 10/10/16 0715  NA  --   < > 155*  < > 153* 153* 149*  --  145 143 140  K  --   < > 4.1  < > 4.0 4.3 3.7  --  3.7 3.9 3.7  CL  --   < > 127*  < > 127* 126* 123*  --  116* 114* 108  CO2  --   < > 19*  < > 20* 24 22  --  GLUCOSE  --   < > 87  < > 172* 147* 178*  --  131* 163* 126*  BUN  --   < > 128*  < > 74* 68* 56*  --  30* 25* 24*  CREATININE  --   < > 2.46*  < > 1.41* 1.35* 1.14  --  0.86 0.81 0.88  0.90  CALCIUM  --   < > 7.9*  < > 7.9* 7.8* 7.6*  --  7.5* 7.4* 7.3*  MG 2.8*  --  2.5*  --   --   --   --  1.8  --   --  1.3*  PHOS  6.9*  --  4.5  < > 2.4*  --  2.0*  --  1.9* 2.1* 2.7  2.8  < > = values in this interval not displayed. Liver Function Tests:  Recent Labs Lab 10/03/16 1908  10/06/16 2105 10/07/16 0534 10/08/16 0801 10/09/16 0240 10/10/16 0715  AST 41  --  37  --   --   --   --   ALT 6*  --  25  --   --   --   --   ALKPHOS 209*  --  155*  --   --   --   --   BILITOT 0.9  --  1.4*  --   --   --   --   PROT 7.6  --  6.3*  --   --   --   --   ALBUMIN 2.4*  < > 1.9* 1.8* 1.7* 1.8* 1.8*  < > = values in this interval not displayed. No results for input(s): LIPASE, AMYLASE in the last 168 hours.  Recent Labs Lab 10/03/16 2257 10/09/16 1022  AMMONIA 27 28   CBC:  Recent Labs Lab 10/03/16 1908 10/04/16 0242 10/05/16 0532 10/06/16 0242 10/08/16 0801 10/10/16 0925  WBC 22.4* 17.7* 10.2 11.0* 14.2* 20.0*  NEUTROABS 20.1*  --  8.4* 8.8*  --  18.0*  HGB 12.8* 10.8* 11.4* 10.5* 11.4* 11.5*  HCT 40.0 32.8* 35.6* 34.6* 36.6* 37.5*  MCV 85.5 84.5 86.8 89.4 89.3 89.5  PLT 310 258 341 313 256 279   Cardiac Enzymes:  Recent Labs Lab 10/03/16 1908  CKTOTAL 49   BNP (last 3 results)  Recent Labs  09/18/16 1909  BNP 651.8*    ProBNP (last 3 results) No results for input(s): PROBNP in the last 8760 hours.  CBG:  Recent Labs Lab 10/09/16 2008 10/09/16 2356 10/10/16 0406 10/10/16 0757 10/10/16 1146  GLUCAP 109* 138* 112* 110* 140*    Recent Results (from the past 240 hour(s))  Blood Culture (routine x 2)     Status: None   Collection Time: 10/03/16  8:18 PM  Result Value Ref Range Status   Specimen Description BLOOD LEFT ANTECUBITAL  Final   Special Requests   Final    BOTTLES DRAWN AEROBIC AND ANAEROBIC Blood Culture adequate volume   Culture NO GROWTH 5 DAYS  Final   Report Status 10/08/2016 FINAL  Final  Urine culture     Status: None   Collection Time: 10/03/16  8:20 PM  Result Value Ref Range Status   Specimen Description URINE, RANDOM  Final   Special Requests NONE   Final   Culture NO GROWTH  Final   Report Status 10/05/2016 FINAL  Final  Blood Culture (routine x 2)     Status: None   Collection Time: 10/03/16  8:22 PM  Result Value Ref Range Status   Specimen Description BLOOD RIGHT HAND  Final   Special Requests   Final    BOTTLES DRAWN AEROBIC AND ANAEROBIC Blood Culture adequate volume   Culture NO GROWTH 5 DAYS  Final   Report Status 10/08/2016 FINAL  Final  MRSA PCR Screening     Status: None   Collection Time: 10/04/16  2:46 AM  Result Value Ref Range Status   MRSA by PCR NEGATIVE NEGATIVE Final    Comment:        The GeneXpert MRSA Assay (FDA approved for NASAL specimens only), is one component of a comprehensive MRSA colonization surveillance program. It is not intended to diagnose MRSA infection nor to guide or monitor treatment for MRSA infections.      Studies: Dg Chest Port 1 View  Result Date: 10/08/2016 CLINICAL DATA:  Cough.  Fever. EXAM: PORTABLE CHEST 1 VIEW COMPARISON:  10/05/2016. FINDINGS: Stable enlarged cardiac silhouette. Interval patchy density at the left lung base with underlying linear density noted. Small amount of interval linear density at the right lung base. Nasogastric tube extending into the stomach. Unremarkable bones. IMPRESSION: 1. Interval patchy density at the left lung base suspicious for pneumonia. 2. Bibasilar linear atelectasis. 3. Stable mild cardiomegaly. Electronically Signed   By: Beckie Salts M.D.   On: 10/08/2016 19:24    Scheduled Meds: . atorvastatin  40 mg Oral q1800  . carbidopa-levodopa  2  tablet Per NG tube TID  . chlorhexidine  15 mL Mouth Rinse BID  . enoxaparin (LOVENOX) injection  40 mg Subcutaneous Q24H  . feeding supplement (ENSURE ENLIVE)  237 mL Per Tube BID  . free water  200 mL Per Tube Q6H  . ipratropium-albuterol  3 mL Nebulization Q6H  . mouth rinse  15 mL Mouth Rinse q12n4p  . tamsulosin  0.4 mg Oral Daily   Continuous Infusions: . cefTAZidime (FORTAZ)  IV  Stopped (10/10/16 1358)  . dextrose 10 mL/hr at 10/09/16 1047  . vancomycin Stopped (10/10/16 1234)    Principal Problem:   Acute renal failure (ARF) (HCC) Active Problems:   Parkinson disease (HCC)   Essential hypertension   Acute encephalopathy   Foot ulcer (HCC)   Hyperkalemia   Sepsis (HCC)   Assessment/Plan:  . Acute renal failure, resolved:  Off diuretics. ARB contributing, no NSAIDs. However suspect this is actually from obstruction or infection. No previous history of obstruction.   -Maintain Foley, on d/c send home with leg back -Continue IV fluids and trend creatinine- now on D5 for hypernatremia decr rate -Follow intake and output, still making urine -Hold ARB, will never restart per nephrology -Obtain renal ultrasound, Shows hydronephrosis from obstructions   2. Possible sepsis, will rule out:  Suspected source unclear.  Patient meets criteria given tachypnea, leukocytosis, and evidence of organ dysfunction, although there are alternative explanations for his elevated lactic acid and leukocytosis. Poss CAP based on imgs. Ordered: IS, albuteril qid, albuterol prn, mucinex  -Sepsis bundle utilized:             -Blood and urine cultures drawn             -Fluid bolus given in ED, La wnl now             -Antibiotics: vancomycin/fortaz; cultures negative was going to de-escalate antibiotics but family induced aspiration--> [facility] on 9/29 to [facility]             -Repeat renal function and complete blood count in AM  - RUQ pain; neg RUQ U/S and KUB Pna developed 9/30, placed back on broad spectrum abx, likely cause of #4, sch duonebs   3. Parkinson's:  -Continue Sinemet per tube -Hold depakote  4. Acute encephalopathy:  No dementia at baseline, suspect some MCI.  Encephalopathy likely infection at this point as other issues addressed -Check depakote (low) and ammonia (wnl) levels -Check CT head (recent fall/head injury), neg - MRI neg - Neuro input  appreciated, signed off - displaying signs of delirium  5. Hypertension:  -Hold ARB, do not restart on d/c  6. Foot wounds:  This appears baseline -Consult WOC  7. Hyperkalemia, resolved:  Problem #1 above. ECG stable. -IV fluids -Trend K   DVT prophylaxis: Lovenox, low dose  Code Status: DO NOT RESUSCITATE  Family Communication: Daughter and son in law at [place]  Disposition Plan:  Incr abx coverage, TBD Consults called: None  Admission status: INPATIENT    Haydee Salter  AMION. If 7PM-7AM, please contact night-coverage at [email or web address], password [facility] 10/10/2016, 2:13 PM  LOS: 7 days

## 2016-10-11 DIAGNOSIS — Z7189 Other specified counseling: Secondary | ICD-10-CM

## 2016-10-11 DIAGNOSIS — J690 Pneumonitis due to inhalation of food and vomit: Secondary | ICD-10-CM

## 2016-10-11 DIAGNOSIS — Z515 Encounter for palliative care: Secondary | ICD-10-CM

## 2016-10-11 LAB — RENAL FUNCTION PANEL
ALBUMIN: 1.7 g/dL — AB (ref 3.5–5.0)
Anion gap: 8 (ref 5–15)
BUN: 20 mg/dL (ref 6–20)
CALCIUM: 7.4 mg/dL — AB (ref 8.9–10.3)
CO2: 25 mmol/L (ref 22–32)
Chloride: 104 mmol/L (ref 101–111)
Creatinine, Ser: 0.9 mg/dL (ref 0.61–1.24)
GFR calc Af Amer: >60 mL/min (ref >60)
GFR calc non Af Amer: >60 mL/min (ref >60)
GLUCOSE: 120 mg/dL — AB (ref 65–99)
PHOSPHORUS: 2.4 mg/dL — AB (ref 2.5–4.6)
POTASSIUM: 3.9 mmol/L (ref 3.5–5.1)
SODIUM: 137 mmol/L (ref 135–145)

## 2016-10-11 LAB — VANCOMYCIN, TROUGH: VANCOMYCIN TR: 21 ug/mL — AB (ref 15–20)

## 2016-10-11 LAB — GLUCOSE, CAPILLARY
GLUCOSE-CAPILLARY: 116 mg/dL — AB (ref 65–99)
GLUCOSE-CAPILLARY: 118 mg/dL — AB (ref 65–99)

## 2016-10-11 MED ORDER — VANCOMYCIN HCL IN DEXTROSE 750-5 MG/150ML-% IV SOLN
750.0000 mg | Freq: Two times a day (BID) | INTRAVENOUS | Status: DC
  Filled 2016-10-11: qty 150

## 2016-10-11 MED ORDER — SODIUM CHLORIDE 0.9 % IV SOLN
INTRAVENOUS | Status: DC
  Administered 2016-10-11: 18:00:00 via INTRAVENOUS

## 2016-10-11 MED ORDER — MORPHINE SULFATE (PF) 2 MG/ML IV SOLN: 1.0000 mg | INTRAVENOUS | Status: DC | PRN

## 2016-10-11 NOTE — Progress Notes (Signed)
Consultation Note Date: 10/11/2016   Patient Name: Ryan Cunningham  DOB: [birth year]  MRN: [id number]  Age / Sex: 83 y.o., male  PCP: Leanna Battles, MD Referring Physician: Debbe Odea, MD  Reason for Consultation: Establishing goals of care, no improvement  HPI/Patient Profile: 83 y.o. male  with past medical history of Parkinsons, HTN, and foot ulcers admitted on 10/03/2016 with AMS and AKI d/t obstruction. At baseline, pt lives independently at home with wife - walks independently, can perform all ADLs without assistance, drives. Daughter reports slight memory issues at baseline. In early September was admitted to hospital for pna and discharged to SNF for 10 days. Daughter reports intermittent confusion at baseline with "good and bad days". She feels he has become progressively weaker with less PO intake since SNF admission.   Foley catheter placed on admission to treat AKI. No previous history of obstruction. Plan is to d/c pt with foley in place. During admission, pt developed pneumonia during admission d/t aspiration. Pt also displaying some signs of delerium/acute encephalopathy. MRI to r/o stroke - negative.  Palliative care consulted to establish goals of care and patient's failure to improve despite aggressive medical interventions.   Clinical Assessment and Goals of Care: Met with patient's daughter and wife to discuss goals of care. We discussed palliative care and hospice care with the family.   Family reports the decline they have seen since his earlier admission in September; they note his inability to bounce back like he once did. We discussed his mental status changes and inability to take in nutrition and hydration.   They tell us that the patient recently completed a DNR and Living will during his previous admission. They are certain he would never want a feeding tube. They emphasize their desire to focus on quality, comfort, and  dignity at this time.   We discussed the differences between an aggressive medical path and a comfort path. We discussed comfort feedings for the patient and disposition plan. Family expresses that patient would like to pass at home but they feel they are unable to provide the care he would need at home. They are interested in residential hospice placement.   Primary Decision Maker NEXT OF KIN - wife    SUMMARY OF RECOMMENDATIONS   - Comfort care - No feeding tube - Comfort feedings as tolerated/when awake - Residential Hospice  Code Status/Advance Care Planning:  DNR   Symptom Management:   Morphine prn dyspnea and pain  Palliative Prophylaxis:   Aspiration, Delirium Protocol, Frequent Pain Assessment, Oral Care and Turn Reposition  Additional Recommendations (Limitations, Scope, Preferences):  Full Comfort Care, Minimize Medications, Initiate Comfort Feeding, No Artificial Feeding and No Glucose Monitoring  Psycho-social/Spiritual:   Desire for further Chaplaincy support:no  Additional Recommendations: Education on Hospice  Prognosis:   < 2 weeks with continued altered mental status, aspiration risk, inability to maintain adequate nutrition  Discharge Planning: Hospice facility      Primary Diagnoses: Present on Admission: . Acute renal failure (ARF) ([place]) . Acute encephalopathy . Hyperkalemia . Essential hypertension . Foot ulcer ([place]) . Parkinson disease ([place]) . Sepsis ([place])   I have reviewed the medical record, interviewed the patient and family, and examined the patient. The following aspects are pertinent.  Past Medical History:  Diagnosis Date  . Acute bronchitis   . Acute bronchitis   . Acute maxillary sinusitis   . Atrial fibrillation ([place])   . BPH (benign prostatic hyperplasia)   . Cardiomegaly   .  Cellulitis and abscess of foot, except toes   . Cervicalgia   . Chronic kidney disease, unspecified   . Congestive heart failure, unspecified    . Coronary atherosclerosis of unspecified type of vessel, native or graft   . Edema   . Encounter for long-term (current) use of other medications   . Essential and other specified forms of tremor   . Impotence of organic origin   . Obstructive sleep apnea (adult) (pediatric)   . Other and unspecified hyperlipidemia   . Other atopic dermatitis and related conditions   . Other specified idiopathic peripheral neuropathy   . Paralysis agitans ([place])   . Peripheral vascular disease, unspecified ([place])   . Pressure ulcer, other site(707.09)   . Right bundle branch block and left anterior fascicular block   . Seborrhea   . Syncope and collapse   . Type II or unspecified type diabetes mellitus without mention of complication, not stated as uncontrolled   . Ulcer of lower limb, unspecified   . Ulcer of other part of foot   . Unspecified essential hypertension   . Urinary frequency    Social History   Social History  . Marital status: Married    Spouse name: N/A  . Number of children: N/A  . Years of education: N/A   Social History Main Topics  . Smoking status: Former Smoker    Types: Cigarettes  . Smokeless tobacco: Never Used  . Alcohol use No  . Drug use: No  . Sexual activity: Not Asked   Other Topics Concern  . None   Social History Narrative  . None   Family History  Problem Relation Age of Onset  . Heart disease Father        CVA  . Heart disease Brother   . Heart disease Brother   . Heart disease Brother        MI  . Cancer Brother    Scheduled Meds: . atorvastatin  40 mg Oral q1800  . carbidopa-levodopa  2 tablet Per NG tube TID  . chlorhexidine  15 mL Mouth Rinse BID  . enoxaparin (LOVENOX) injection  40 mg Subcutaneous Q24H  . free water  200 mL Per Tube Q6H  . ipratropium-albuterol  3 mL Nebulization TID  . mouth rinse  15 mL Mouth Rinse q12n4p  . tamsulosin  0.4 mg Oral Daily   Continuous Infusions: . cefTAZidime (FORTAZ)  IV 1 g (10/11/16 0604)    . dextrose 100 mL/hr at 10/11/16 0354  . vancomycin Stopped (10/10/16 2321)   PRN Meds:.acetaminophen **OR** acetaminophen, albuterol, guaiFENesin, ondansetron **OR** ondansetron (ZOFRAN) IV Allergies  Allergen Reactions  . Penicillins Rash    Has patient had a PCN reaction causing immediate rash, facial/tongue/throat swelling, SOB or lightheadedness with hypotension: Yes Has patient had a PCN reaction causing severe rash involving mucus membranes or skin necrosis: No Has patient had a PCN reaction that required hospitalization: No Has patient had a PCN reaction occurring within the last 10 years: No If all of the above answers are "NO", then may proceed with Cephalosporin use.    Review of Systems  Constitutional: Positive for activity change, appetite change and fatigue.  Respiratory: Negative for shortness of breath.   Neurological: Positive for weakness.  Psychiatric/Behavioral:       Withdrawn  Per family  Physical Exam  Constitutional: No distress.  Pulmonary/Chest: Effort normal. No respiratory distress.  Neurological:  Obtunded, disoriented x3  Skin: Skin is warm and dry.  Vital Signs: BP (!) 132/46 (BP Location: Left Arm)   Pulse (!) 43   Temp 98.4 F (36.9 C) (Oral)   Resp 18   Ht 6' 2"  (1.88 m)   Wt 84.6 kg (186 lb 8.2 oz)   SpO2 95%   BMI 23.95 kg/m  Pain Assessment: Faces   Pain Score: 0-No pain   SpO2: SpO2: 95 % O2 Device:SpO2: 95 % O2 Flow Rate: .   IO: Intake/output summary:  Intake/Output Summary (Last 24 hours) at 10/11/16 1125 Last data filed at 10/11/16 0604  Gross per 24 hour  Intake             2447 ml  Output             1525 ml  Net              922 ml    LBM: Last BM Date: 10/09/16 Baseline Weight: Weight: 84.8 kg (187 lb) Most recent weight: Weight:  (Bed scale does not work)     Palliative Assessment/Data: 20%     Time In: 12:30 Time Out: 13:50 Time Total: 80 Greater than 50%  of this time was spent counseling and  coordinating care related to the above assessment and plan.  Juel Burrow, DNP, AGNP-C Palliative Medicine Team [phone]  Vinie Sill, NP Palliative Medicine Team Pager # [phone] (M-F 8a-5p) Team Phone # [phone] (Nights/Weekends)

## 2016-10-11 NOTE — Progress Notes (Signed)
Nutrition Follow-up  DOCUMENTATION CODES:   Severe malnutrition in context of chronic illness  INTERVENTION:   -RD will follow for diet advancement and supplement as appropriate -If pt remains unable to take PO's and family desires aggressive care, recommend temporary nutrition support via cortrak tube. Recommend:  Initiate Jevity 1.2 @ 20 ml/hr via cortrak and increase by 10 ml every 12 hours to goal rate of 70 ml/hr.   30 ml Prostat daily.    Tube feeding regimen provides 2116 kcal (100% of needs), 108 grams of protein, and 1356 ml of H2O.   -If nutrition support is initiated, recommend monitor Mg, K, and Phos daily x 3 days and replete as needed, as pt is at high refeeding risk  NUTRITION DIAGNOSIS:   Malnutrition (severe) related to chronic illness (CHF/CKD/intermittent confusion) as evidenced by severe depletion of muscle mass, severe depletion of body fat.  Ongoing  GOAL:   Patient will meet greater than or equal to 90% of their needs  Unmet  MONITOR:   PO intake, Supplement acceptance, Weight trends, I & O's  REASON FOR ASSESSMENT:   Low Braden    ASSESSMENT:   Pt with PMH of HTN, OSA, Afib, CKD, Type II DM, PVD, BPH, CHF, Parkinson's disease, multiple wounds presents with altered mental status found acute renal failure suspected to be r/t obstruction  9/27- aspiration related to family members force feeding pt with straw 9/28- NPO, NGT placed for free water and medications 10/2- NGT removed  Pt somnolent at time of visit. No family available to provide additional hx.   Pt has been NPO since 10/06/16, due to AMS and inability to take PO's. SLP completed BSE today; recommend continued NPO due to severe aspiration risk. Pt not able to participate in MBSS. Palliative care team has been consulted for goals of care discussions due to poor progress.   Labs reviewed: CBGS: 116-140.   Diet Order:  Diet NPO time specified  Skin:  Wound (see comment) (lt hip  abrasion, DTI rt heel, st 2 back/rt foot, st 3 lt foo)  Last BM:  10/09/16  Height:   Ht Readings from Last 1 Encounters:  10/04/16  (1.88 m)    Weight:   Wt Readings from Last 1 Encounters:  09/21/16 187 lb 13.3 oz (85.2 kg)    Ideal Body Weight:  86.4 kg  BMI:  Body mass index is 23.95 kg/m.  Estimated Nutritional Needs:   Kcal:  2100-2300  Protein:  105-115 grams  Fluid:  >/= 2 L/d  EDUCATION NEEDS:   Education needs no appropriate at this time  Laia Wiley A. Mayford Knife, RD, LDN, CDE Pager: [phone] After hours Pager: [phone]
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
## 2016-10-11 NOTE — Progress Notes (Signed)
Pharmacy Antibiotic Note  Ryan Cunningham is a 83 y.o. male admitted on 10/03/2016 with sepsis. Pharmacy re-consulted  10/09/16 to resume Vancomycin for possible sepsis, unclear source. Ceftazidime also resumed on 10/1.  The patient has an allergy to penicillins listed as a rash however has tolerated cephalosporins previously.  Abx D#9  total antibiotics for r/o sepsis,  ? Aspiration pneumonia. Afebrile, WBC 11>14.2k>20k AKI resolved,  SCr 0.90. CrCl ~ 67 ml/min  Vancomycin trough = 21 mcg/ml on 1g IV q12h    Vanc 9/25 >>9/29;  Restart 10/1>> Ceftazidime 9/26 >>9/29; Restart 10/1>> Aztreonam 9/25 >> 9/26 Levaquin 9/25 >> 9/26 Cefdinir PO 9/29>10/1  9/25 BCx: negative 9/25 UCx: negative 9/26 MRSA PCR: negative   Plan: Decrease Vancomycin to 750 mg IV q24h  Monitor clinical picture, c/s, abx deescalation / LOT, renal function, Vanc Trough prn F/U C&S, abx deescalation / LOT   Height:  (188 cm) Weight:  (Bed scale does not work) IBW/kg (Calculated) : 82.2  Temp (24hrs), Avg:98.3 F (36.8 C), Min:97.7 F (36.5 C), Max:98.6 F (37 C)   Recent Labs Lab 10/05/16 0532  10/06/16 0242  10/07/16 0534 10/08/16 0801 10/09/16 0240 10/10/16 0715 10/10/16 0925 10/11/16 0502 10/11/16 1228  WBC 10.2  --  11.0*  --   --  14.2*  --   --  20.0*  --   --   CREATININE 2.46*  < > 1.61*  < > 1.14 0.86 0.81 0.88  0.90  --  0.90  --   VANCOTROUGH  --   --   --   --   --   --   --   --   --   --  21*  < > = values in this interval not displayed.  Estimated Creatinine Clearance: 67.2 mL/min (by C-G formula based on SCr of 0.9 mg/dL).    Allergies  Allergen Reactions  . Penicillins Rash    Has patient had a PCN reaction causing immediate rash, facial/tongue/throat swelling, SOB or lightheadedness with hypotension: Yes Has patient had a PCN reaction causing severe rash involving mucus membranes or skin necrosis: No Has patient had a PCN reaction that required hospitalization: No Has  patient had a PCN reaction occurring within the last 10 years: No If all of the above answers are "NO", then may proceed with Cephalosporin use.     Thank you for allowing pharmacy to be a part of this patient's care.  Noah Delaine, RPh Clinical Pharmacist Pager: [phone] 10/11/2016 2:48 PM
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
## 2016-10-12 DIAGNOSIS — Z7189 Other specified counseling: Secondary | ICD-10-CM

## 2016-10-12 DIAGNOSIS — J690 Pneumonitis due to inhalation of food and vomit: Secondary | ICD-10-CM

## 2016-10-12 DIAGNOSIS — Z515 Encounter for palliative care: Secondary | ICD-10-CM

## 2016-10-12 MED ORDER — GLYCOPYRROLATE 0.2 MG/ML IJ SOLN
0.2000 mg | INTRAMUSCULAR | Status: DC | PRN
  Filled 2016-10-12: qty 1

## 2016-10-12 MED ORDER — GLYCOPYRROLATE 0.2 MG/ML IJ SOLN: 0.4000 mg | INTRAMUSCULAR | Status: DC | PRN

## 2016-10-12 NOTE — Progress Notes (Signed)
[facility] Hospital Liaison Note:  Received request from [place], CSW of family interest in [facility].  Chart reviewed and spoke with wife and daughter to confirm interest. Family is aware that [facility] does not have bed available today.  Morrie Sheldon, CSW also made aware.  Hospital liaison will follow up tomorrow.  Thank you, Hessie Knows RN, [facility] Liaison [phone]

## 2016-10-12 NOTE — Care Management Important Message (Signed)
Important Message  Patient Details  Name: Ryan Cunningham MRN: [id number] Date of Birth: [birth year]   Medicare Important Message Given:  Yes    Lakaisha Danish Abena 10/12/2016, 10:20 AM

## 2016-10-12 NOTE — Progress Notes (Signed)
Nutrition Brief Note  Chart reviewed. Pt now transitioning to comfort care.  No further nutrition interventions warranted at this time.  Please re-consult as needed.   Kelsa Jaworowski RD, LDN, CNSC [phone] Pager [phone] After Hours Pager    

## 2016-10-12 NOTE — Clinical Social Work Note (Signed)
Clinical Social Work Assessment  Patient Details  Name: TAGGART PRASAD MRN: [id number] Date of Birth: 18-Sep-1927  Date of referral:  10/12/16               Reason for consult:  End of Life/Hospice, Discharge Planning                Permission sought to share information with:  Family Supports Permission granted to share information::  Yes, Verbal Permission Granted  Name::     Mishawn Hemann  Agency::  hospice  Relationship::  spouse  Contact Information:  [phone]  Housing/Transportation Living arrangements for the past 2 months:  Single Family Home Source of Information:  Adult Children, Spouse Patient Interpreter Needed:  None Criminal Activity/Legal Involvement Pertinent to Current Situation/Hospitalization:  No - Comment as needed Significant Relationships:  Adult Children, Spouse Lives with:  Spouse Do you feel safe going back to the place where you live?  Yes Need for family participation in patient care:  Yes (Comment)  Care giving concerns:Patients spouse and daughter at bedside. Per palliative note, patient would like to go home and live the rest of his life at home but unfortunately  family will be unable to accommodate that.  Social Worker assessment / plan:  Holiday representative met patient's family at bedside to discuss residential hospice. Family stated they would like patient to stay as close to home as possible during his last couple of days with them. Family stated they would like [facility] because patients daughter has had experience with the facility in the past. CSW reach out to [facility] and unfortunately facility is full at this time.  Facility stated they will still meet with family and hopefully they have a bed available for patient in the morning. CSW also gave family a list of hospice facility in the area they could choose from since [facility] does not have a bed today. CSW to follow up with family for any new decision.   Employment  status:  Retired Nurse, adult PT Recommendations:   (Hospice facility) Information / Referral to community resources:     Patient/Family's Response to care:  Family appreciative of CSW role in care and is also very thankful of Hospital staff  Patient/Family's Understanding of and Emotional Response to Diagnosis, Current Treatment, and Prognosis:  Family is coming to terms with patients diagnoses  Emotional Assessment Appearance:  Appears stated age Attitude/Demeanor/Rapport:  Unable to Assess Affect (typically observed):  Unable to Assess Orientation:   (disoriented x 4) Alcohol / Substance use:  Not Applicable Psych involvement (Current and /or in the community):  No (Comment)  Discharge Needs  Concerns to be addressed:    Readmission within the last 30 days:  Yes Current discharge risk:  Terminally ill Barriers to Discharge:  Hospice Bed not available   Wende Neighbors, LCSW 10/12/2016, 12:56 PM

## 2016-10-12 NOTE — Progress Notes (Signed)
Daily Progress Note   Patient Name: Ryan Cunningham       Date: 10/12/2016 DOB: [birth year]  Age: 83 y.o. MRN#: [id number] Attending Physician: Debbe Odea, MD Primary Care Physician: Leanna Battles, MD Admit Date: 10/03/2016  Reason for Consultation/Follow-up: Disposition, Establishing goals of care and Inpatient hospice referral  Subjective: Patient resting in bed. Not responsive to stimulus. Daughter and wife at bedside.  Length of Stay: 9  Current Medications: Scheduled Meds:  . carbidopa-levodopa  2 tablet Per NG tube TID  . chlorhexidine  15 mL Mouth Rinse BID  . mouth rinse  15 mL Mouth Rinse q12n4p    Continuous Infusions:   PRN Meds: acetaminophen **OR** acetaminophen, albuterol, glycopyrrolate, guaiFENesin, morphine injection, ondansetron **OR** ondansetron (ZOFRAN) IV  Physical Exam  Constitutional: No distress.  HENT:  Oral secretions/rattling noted  Cardiovascular: Normal rate.   Pulmonary/Chest: Effort normal. No respiratory distress.  Abdominal: Soft.  Musculoskeletal: He exhibits edema.  Upper ext  Neurological:  Obtunded, disoriented x3  Skin: Skin is warm and dry. He is not diaphoretic.  Bandaged upper extremity skin tears and foot ulcers  Psychiatric:  withdrawn            Vital Signs: BP (!) 181/49 (BP Location: Right Arm)   Pulse (!) 42   Temp 97.9 F (36.6 C) (Oral)   Resp 19   Ht _0  (1.88 m)   Wt 84.6 kg (186 lb 8.2 oz)   SpO2 98%   BMI 23.95 kg/m  SpO2: SpO2: 98 % O2 Device: O2 Device: Not Delivered O2 Flow Rate:    Intake/output summary:   Intake/Output Summary (Last 24 hours) at 10/12/16 1317 Last data filed at 10/12/16 0900  Gross per 24 hour  Intake             1235 ml  Output             1400 ml  Net             -165 ml   LBM:  Last BM Date: 10/09/16 Baseline Weight: Weight: 84.8 kg (187 lb) Most recent weight: Weight:  (Bed scale display does not work.)       Palliative Assessment/Data:20%    Flowsheet Rows     Most Recent Value  Intake Tab  Referral Department  Hospitalist  Unit at Time of Referral  Cardiac/Telemetry Unit  Palliative Care Primary Diagnosis  Neurology  Date Notified  10/10/16  Palliative Care Type  New Palliative care  Reason for referral  Clarify Goals of Care  Date of Admission  10/03/16  Date first seen by Palliative Care  10/11/16  # of days Palliative referral response time  1 Day(s)  # of days IP prior to Palliative referral  7  Clinical Assessment  Palliative Performance Scale Score  20%  Psychosocial & Spiritual Assessment  Palliative Care Outcomes      Patient Active Problem List   Diagnosis Date Noted  . Aspiration pneumonia ([place])   . Goals of care, counseling/discussion   . Palliative care encounter   . Acute renal failure (ARF) ([place]) 10/03/2016  . Hyperkalemia 10/03/2016  . Sepsis ([place]) 10/03/2016  . Acute encephalopathy 09/18/2016  .  Anemia 09/18/2016  . Hypervolemia 09/18/2016  . Elevated troponin 09/18/2016  . Foot ulcer ([place]) 09/18/2016  . CAP (community acquired pneumonia) 09/18/2016  . Diabetic ulcer of left midfoot associated with type 2 diabetes mellitus, limited to breakdown of skin ([place]) 08/01/2016  . Idiopathic chronic venous hypertension of both lower extremities with inflammation 08/01/2016  . Hyperlipidemia LDL goal <100 08/05/2014  . Chronic atrial fibrillation ([place]) 08/05/2014  . Essential hypertension 08/05/2014  . Diabetes mellitus with renal manifestations, controlled ([place]) 08/05/2014  . Diabetes mellitus with renal complications ([place]) 52/77/8242  . Diabetic polyneuropathy associated with type 2 diabetes mellitus ([place]) 07/23/2013  . CKD (chronic kidney disease), symptom management only 07/23/2013  . Impacted cerumen 07/23/2013  . Dementia  due to Parkinson's disease without behavioral disturbance ([place]) 07/23/2013  . A-fib ([place]) 01/22/2013  . RBBB 01/22/2013  . OSA (obstructive sleep apnea) 01/22/2013  . Prediabetes 07/30/2012  . Bradycardia 04/03/2012  . Parkinson disease ([place]) 04/03/2012  . HTN (hypertension) 04/03/2012  . Other and unspecified hyperlipidemia 04/03/2012  . CKD (chronic kidney disease) 04/03/2012    Palliative Care Assessment & Plan   HPI: 83 y.o. male  with past medical history of Parkinsons, HTN, and foot ulcers admitted on 10/03/2016 with AMS and AKI d/t obstruction. At baseline, pt lives independently at home with wife - walks independently, can perform all ADLs without assistance, drives. Daughter reports slight memory issues at baseline. In early September was admitted to hospital for pna and discharged to SNF for 10 days. Daughter reports intermittent confusion at baseline with "good and bad days". She feels he has become progressively weaker with less PO intake since SNF admission.   Foley catheter placed on admission to treat AKI. No previous history of obstruction. Plan is to d/c pt with foley in place. During admission, pt developed pneumonia during admission d/t aspiration. Pt also displaying some signs of delerium/acute encephalopathy. MRI to r/o stroke - negative.  Minimal PO intake, small sips taken periodically - failed swallow evaluation d/t high aspiration risk/lethargy. Albumin 1.7. Refuses feeding tube.  Palliative care consulted to establish goals of care and patient's failure to improve despite aggressive medical interventions.   Assessment: Met with patient's daughter and wife at bedside to f/u with yesterday's discussion. Family is tearful but understanding of poor prognosis. They report he has been minimally responsive to them - has not spoken to them today, but occasionally opens his eyes. They express thankfulness for the quality time they have had with them and are reassured that he  knows they are with him. Discussed d/c IV fluids and antibiotics. Family is agreeable. They want to proceed with plan for residential hospice placement. Their focus for him is comfort and quality of life.   Recommendations/Plan:  CSW consult for residential hospice  Full comfort care  0.4 mg Robiunl q4hr IV PRN oral secretions  Morphine 1 mg q2hr IV PRN moderate pain or dyspnea  Maintain foley catheter for comfort/obstruction  Continue oral care and wound care as needed for comfort  Goals of Care and Additional Recommendations:  Limitations on Scope of Treatment: Full Comfort Care, Minimize Medications, Initiate Comfort Feeding, No Artificial Feeding, No Diagnostics, No Glucose Monitoring, No IV Antibiotics, No IV Fluids and No Lab Draws  Code Status:  DNR  Prognosis:   < 2 weeks  Discharge Planning:  Hospice facility  Care plan was discussed with Dr. Wynelle Cleveland, bedside RN, social work  Thank you for allowing the Palliative Medicine Team to assist in the  care of this patient.   Time In: 09:00 Time Out: 10:00 Total Time 60 Prolonged Time Billed  no       Greater than 50%  of this time was spent counseling and coordinating care related to the above assessment and plan.  Juel Burrow, DNP, AGNP-C Palliative Medicine Team Team Phone # [phone]   Mariana Kaufman, [facility] Palliative Medicine
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
## 2016-10-13 LAB — CBC
HCT: 36.2 % — ABNORMAL LOW (ref 39.0–52.0)
HEMOGLOBIN: 11.6 g/dL — AB (ref 13.0–17.0)
MCH: 27.9 pg (ref 26.0–34.0)
MCHC: 32 g/dL (ref 30.0–36.0)
MCV: 87 fL (ref 78.0–100.0)
PLATELETS: 361 10*3/uL (ref 150–400)
RBC: 4.16 MIL/uL — ABNORMAL LOW (ref 4.22–5.81)
RDW: 14.7 % (ref 11.5–15.5)
WBC: 12 10*3/uL — ABNORMAL HIGH (ref 4.0–10.5)

## 2016-10-13 LAB — BASIC METABOLIC PANEL
Anion gap: 8 (ref 5–15)
BUN: 19 mg/dL (ref 6–20)
CALCIUM: 7.7 mg/dL — AB (ref 8.9–10.3)
CO2: 23 mmol/L (ref 22–32)
CREATININE: 0.85 mg/dL (ref 0.61–1.24)
Chloride: 107 mmol/L (ref 101–111)
GFR calc Af Amer: >60 mL/min (ref >60)
GLUCOSE: 82 mg/dL (ref 65–99)
Potassium: 4 mmol/L (ref 3.5–5.1)
Sodium: 138 mmol/L (ref 135–145)

## 2016-10-13 NOTE — Progress Notes (Signed)
Clinical Social Worker following patient and family for support and disposition plan. CSW met with family at patient bedside to make them aware that [facility] was able to offer them a hospice bed for tomorrow October 6th. Patient daughter stated she is unsure if the family would still want to do residential hospice due to patient being more alert and talkative. CSW paged MD to make her aware of family's questions on patients disposition plan. MD stated due to the patients changed in cognition she will run some labs and will contact CSW with updated disposition plan.   Rhea Pink, MSW,  [place]
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
## 2016-10-14 MED ORDER — SODIUM CHLORIDE 0.9 % IV SOLN
INTRAVENOUS | Status: DC
  Administered 2016-10-14 – 2016-10-15 (×3): via INTRAVENOUS

## 2016-10-14 MED ORDER — CARBIDOPA-LEVODOPA 10-100 MG PO TABS
2.0000 | ORAL_TABLET | Freq: Three times a day (TID) | ORAL | Status: DC
  Administered 2016-10-14 – 2016-10-19 (×7): 2 via ORAL
  Filled 2016-10-14 (×17): qty 2

## 2016-10-14 MED ORDER — SODIUM CHLORIDE 0.9 % IV BOLUS (SEPSIS)
1000.0000 mL | Freq: Once | INTRAVENOUS | Status: AC
  Administered 2016-10-14: 1000 mL via INTRAVENOUS
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
## 2016-10-15 LAB — CBC
HCT: 35.4 % — ABNORMAL LOW (ref 39.0–52.0)
Hemoglobin: 11.3 g/dL — ABNORMAL LOW (ref 13.0–17.0)
MCH: 28 pg (ref 26.0–34.0)
MCHC: 31.9 g/dL (ref 30.0–36.0)
MCV: 87.8 fL (ref 78.0–100.0)
PLATELETS: 332 10*3/uL (ref 150–400)
RBC: 4.03 MIL/uL — ABNORMAL LOW (ref 4.22–5.81)
RDW: 15.2 % (ref 11.5–15.5)
WBC: 11.4 10*3/uL — ABNORMAL HIGH (ref 4.0–10.5)

## 2016-10-15 LAB — BASIC METABOLIC PANEL
Anion gap: 8 (ref 5–15)
BUN: 18 mg/dL (ref 6–20)
CALCIUM: 7.5 mg/dL — AB (ref 8.9–10.3)
CO2: 22 mmol/L (ref 22–32)
CREATININE: 0.79 mg/dL (ref 0.61–1.24)
Chloride: 109 mmol/L (ref 101–111)
GFR calc non Af Amer: >60 mL/min (ref >60)
Glucose, Bld: 58 mg/dL — ABNORMAL LOW (ref 65–99)
Potassium: 4.1 mmol/L (ref 3.5–5.1)
SODIUM: 139 mmol/L (ref 135–145)

## 2016-10-15 MED ORDER — ENSURE ENLIVE PO LIQD
237.0000 mL | Freq: Two times a day (BID) | ORAL | Status: DC
  Administered 2016-10-16 – 2016-10-19 (×5): 237 mL via ORAL

## 2016-10-15 MED ORDER — PRO-STAT SUGAR FREE PO LIQD
30.0000 mL | Freq: Two times a day (BID) | ORAL | Status: DC
  Administered 2016-10-19: 30 mL via ORAL
  Filled 2016-10-15: qty 30

## 2016-10-15 NOTE — Progress Notes (Signed)
Tried to feed him with his breakfast,patient keeps his lips tightly closed.No breakfast intake at this time.
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
## 2016-10-16 DIAGNOSIS — F0280 Dementia in other diseases classified elsewhere without behavioral disturbance: Secondary | ICD-10-CM

## 2016-10-16 LAB — BASIC METABOLIC PANEL
ANION GAP: 10 (ref 5–15)
BUN: 17 mg/dL (ref 6–20)
CALCIUM: 7.4 mg/dL — AB (ref 8.9–10.3)
CHLORIDE: 111 mmol/L (ref 101–111)
CO2: 19 mmol/L — AB (ref 22–32)
CREATININE: 0.82 mg/dL (ref 0.61–1.24)
GFR calc non Af Amer: >60 mL/min (ref >60)
Glucose, Bld: 57 mg/dL — ABNORMAL LOW (ref 65–99)
Potassium: 3.6 mmol/L (ref 3.5–5.1)
SODIUM: 140 mmol/L (ref 135–145)

## 2016-10-16 LAB — CBC
HCT: 33.7 % — ABNORMAL LOW (ref 39.0–52.0)
Hemoglobin: 10.6 g/dL — ABNORMAL LOW (ref 13.0–17.0)
MCH: 28 pg (ref 26.0–34.0)
MCHC: 31.5 g/dL (ref 30.0–36.0)
MCV: 88.9 fL (ref 78.0–100.0)
Platelets: 286 10*3/uL (ref 150–400)
RBC: 3.79 MIL/uL — ABNORMAL LOW (ref 4.22–5.81)
RDW: 15.4 % (ref 11.5–15.5)
WBC: 9.1 10*3/uL (ref 4.0–10.5)

## 2016-10-16 MED ORDER — MORPHINE SULFATE (PF) 4 MG/ML IV SOLN
1.0000 mg | INTRAVENOUS | Status: DC | PRN
  Administered 2016-10-19: 1 mg via INTRAVENOUS
  Filled 2016-10-16: qty 1

## 2016-10-16 MED ORDER — MORPHINE SULFATE (PF) 2 MG/ML IV SOLN: 1.0000 mg | INTRAVENOUS | Status: DC | PRN

## 2016-10-16 MED ORDER — GLYCOPYRROLATE 0.2 MG/ML IJ SOLN
0.2000 mg | INTRAMUSCULAR | Status: DC
  Administered 2016-10-16 – 2016-10-17 (×5): 0.2 mg via INTRAVENOUS
  Filled 2016-10-16 (×7): qty 1

## 2016-10-16 MED ORDER — GLYCOPYRROLATE 0.2 MG/ML IJ SOLN
0.4000 mg | Freq: Once | INTRAMUSCULAR | Status: AC
  Administered 2016-10-16: 0.4 mg via INTRAVENOUS
  Filled 2016-10-16: qty 2
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
## 2016-10-16 NOTE — Progress Notes (Signed)
Daily Progress Note   Patient Name: Ryan Cunningham       Date: 10/16/2016 DOB: Jul 18, 1927  Age: 83 y.o. MRN#: [id number] Attending Physician: Ryan Odea, MD Primary Care Physician: Ryan Battles, MD Admit Date: 10/03/2016  Reason for Consultation/Follow-up: Establishing goals of care  Subjective: Pt lying in bed, initially only responsive to pain. Once family arrived, periodically opens eyes. Does not follow commands. Withdraws.   Length of Stay: 13  Current Medications: Scheduled Meds:  . carbidopa-levodopa  2 tablet Oral TID  . chlorhexidine  15 mL Mouth Rinse BID  . feeding supplement (ENSURE ENLIVE)  237 mL Oral BID BM  . feeding supplement (PRO-STAT SUGAR FREE 64)  30 mL Oral BID  . glycopyrrolate  0.2 mg Intravenous Q4H  . glycopyrrolate  0.4 mg Intravenous Once  . mouth rinse  15 mL Mouth Rinse q12n4p    Continuous Infusions: . sodium chloride 125 mL/hr at 10/15/16 1652    PRN Meds: acetaminophen **OR** acetaminophen, albuterol, morphine injection, ondansetron **OR** ondansetron (ZOFRAN) IV  Physical Exam  Constitutional: He appears lethargic.  Tearful, appears frightened at times.   HENT:  Oral secretions - gurgling  Cardiovascular: Bradycardia present.   Cardiac pauses  Pulmonary/Chest:  Periods of cheyne-stokes  Musculoskeletal:       Right forearm: He exhibits edema.       Left forearm: He exhibits edema.  Neurological: He appears lethargic. He is disoriented.  Skin: Skin is warm and dry. He is not diaphoretic.            Vital Signs: BP (!) 168/58 (BP Location: Left Arm)   Pulse (!) 38   Temp 98.8 F (37.1 C) (Axillary)   Resp 15   Ht 6' 2"  (1.88 m)   Wt 78 kg (171 lb 15.3 oz)   SpO2 99%   BMI 22.08 kg/m  SpO2: SpO2: 99 % O2 Device: O2 Device: Not  Delivered O2 Flow Rate:    Intake/output summary:  Intake/Output Summary (Last 24 hours) at 10/16/16 1346 Last data filed at 10/16/16 1000  Gross per 24 hour  Intake              250 ml  Output              550 ml  Net             -300 ml   LBM: Last BM Date: 10/12/16 Baseline Weight: Weight: 84.8 kg (187 lb) Most recent weight: Weight: 78 kg (171 lb 15.3 oz)       Palliative Assessment/Data:20%    Flowsheet Rows     Most Recent Value  Intake Tab  Referral Department  Hospitalist  Unit at Time of Referral  Cardiac/Telemetry Unit  Palliative Care Primary Diagnosis  Neurology  Date Notified  10/10/16  Palliative Care Type  New Palliative care  Reason for referral  Clarify Goals of Care  Date of Admission  10/03/16  Date first seen by Palliative Care  10/11/16  # of days Palliative referral response time  1 Day(s)  # of days IP prior to Palliative referral  7  Clinical Assessment  Palliative Performance Scale Score  20%  Psychosocial &  Spiritual Assessment  Palliative Care Outcomes      Patient Active Problem List   Diagnosis Date Noted  . Advanced care planning/counseling discussion   . Terminal care   . Aspiration pneumonia ([place])   . Goals of care, counseling/discussion   . Palliative care encounter   . Acute renal failure (ARF) ([place]) 10/03/2016  . Hyperkalemia 10/03/2016  . Sepsis ([place]) 10/03/2016  . Acute encephalopathy 09/18/2016  . Anemia 09/18/2016  . Hypervolemia 09/18/2016  . Elevated troponin 09/18/2016  . Foot ulcer ([place]) 09/18/2016  . CAP (community acquired pneumonia) 09/18/2016  . Diabetic ulcer of left midfoot associated with type 2 diabetes mellitus, limited to breakdown of skin ([place]) 08/01/2016  . Idiopathic chronic venous hypertension of both lower extremities with inflammation 08/01/2016  . Hyperlipidemia LDL goal <100 08/05/2014  . Chronic atrial fibrillation ([place]) 08/05/2014  . Essential hypertension 08/05/2014  . Diabetes mellitus with  renal manifestations, controlled ([place]) 08/05/2014  . Diabetes mellitus with renal complications ([place]) 53/66/4403  . Diabetic polyneuropathy associated with type 2 diabetes mellitus ([place]) 07/23/2013  . CKD (chronic kidney disease), symptom management only 07/23/2013  . Impacted cerumen 07/23/2013  . Dementia due to Parkinson's disease without behavioral disturbance ([place]) 07/23/2013  . A-fib ([place]) 01/22/2013  . RBBB 01/22/2013  . OSA (obstructive sleep apnea) 01/22/2013  . Prediabetes 07/30/2012  . Bradycardia 04/03/2012  . Parkinson disease ([place]) 04/03/2012  . HTN (hypertension) 04/03/2012  . Other and unspecified hyperlipidemia 04/03/2012  . CKD (chronic kidney disease) 04/03/2012    Palliative Care Assessment & Plan   HPI: 83 y.o.malewith past medical history of Parkinsons, HTN, and foot ulcersadmitted on 9/25/2018with AMS and AKI d/t obstruction. At baseline, pt lives independently at home with wife - walks independently, can perform all ADLs without assistance, drives. Daughter reports slight memory issues at baseline. In early September was admitted to hospital for pna and discharged to SNF for 10 days. Daughter reports intermittent confusion at baseline with "good and bad days". She feels he has become progressively weaker with less PO intake since SNF admission.   Foley catheter placed on admission to treat AKI. No previous history of obstruction. Plan is to d/c pt with foley in place. During admission, pt developed pneumonia during admission d/t aspiration. Pt also displaying some signs of delerium/acute encephalopathy. MRI to r/o stroke - negative.  Minimal PO intake, small sips taken periodically - failed swallow evaluation d/t high aspiration risk/lethargy. Albumin 1.7. Refuses feeding tube.  Palliative care consulted to establish goals of care and patient's failure to improve despite aggressive medical interventions.   Assessment: Ryan Rhodes, NP and Ryan Sill,  NP met with family - daughter and wife - at patient's bedside. Discussed weekend events of patient waking up periodically with small amounts of oral intake. Family was hopeful that he was improving and so asked that hospice transfer be delayed. Today, we discussed hypoglycemia and cardiac pauses on telemetry. Discussed patient's poor prognosis and how we may continue to experience "good and bad days".   During our visit, patient opened his eyes and when asked how he was feeling he stated "sick" and became tearful. Shortly after, patient displayed cheyne-stokes breathing pattern with weak, thready pulses. Patient recovered, but family understands he is nearing end of life. They confirm they want to focus on his comfort and dignity at this point. We discussed likelihood of hospital death and prognosis of hours-days.   Recommendations/Plan:  Comfort care  [place] now and scheduled  Morphine PRN dyspnea and  pain  Comfort feeding  Goals of Care and Additional Recommendations:  Limitations on Scope of Treatment: Full Comfort Care, Minimize Medications, Initiate Comfort Feeding, No Glucose Monitoring, No IV Antibiotics, No IV Fluids and No Lab Draws  Code Status:  DNR  Prognosis:   Hours - Days  Discharge Planning:  Anticipated Hospital Death  Care plan was discussed with family, bedside RN, Dr. Wynelle Cleveland  Thank you for allowing the Palliative Medicine Team to assist in the care of this patient.   Time In: 13:00 Time Out: 14:00 Total Time 60 Prolonged Time Billed No      Greater than 50%  of this time was spent counseling and coordinating care related to the above assessment and plan.  Juel Burrow, DNP, AGNP-C Palliative Medicine Team Team Phone # [phone]   Ryan Sill, NP Palliative Medicine Team Pager # [phone] (M-F 8a-5p) Team Phone # [phone] (Nights/Weekends)

## 2016-10-16 NOTE — Care Management Important Message (Signed)
Important Message  Patient Details  Name: Ryan Cunningham MRN: [id number] Date of Birth: [birth year]   Medicare Important Message Given:  Yes    Kyla Balzarine 10/16/2016, 8:13 AM

## 2016-10-17 MED ORDER — ACETAMINOPHEN 325 MG PO TABS
650.0000 mg | ORAL_TABLET | Freq: Three times a day (TID) | ORAL | Status: DC
  Administered 2016-10-17: 650 mg via ORAL
  Filled 2016-10-17 (×3): qty 2

## 2016-10-17 MED ORDER — GLYCOPYRROLATE 0.2 MG/ML IJ SOLN
0.2000 mg | Freq: Four times a day (QID) | INTRAMUSCULAR | Status: DC
  Administered 2016-10-18 (×2): 0.2 mg via INTRAVENOUS
  Filled 2016-10-17 (×5): qty 1
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
## 2016-10-17 NOTE — Progress Notes (Signed)
CSW received consult that pt now appropriate for transfer to residential hospice- CSW informed Ryan Cunningham is first choice- CSW called Human resources officer to request pt be reviewed for admission  No beds today- CSW will continue to follow  Burna Sis, LCSW Clinical Social Worker [phone]

## 2016-10-17 NOTE — Progress Notes (Addendum)
Daily Progress Note   Patient Name: Ryan Cunningham       Date: 10/17/2016 DOB: 1927/06/18  Age: 83 y.o. MRN#: [id number] Attending Physician: Debbe Odea, MD Primary Care Physician: Leanna Battles, MD Admit Date: 10/03/2016  Reason for Consultation/Follow-up: Establishing goals of care and Hospice Evaluation  Subjective: Pt lying awake in bed, talking to family, asks for tylenol to prevent pain - denies current pain. Watching TV. Taking small bites of food periodically. Drifts in and out of sleep. Not oriented to time/situation.   Length of Stay: 14  Current Medications: Scheduled Meds:  . acetaminophen  650 mg Oral TID  . carbidopa-levodopa  2 tablet Oral TID  . chlorhexidine  15 mL Mouth Rinse BID  . feeding supplement (ENSURE ENLIVE)  237 mL Oral BID BM  . feeding supplement (PRO-STAT SUGAR FREE 64)  30 mL Oral BID  . glycopyrrolate  0.2 mg Intravenous Q6H  . mouth rinse  15 mL Mouth Rinse q12n4p    Continuous Infusions: . sodium chloride 10 mL/hr at 10/16/16 1345    PRN Meds: albuterol, morphine injection, ondansetron **OR** ondansetron (ZOFRAN) IV  Physical Exam  Constitutional: No distress.  Cardiovascular:  bradycardic  Pulmonary/Chest: Effort normal. No respiratory distress.  Musculoskeletal: He exhibits edema.  Bilateral upper extremities  Neurological: He is alert.  disoriented  Skin: Skin is warm and dry. He is not diaphoretic.            Vital Signs: BP (!) 142/48 (BP Location: Right Leg)   Pulse (!) 38   Temp 98 F (36.7 C) (Axillary)   Resp 15   Ht _0  (1.88 m)   Wt 78 kg (171 lb 15.3 oz)   SpO2 99%   BMI 22.08 kg/m  SpO2: SpO2: 99 % O2 Device: O2 Device: Not Delivered O2 Flow Rate:    Intake/output summary:  Intake/Output Summary (Last 24 hours) at  10/17/16 1528 Last data filed at 10/17/16 1437  Gross per 24 hour  Intake                0 ml  Output              400 ml  Net             -400 ml   LBM: Last BM Date: 10/12/16 Baseline Weight: Weight: 84.8 kg (187 lb) Most recent weight: Weight: 78 kg (171 lb 15.3 oz)       Palliative Assessment/Data: 20%    Flowsheet Rows     Most Recent Value  Intake Tab  Referral Department  Hospitalist  Unit at Time of Referral  Cardiac/Telemetry Unit  Palliative Care Primary Diagnosis  Neurology  Date Notified  10/10/16  Palliative Care Type  New Palliative care  Reason for referral  Clarify Goals of Care  Date of Admission  10/03/16  Date first seen by Palliative Care  10/11/16  # of days Palliative referral response time  1 Day(s)  # of days IP prior to Palliative referral  7  Clinical Assessment  Palliative Performance Scale Score  20%  Psychosocial & Spiritual Assessment  Palliative Care Outcomes      Patient Active Problem List  Diagnosis Date Noted  . Advanced care planning/counseling discussion   . Terminal care   . Aspiration pneumonia ([place])   . Goals of care, counseling/discussion   . Palliative care encounter   . Acute renal failure (ARF) ([place]) 10/03/2016  . Hyperkalemia 10/03/2016  . Sepsis ([place]) 10/03/2016  . Acute encephalopathy 09/18/2016  . Anemia 09/18/2016  . Hypervolemia 09/18/2016  . Elevated troponin 09/18/2016  . Foot ulcer ([place]) 09/18/2016  . CAP (community acquired pneumonia) 09/18/2016  . Diabetic ulcer of left midfoot associated with type 2 diabetes mellitus, limited to breakdown of skin ([place]) 08/01/2016  . Idiopathic chronic venous hypertension of both lower extremities with inflammation 08/01/2016  . Hyperlipidemia LDL goal <100 08/05/2014  . Chronic atrial fibrillation ([place]) 08/05/2014  . Essential hypertension 08/05/2014  . Diabetes mellitus with renal manifestations, controlled ([place]) 08/05/2014  . Diabetes mellitus with renal complications  ([place]) 02/54/2706  . Diabetic polyneuropathy associated with type 2 diabetes mellitus ([place]) 07/23/2013  . CKD (chronic kidney disease), symptom management only 07/23/2013  . Impacted cerumen 07/23/2013  . Dementia due to Parkinson's disease without behavioral disturbance ([place]) 07/23/2013  . A-fib ([place]) 01/22/2013  . RBBB 01/22/2013  . OSA (obstructive sleep apnea) 01/22/2013  . Prediabetes 07/30/2012  . Bradycardia 04/03/2012  . Parkinson disease ([place]) 04/03/2012  . HTN (hypertension) 04/03/2012  . Other and unspecified hyperlipidemia 04/03/2012  . CKD (chronic kidney disease) 04/03/2012    Palliative Care Assessment & Plan   HPI: 83 y.o.malewith past medical history of Parkinsons, HTN, and foot ulcersadmitted on 9/25/2018with AMS and AKI d/t obstruction. At baseline, pt lives independently at home with wife - walks independently, can perform all ADLs without assistance, drives. Daughter reports slight memory issues at baseline. In early September was admitted to hospital for pna and discharged to SNF for 10 days. Daughter reports intermittent confusion at baseline with "good and bad days". She feels he has become progressively weaker with less PO intake since SNF admission.   Foley catheter placed on admission to treat AKI. No previous history of obstruction. Plan is to d/c pt with foley in place. During admission, pt developed pneumonia during admission d/t aspiration. Pt also displaying some signs of delerium/acute encephalopathy. MRI to r/o stroke - negative.  Minimal PO intake, small sips taken periodically- failed swallow evaluation d/t high aspiration risk/lethargy. Albumin 1.7. Refuses feeding tube.  Palliative care consulted to establish goals of care and patient's failure to improve despite aggressive medical interventions.   Assessment: Kathie Rhodes, NP and Vinie Sill, NP met with family - daughter, wife, and niece - at pt's bedside. They report patient has had a  "good day" today. He has been more alert than yesterday, eating small bites and drinking. We encouraged the family to enjoy the "good days" and cherish the moments when we is awake and interactive with them and discussed how these moments may decrease as he continues to decline. We discussed Hospice referral again - they are fearful of this because "it feels so final". We discussed the type of care he wold get in a Hospice facility would be the same type of care he has received in the hospital for the past several days - only in an environment more equipped to provide "comfort care". They were tearful as they discussed his prognosis and continued to state how they wished he could go home, but realize this is not realistic. They are agreeable to residential hospice referral.   Recommendations/Plan:  Social work referral for residential hospice -  family hopeful for [facility]  Continue comfort care  Scheduled tylenol for MSK pain  Morphine prn dyspnea and pain uncontrolled by tylenol  Scheduled robinul for terminal secretions  Comfort feeding as tolerated  Goals of Care and Additional Recommendations:  Limitations on Scope of Treatment: Full Comfort Care, Minimize Medications, Initiate Comfort Feeding, No Artificial Feeding, No Diagnostics, No Glucose Monitoring, No IV Antibiotics and No Lab Draws  Code Status:  DNR  Prognosis:   Hours - Days  Discharge Planning:  Hospice facility  Care plan was discussed with family and bedside RN  Thank you for allowing the Palliative Medicine Team to assist in the care of this patient.   Time In: 14:00 Time Out: 15:00 Total Time 60 minutes Prolonged Time Billed No       Greater than 50%  of this time was spent counseling and coordinating care related to the above assessment and plan.  Juel Burrow, DNP, AGNP-C Palliative Medicine Team Team Phone # [phone]   Vinie Sill, NP Palliative Medicine Team Pager # [phone]  (M-F 8a-5p) Team Phone # [phone] (Nights/Weekends)

## 2016-10-18 MED ORDER — ACETAMINOPHEN 325 MG PO TABS: 650.0000 mg | ORAL_TABLET | Freq: Three times a day (TID) | ORAL | Status: AC

## 2016-10-18 MED ORDER — CHLORHEXIDINE GLUCONATE 0.12 % MT SOLN: 15.0000 mL | Freq: Two times a day (BID) | OROMUCOSAL | 0 refills | Status: AC

## 2016-10-18 MED ORDER — GLYCOPYRROLATE 0.2 MG/ML IJ SOLN: 0.1000 mg | Freq: Three times a day (TID) | INTRAMUSCULAR | Status: AC

## 2016-10-18 MED ORDER — GLYCOPYRROLATE 0.2 MG/ML IJ SOLN
0.4000 mg | INTRAMUSCULAR | Status: DC
  Administered 2016-10-18 – 2016-10-19 (×5): 0.4 mg via INTRAVENOUS
  Filled 2016-10-18 (×7): qty 2

## 2016-10-18 MED ORDER — MORPHINE SULFATE 20 MG/5ML PO SOLN: 2.5000 mg | ORAL | 0 refills | Status: AC | PRN

## 2016-10-18 NOTE — Progress Notes (Signed)
Palliative Medicine RN Note: Rec'd call from pt's daughter Prentiss Bells. She reports that family has talked further and now would like pt to go to [facility] in [place]. Laurie's number is [phone]; left message for SW [place].  Margret Chance Keymari Sato, RN, BSN, [facility] 10/18/2016 9:30 AM Cell [phone] 8:00-4:00 Monday-Friday Office [phone]

## 2016-10-18 NOTE — Progress Notes (Signed)
CSW received voicemail from palliative- made referral to [facility] and they will follow up  I am no longer the covering CSW for this pt- unit CSW updated  This CSW signing off  Burna Sis, LCSW Clinical Social Worker [phone]

## 2016-10-18 NOTE — Progress Notes (Signed)
Daily Progress Note   Patient Name: Ryan Cunningham       Date: 10/18/2016 DOB: 01/06/1928  Age: 83 y.o. MRN#: [id number] Attending Physician: Ryan Fire, MD Primary Care Physician: Ryan Battles, MD Admit Date: 10/03/2016  Reason for Consultation/Follow-up: Hospice Evaluation and Terminal Care  Subjective: Awake in bed, talking to family. Taking small sips of ensure. Not oriented to situation.   Length of Stay: 15  Current Medications: Scheduled Meds:  . acetaminophen  650 mg Oral TID  . carbidopa-levodopa  2 tablet Oral TID  . chlorhexidine  15 mL Mouth Rinse BID  . feeding supplement (ENSURE ENLIVE)  237 mL Oral BID BM  . feeding supplement (PRO-STAT SUGAR FREE 64)  30 mL Oral BID  . glycopyrrolate  0.4 mg Intravenous Q4H  . mouth rinse  15 mL Mouth Rinse q12n4p    Continuous Infusions: . sodium chloride 10 mL/hr at 10/16/16 1345    PRN Meds: albuterol, morphine injection, ondansetron **OR** ondansetron (ZOFRAN) IV  Physical Exam  Constitutional: No distress.  Pulmonary/Chest: Effort normal. No respiratory distress.  Musculoskeletal:  Bilateral upper extremity edema  Neurological: He is alert.  Disoriented to place, situation  Skin: Skin is warm and dry. He is not diaphoretic.            Vital Signs: BP 118/72 (BP Location: Right Leg)   Pulse (!) 55   Temp 98 F (36.7 C) (Axillary)   Resp 18   Ht _0  (1.88 m)   Wt 78.1 kg (172 lb 2.9 oz)   SpO2 99%   BMI 22.11 kg/m  SpO2: SpO2: 99 % O2 Device: O2 Device: Not Delivered O2 Flow Rate:    Intake/output summary:  Intake/Output Summary (Last 24 hours) at 10/18/16 1342 Last data filed at 10/18/16 0900  Gross per 24 hour  Intake                0 ml  Output              750 ml  Net             -750 ml    LBM: Last BM Date: 10/12/16 Baseline Weight: Weight: 84.8 kg (187 lb) Most recent weight: Weight: 78.1 kg (172 lb 2.9 oz)       Palliative Assessment/Data: 20%    Flowsheet Rows     Most Recent Value  Intake Tab  Referral Department  Hospitalist  Unit at Time of Referral  Cardiac/Telemetry Unit  Palliative Care Primary Diagnosis  Neurology  Date Notified  10/10/16  Palliative Care Type  New Palliative care  Reason for referral  Clarify Goals of Care  Date of Admission  10/03/16  Date first seen by Palliative Care  10/11/16  # of days Palliative referral response time  1 Day(s)  # of days IP prior to Palliative referral  7  Clinical Assessment  Palliative Performance Scale Score  20%  Psychosocial & Spiritual Assessment  Palliative Care Outcomes      Patient Active Problem List   Diagnosis Date Noted  . Advanced care planning/counseling discussion   . Terminal care   . Aspiration pneumonia ([place])   . Goals  of care, counseling/discussion   . Palliative care encounter   . Acute renal failure (ARF) ([place]) 10/03/2016  . Hyperkalemia 10/03/2016  . Sepsis ([place]) 10/03/2016  . Acute encephalopathy 09/18/2016  . Anemia 09/18/2016  . Hypervolemia 09/18/2016  . Elevated troponin 09/18/2016  . Foot ulcer ([place]) 09/18/2016  . CAP (community acquired pneumonia) 09/18/2016  . Diabetic ulcer of left midfoot associated with type 2 diabetes mellitus, limited to breakdown of skin ([place]) 08/01/2016  . Idiopathic chronic venous hypertension of both lower extremities with inflammation 08/01/2016  . Hyperlipidemia LDL goal <100 08/05/2014  . Chronic atrial fibrillation ([place]) 08/05/2014  . Essential hypertension 08/05/2014  . Diabetes mellitus with renal manifestations, controlled ([place]) 08/05/2014  . Diabetes mellitus with renal complications ([place]) 77/82/4235  . Diabetic polyneuropathy associated with type 2 diabetes mellitus ([place]) 07/23/2013  . CKD (chronic kidney disease), symptom  management only 07/23/2013  . Impacted cerumen 07/23/2013  . Dementia due to Parkinson's disease without behavioral disturbance ([place]) 07/23/2013  . A-fib ([place]) 01/22/2013  . RBBB 01/22/2013  . OSA (obstructive sleep apnea) 01/22/2013  . Prediabetes 07/30/2012  . Bradycardia 04/03/2012  . Parkinson disease ([place]) 04/03/2012  . HTN (hypertension) 04/03/2012  . Other and unspecified hyperlipidemia 04/03/2012  . CKD (chronic kidney disease) 04/03/2012    Palliative Care Assessment & Plan   HPI: 83 y.o.malewith past medical history of Parkinsons, HTN, and foot ulcersadmitted on 9/25/2018with AMS and AKI d/t obstruction. At baseline, pt lives independently at home with wife - walks independently, can perform all ADLs without assistance, drives. Daughter reports slight memory issues at baseline. In early September was admitted to hospital for pna and discharged to SNF for 10 days. Daughter reports intermittent confusion at baseline with "good and bad days". She feels he has become progressively weaker with less PO intake since SNF admission.   Foley catheter placed on admission to treat AKI. No previous history of obstruction. Plan is to d/c pt with foley in place. During admission, pt developed pneumonia during admission d/t aspiration. Pt also displaying some signs of delerium/acute encephalopathy. MRI to r/o stroke - negative.  Minimal PO intake, small sips taken periodically- failed swallow evaluation d/t high aspiration risk/lethargy. Albumin 1.7. Refuses feeding tube.  Palliative care consulted to establish goals of care and patient's failure to improve despite aggressive medical interventions.   Assessment: Ryan Rhodes, NP and Ryan Sill, NP met with family - daughter, wife, and niece - at pt's bedside. They are enjoying moments with Mr. Ryan Cunningham while he is awake and laughing. They seem to be more accepting of residential hospice today and are awaiting placement. Therapeutic  listening and emotional support provided.   Recommendations/Plan:  Residential Hospice - family hopeful for [facility] - family volunteers there regularly  Continue comfort care  Scheduled tylenol for MSK pain  Morphine prn dyspnea and pain uncontrolled by tylenol  Scheduled robinul for terminal secretions  Comfort feeding as tolerated  Goals of Care and Additional Recommendations:  Limitations on Scope of Treatment: Full Comfort Care, Minimize Medications, Initiate Comfort Feeding, No Artificial Feeding, No Diagnostics, No Glucose Monitoring, No IV Antibiotics and No Lab Draws  Code Status:  DNR  Prognosis:   Hours - Days  Discharge Planning:  Hospice facility  Care plan was discussed with social work, family  Thank you for allowing the Palliative Medicine Team to assist in the care of this patient.   Time In: 13:00 Time Out: 13:40 Total Time 40 minutes Prolonged Time Billed  no       Greater than 50%  of this time was spent counseling and coordinating care related to the above assessment and plan.  Juel Burrow, DNP, AGNP-C Palliative Medicine Team Team Phone # [phone]   Ryan Sill, NP Palliative Medicine Team Pager # [phone] (M-F 8a-5p) Team Phone # [phone] (Nights/Weekends)

## 2016-10-18 NOTE — Clinical Social Work Note (Signed)
CSW continuing to work with family regarding Hospice placement for patient. [facility] and [facility] had no beds today and family's alternate choice, [facility] (per visit with family earlier today) requested to come out between 5-5:30 pm this evening to talk with family. CSW informed by daughter (during subsequent visit with family this afternoon) that [facility] should have a bed tomorrow. Follow-up call made to [facility] and per [facility], they have patients near death and may a bed(s) on May 17, 2022. Delray Alt will contact CSW on 05/17/2022 morning after their meeting. Family updated and if no bed available CSW will f/u with [facility] and if no beds, [facility] will be contacted.  Genelle Bal, MSW, LCSW Licensed Clinical Social Worker Clinical Social Work Department [employer] [phone]

## 2016-10-18 NOTE — Progress Notes (Addendum)
Palliative Medicine RN Note:  Rec'd message from PMT NP Yong Channel requesting follow up on hospice referral. Family member Dot is a Agricultural consultant at [facility], and that is the family's first choice.  I spoke with Margie in the intake dept at [facility]. They expect to have a bed tomorrow, but they cannot guarantee it.   There is significant concern that pushing the family to go to a facility that they don't want could cause reversal of goals of care. Family does agree to [facility], and [facility] would be second choice only. While PMT providers are aware of the concern for LOS, there is a risk that pushing a plan they do not want will not go well.   PMT RN will f/u tomorrow and has update pt's SW Erie Noe and his daughter Jacki Cones.  Margret Chance Umi Mainor, RN, BSN, [facility] 10/18/2016 1:45 PM Cell [phone] 8:00-4:00 Monday-Friday Office [phone]

## 2016-10-18 NOTE — Progress Notes (Addendum)
PROGRESS NOTE    Ryan NARDOZZI  ZOX:[id number] DOB: 1927-02-27 DOA: 10/03/2016 PCP: Jarome Matin, MD   Brief Narrative: 83 [place] with a past medical history significant for Parkinson's disease, HTN, and foot ulcerswho presents with lethargy, altered mental status. Found to have urinary obstruction in ER and Foley placed. According to HPI, the patient lives at home with his wife and about two weeks prior to this admission, he became acutely confused, was admitted to the hospital and diagnozed with pneumonia, treated with Levaquin to Ceftin and discharged to SNF 10 days ago where he has been intermittently confused.   In the hospital patient was found to have aspiration pneumonia.  He became unresponsive subsequently. After palliative care meeting 10/3, family elected to transition to comfort care. Now waiting for residential hospice.  Assessment & Plan:    Acute renal failure likely obstructive uropathy   Parkinson disease (HCC)   Dementia due to Parkinson's disease without behavioral disturbance (HCC)   Essential hypertension   Acute encephalopathy   Foot ulcer (HCC)   Hyperkalemia   Sepsis (HCC) ruled out   Aspiration pneumonia (HCC)   Goals of care, counseling/discussion   Palliative care encounter   Advanced care planning/counseling discussion   Terminal care   Stage 2 right heel pressure injury, poa   Right inner foot with dry callous, poa   Left plantar foot with full thickness wound, poa.  Patient is now in comfort care measures. He is DNR/DNI. He was only alert but not oriented and not following commands this morning. Palliative care is following. Discussed with the social worker regarding residential hospice. Provide supportive and comfort care only.  DVT prophylaxis: comfort care Code Status:DNR Family Communication: No family at bedside Disposition Plan: Waiting bed for residential hospice    Consultants:   Neurology  Palliative  care  Procedures: None Antimicrobials: None currently  Subjective: Seen and examined at bedside. Patient is alert but not oriented and not following commands this morning.  Objective: Vitals:   10/16/16 2104 10/17/16 0900 10/17/16 2143 10/18/16 0555  BP: (!) 143/49 (!) 142/48 128/66 118/72  Pulse: (!) 43 (!) 38 (!) 46 (!) 55  Resp: Temp: 98.4 F (36.9 C) 98 F (36.7 C) 98.4 F (36.9 C) 98 F (36.7 C)  TempSrc: Axillary Axillary Axillary Axillary  SpO2: 99% 99% 98% 99%  Weight:   78.1 kg (172 lb 2.9 oz)   Height:        Intake/Output Summary (Last 24 hours) at 10/18/16 1445 Last data filed at 10/18/16 0900  Gross per 24 hour  Intake                0 ml  Output              350 ml  Net             -350 ml   Filed Weights   10/15/16 0348 10/15/16 2100 10/17/16 2143  Weight: 78 kg (171 lb 15.3 oz) 78 kg (171 lb 15.3 oz) 78.1 kg (172 lb 2.9 oz)    Examination:  General exam: Ill-looking lethargic male lying in bed Respiratory system: Clear to auscultation. Respiratory effort normal. No wheezing or crackle Cardiovascular system: S1 & S2 heard, RRR.  No pedal edema. Gastrointestinal system: Abdomen is nondistended, soft and nontender. Normal bowel sounds heard. Central nervous system: Not oriented Skin: No rashes, lesions or ulcers    Data Reviewed: I have personally reviewed  following labs and imaging studies  CBC:  Recent Labs Lab 10/13/16 1748 10/15/16 0942 10/16/16 0300  WBC 12.0* 11.4* 9.1  HGB 11.6* 11.3* 10.6*  HCT 36.2* 35.4* 33.7*  MCV 87.0 87.8 88.9  PLT 361 332 286   Basic Metabolic Panel:  Recent Labs Lab 10/13/16 1748 10/15/16 0721 10/16/16 0300  NA 138 139 140  K 4.0 4.1 3.6  CL 107 109 111  CO2 23 22 19*  GLUCOSE 82 58* 57*  BUN CREATININE 0.85 0.79 0.82  CALCIUM 7.7* 7.5* 7.4*   GFR: Estimated Creatinine Clearance: 70.1 mL/min (by C-G formula based on SCr of 0.82 mg/dL). Liver Function Tests: No results  for input(s): AST, ALT, ALKPHOS, BILITOT, PROT, ALBUMIN in the last 168 hours. No results for input(s): LIPASE, AMYLASE in the last 168 hours. No results for input(s): AMMONIA in the last 168 hours. Coagulation Profile: No results for input(s): INR, PROTIME in the last 168 hours. Cardiac Enzymes: No results for input(s): CKTOTAL, CKMB, CKMBINDEX, TROPONINI in the last 168 hours. BNP (last 3 results) No results for input(s): PROBNP in the last 8760 hours. HbA1C: No results for input(s): HGBA1C in the last 72 hours. CBG: No results for input(s): GLUCAP in the last 168 hours. Lipid Profile: No results for input(s): CHOL, HDL, LDLCALC, TRIG, CHOLHDL, LDLDIRECT in the last 72 hours. Thyroid Function Tests: No results for input(s): TSH, T4TOTAL, FREET4, T3FREE, THYROIDAB in the last 72 hours. Anemia Panel: No results for input(s): VITAMINB12, FOLATE, FERRITIN, TIBC, IRON, RETICCTPCT in the last 72 hours. Sepsis Labs: No results for input(s): PROCALCITON, LATICACIDVEN in the last 168 hours.  No results found for this or any previous visit (from the past 240 hour(s)).       Radiology Studies: No results found.      Scheduled Meds: . acetaminophen  650 mg Oral TID  . carbidopa-levodopa  2 tablet Oral TID  . chlorhexidine  15 mL Mouth Rinse BID  . feeding supplement (ENSURE ENLIVE)  237 mL Oral BID BM  . feeding supplement (PRO-STAT SUGAR FREE 64)  30 mL Oral BID  . glycopyrrolate  0.4 mg Intravenous Q4H  . mouth rinse  15 mL Mouth Rinse q12n4p   Continuous Infusions: . sodium chloride 10 mL/hr at 10/16/16 1345     LOS: 15 days    Carlita Whitcomb Jaynie Collins, MD [facility] Pager [phone]  If 7PM-7AM, please contact night-coverage [email or web address] Password [email or web address] 10/18/2016, 2:45 PM

## 2016-10-19 NOTE — Consult Note (Signed)
[place]: Met with family at there request. Discussed [facility] and reviewed chart, pt's condition and the hospice home service agreement. Spoke to our Market researcher. Ryan Cunningham was approved for a bed at [facility]. Family did accept. He will be transferred today to facility. Thank you for the opportunity to work with this family. Cheir Merrilyn Puma RN  [phone]

## 2016-10-19 NOTE — Clinical Social Work Note (Signed)
Patient discharging today to [facility], transported by ambulance. Family at the bedside and aware of discharge and ambulance transport. [facility] representative visited with patient and family at bedside and completed admissions paperwork. CSW signing off as patient discharged, awaiting ambulance transport and no other SW intervention services needed.  Genelle Bal, MSW, LCSW Licensed Clinical Social Worker Clinical Social Work Department [employer] [phone]

## 2016-10-19 NOTE — Care Management Important Message (Signed)
Important Message  Patient Details  Name: VERLE WHEELING MRN: [id number] Date of Birth: 04/07/1927   Medicare Important Message Given:  Yes    Kyla Balzarine 10/19/2016, 9:26 AM

## 2016-10-19 NOTE — Progress Notes (Signed)
Report called to Lupita Leash, RN at hospice. IV in left posterior forearm left intact. Family present when transport arrived. PRN morphine given prior to transport for comfort

## 2016-10-19 NOTE — Progress Notes (Signed)
[facility] Hospital Liaison:  RN   Received request from Alcus Dad, for family interest in [facility].  Chart reviewed.  Unfortunately [facility] is unable to offer a room today.  Erie Noe, LCSW, is aware and will update the family that [facility] liaison will follow up tomorrow or sooner if room becomes available.  Please do not hesitate to call with questions.   Thank you for this referral.  Adele Barthel, RN, BSN [facility] Liaison [phone]  All hospital liaisons are now on AMION.

## 2016-10-19 NOTE — Discharge Summary (Signed)
Physician Discharge Summary  Ryan Cunningham ZOX:[id number] DOB: 06/13/1927 DOA: 10/03/2016  PCP: Jarome Matin, MD  Admit date: 10/03/2016 Discharge date: 10/19/2016  Admitted From:SNF Disposition: residential hospice  Recommendations for Outpatient Follow-up:  1. Follow up with PCP in 1-2 weeks  Home Health: No Equipment/Devices:none Discharge Condition:hospice CODE STATUS:DNR Diet recommendation:as tolerated  Brief/Interim Summary: 83 [place] with a past medical history significant for Parkinson's disease, HTN, and foot ulcerswho presents with lethargy, altered mental status. Found to have urinary obstruction in ER and Foley placed. According to HPI, the patient lives at home with his wife and about two weeks prior to this admission, he became acutely confused, was admitted to the hospital and diagnozed with pneumonia, treated with Levaquin to Ceftin and discharged to SNF 10 days ago where he has been intermittently confused.   In the hospital patient was found to have aspiration pneumonia.  He became unresponsive subsequently. After palliative care meeting 10/3, family elected to transition to comfort care. Discharge to residential hospice. Patient with poor prognosis and no clinical improvement.  Problems list. Acute renal failure likely obstructive uropathy   Parkinson disease (HCC)   Dementia due to Parkinson's disease without behavioral disturbance (HCC)   Essential hypertension   Acute encephalopathy   Foot ulcer (HCC)   Hyperkalemia   Sepsis (HCC) ruled out   Aspiration pneumonia (HCC)   Goals of care, counseling/discussion   Palliative care encounter   Advanced care planning/counseling discussion   Terminal care   Stage 2 right heel pressure injury, poa   Right inner foot with dry callous, poa   Left plantar foot with full thickness wound, poa.  Discharge Diagnoses:  Principal Problem:   Acute renal failure (ARF) (HCC) Active Problems:    Parkinson disease (HCC)   Dementia due to Parkinson's disease without behavioral disturbance (HCC)   Essential hypertension   Acute encephalopathy   Foot ulcer (HCC)   Hyperkalemia   Sepsis (HCC)   Aspiration pneumonia (HCC)   Goals of care, counseling/discussion   Palliative care encounter   Advanced care planning/counseling discussion   Terminal care    Discharge Instructions  Discharge Instructions    Diet general    Complete by:  As directed    As tolerated   Increase activity slowly    Complete by:  As directed      Allergies as of 10/19/2016      Reactions   Penicillins Rash   Has patient had a PCN reaction causing immediate rash, facial/tongue/throat swelling, SOB or lightheadedness with hypotension: Yes Has patient had a PCN reaction causing severe rash involving mucus membranes or skin necrosis: No Has patient had a PCN reaction that required hospitalization: No Has patient had a PCN reaction occurring within the last 10 years: No If all of the above answers are "NO", then may proceed with Cephalosporin use.      Medication List    STOP taking these medications   divalproex 125 MG capsule Commonly known as:  DEPAKOTE SPRINKLE   ICAPS AREDS FORMULA PO   losartan 100 MG tablet Commonly known as:  COZAAR   simvastatin 80 MG tablet Commonly known as:  ZOCOR   SSD 1 % cream Generic drug:  silver sulfADIAZINE     TAKE these medications   acetaminophen 325 MG tablet Commonly known as:  TYLENOL Take 2 tablets (650 mg total) by mouth 3 (three) times daily.   carbidopa-levodopa 10-100 MG tablet Commonly known as:  SINEMET IR Take  2 tablets by mouth 3 (three) times daily. To help tremor.   chlorhexidine 0.12 % solution Commonly known as:  PERIDEX 15 mLs by Mouth Rinse route 2 (two) times daily.   Coverall Boots/Disposable/Univ Misc by Does not apply route. Prevalon Boots---Ensure Boots are on bilateral feet at all times while in the bed    glycopyrrolate 0.2 MG/ML injection Commonly known as:  ROBINUL Inject 0.5 mLs (0.1 mg total) into the vein 3 (three) times daily.   ipratropium 0.03 % nasal spray Commonly known as:  ATROVENT 1 spray as directed. 1 spray into mouth up to three times daily as directed to lessen drooling.   morphine 20 MG/5ML solution Take 0.6 mLs (2.4 mg total) by mouth every 3 (three) hours as needed for pain.      Follow-up Information    Jarome Matin, MD. Schedule an appointment as soon as possible for a visit in 1 week(s).   Specialty:  Internal Medicine Contact information: [address] [place] [address] [phone]          Allergies  Allergen Reactions  . Penicillins Rash    Has patient had a PCN reaction causing immediate rash, facial/tongue/throat swelling, SOB or lightheadedness with hypotension: Yes Has patient had a PCN reaction causing severe rash involving mucus membranes or skin necrosis: No Has patient had a PCN reaction that required hospitalization: No Has patient had a PCN reaction occurring within the last 10 years: No If all of the above answers are "NO", then may proceed with Cephalosporin use.     Consultations: Palliative care  Procedures/Studies: None  Subjective: Seen and examined at bedside. Patient was alert but not following commands. Unable to obtain review of system.  Discharge Exam: Vitals:   10/18/16 2049 10/19/16 0449  BP: 122/61 116/73  Pulse: (!) 58 (!) 52  Resp: 19 20  Temp: 98.7 F (37.1 C) 98.2 F (36.8 C)  SpO2: 100% 100%   Vitals:   10/18/16 0555 10/18/16 1700 10/18/16 2049 10/19/16 0449  BP: 118/72 116/74 122/61 116/73  Pulse: (!) 55 (!) 45 (!) 58 (!) 52  Resp: Temp: 98 F (36.7 C) 98.2 F (36.8 C) 98.7 F (37.1 C) 98.2 F (36.8 C)  TempSrc: Axillary Axillary Axillary Axillary  SpO2: 99% 100% 100% 100%  Weight:   78.2 kg (172 lb 6.4 oz)   Height:        General: Elderly ill-looking male lying  in bed, alert but not following commands Cardiovascular: RRR, S1/S2 +, no rubs, no gallops Respiratory: CTA bilaterally, no wheezing, no rhonchi Abdominal: Soft, NT, ND, bowel sounds + Extremities: no edema, no cyanosis    The results of significant diagnostics from this hospitalization (including imaging, microbiology, ancillary and laboratory) are listed below for reference.     Microbiology: No results found for this or any previous visit (from the past 240 hour(s)).   Labs: BNP (last 3 results)  Recent Labs  09/18/16 1909  BNP 651.8*   Basic Metabolic Panel:  Recent Labs Lab 10/13/16 1748 10/15/16 0721 10/16/16 0300  NA 138 139 140  K 4.0 4.1 3.6  CL 107 109 111  CO2 23 22 19*  GLUCOSE 82 58* 57*  BUN CREATININE 0.85 0.79 0.82  CALCIUM 7.7* 7.5* 7.4*   Liver Function Tests: No results for input(s): AST, ALT, ALKPHOS, BILITOT, PROT, ALBUMIN in the last 168 hours. No results for input(s): LIPASE, AMYLASE in the last 168 hours.  No results for input(s): AMMONIA in the last 168 hours. CBC:  Recent Labs Lab 10/13/16 1748 10/15/16 0942 10/16/16 0300  WBC 12.0* 11.4* 9.1  HGB 11.6* 11.3* 10.6*  HCT 36.2* 35.4* 33.7*  MCV 87.0 87.8 88.9  PLT 361 332 286   Cardiac Enzymes: No results for input(s): CKTOTAL, CKMB, CKMBINDEX, TROPONINI in the last 168 hours. BNP: Invalid input(s): POCBNP CBG: No results for input(s): GLUCAP in the last 168 hours. D-Dimer No results for input(s): DDIMER in the last 72 hours. Hgb A1c No results for input(s): HGBA1C in the last 72 hours. Lipid Profile No results for input(s): CHOL, HDL, LDLCALC, TRIG, CHOLHDL, LDLDIRECT in the last 72 hours. Thyroid function studies No results for input(s): TSH, T4TOTAL, T3FREE, THYROIDAB in the last 72 hours.  Invalid input(s): FREET3 Anemia work up No results for input(s): VITAMINB12, FOLATE, FERRITIN, TIBC, IRON, RETICCTPCT in the last 72 hours. Urinalysis    Component Value  Date/Time   COLORURINE YELLOW 10/03/2016 2020   APPEARANCEUR CLEAR 10/03/2016 2020   LABSPEC 1.014 10/03/2016 2020   PHURINE 5.0 10/03/2016 2020   GLUCOSEU NEGATIVE 10/03/2016 2020   HGBUR MODERATE (A) 10/03/2016 2020   BILIRUBINUR NEGATIVE 10/03/2016 2020   KETONESUR NEGATIVE 10/03/2016 2020   PROTEINUR NEGATIVE 10/03/2016 2020   UROBILINOGEN 1.0 03/21/2010 2342   NITRITE NEGATIVE 10/03/2016 2020   LEUKOCYTESUR NEGATIVE 10/03/2016 2020   Sepsis Labs Invalid input(s): PROCALCITONIN,  WBC,  LACTICIDVEN Microbiology No results found for this or any previous visit (from the past 240 hour(s)).   Time coordinating discharge: 28 minutes  SIGNED:   Maxie Barb, MD  [facility] 10/19/2016, 12:19 PM  If 7PM-7AM, please contact night-coverage [email or web address] Password [email or web address]

## 2016-11-09 DEATH — deceased

## 2019-01-04 IMAGING — US US RENAL
1 series · 14 of 25 positions shown · non-contrast
Comparison: None.

CLINICAL DATA: 89-year-old with acute renal failure.

EXAM:
RENAL / URINARY TRACT ULTRASOUND COMPLETE

[Series 1: us renal · 0.33mm/px · 14 of 26 slices shown]
[im 1/26]
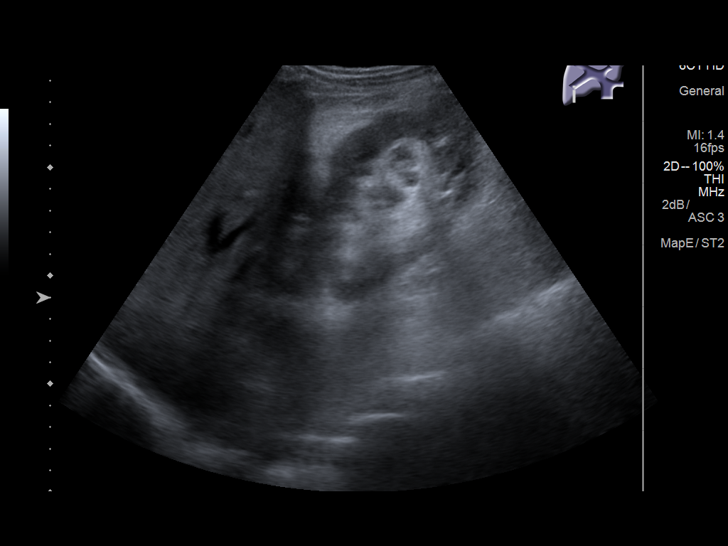
[im 3/26]
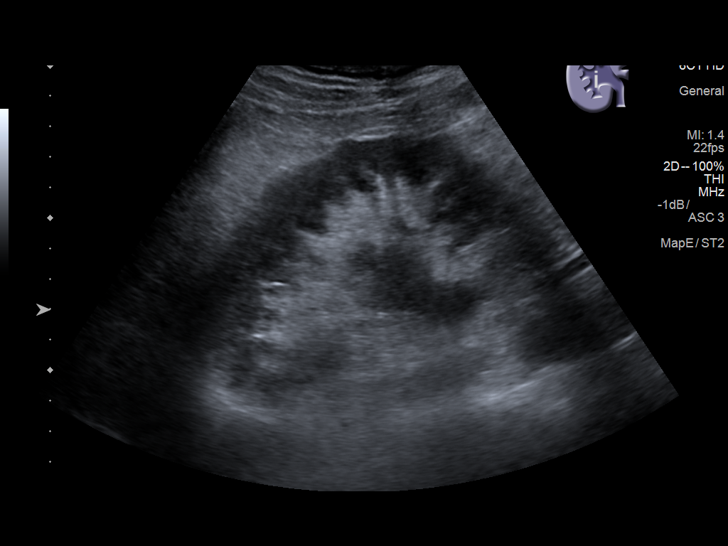
[im 5/26]
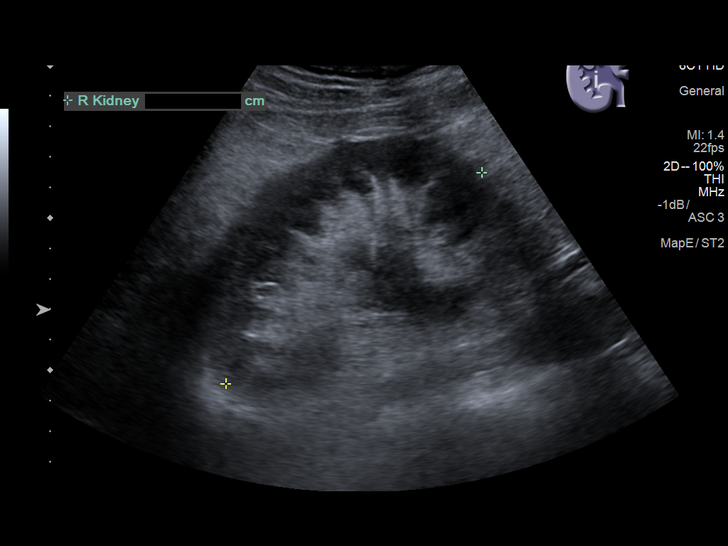
[im 7/26]
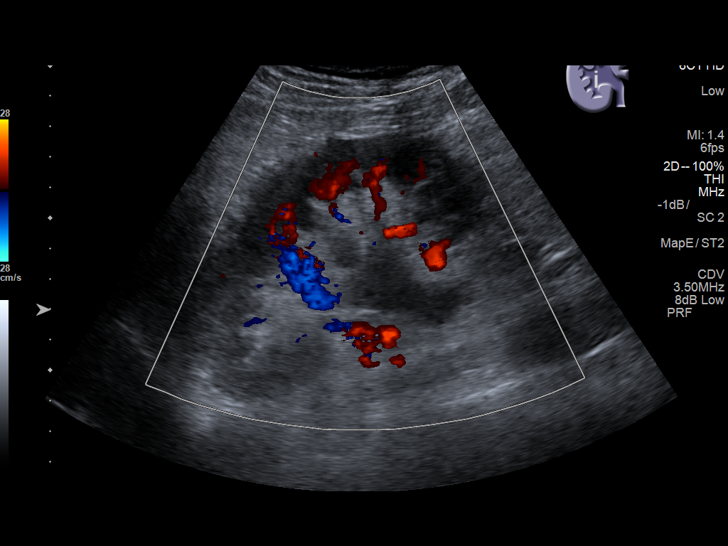
[im 9/26]
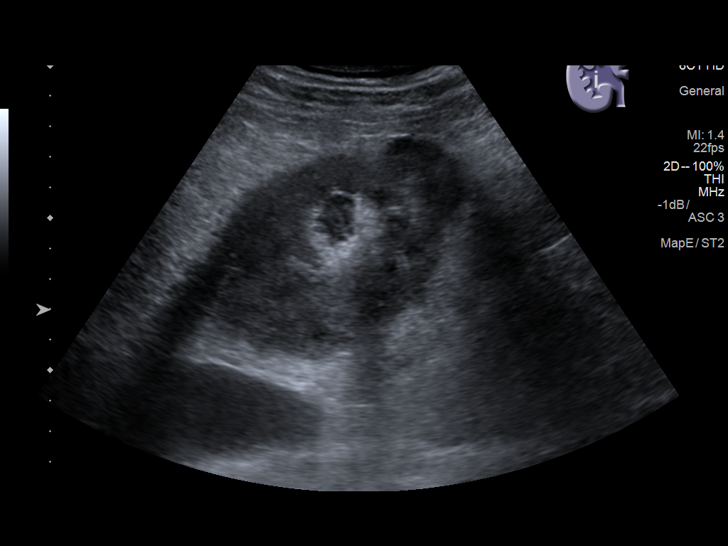
[im 10/26]
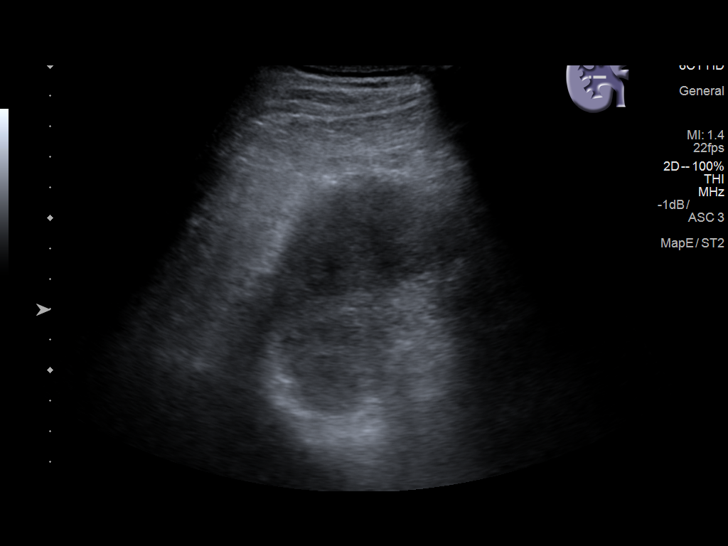
[im 12/26]
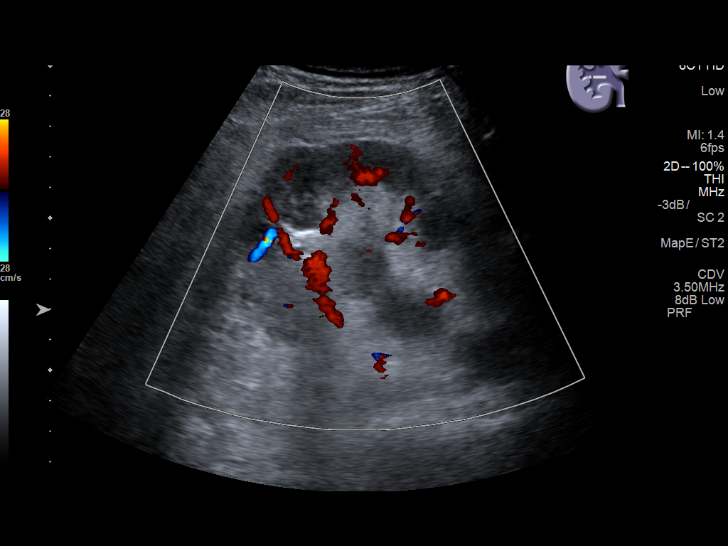
[im 14/26]
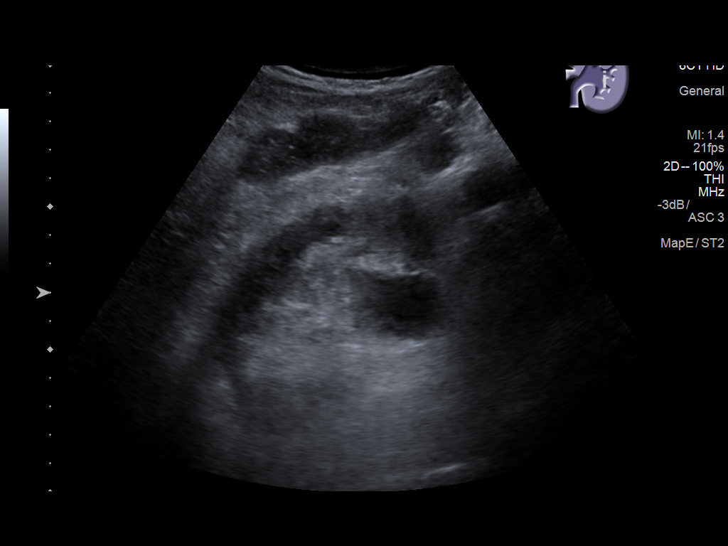
[im 16/26]
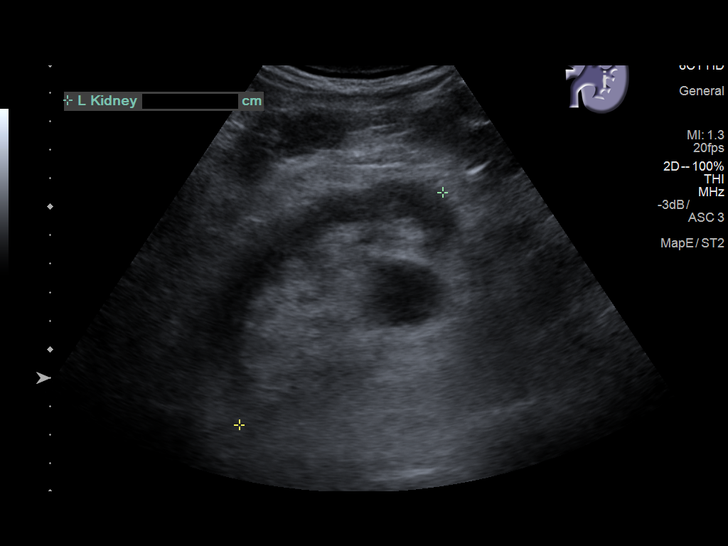
[im 17/26]
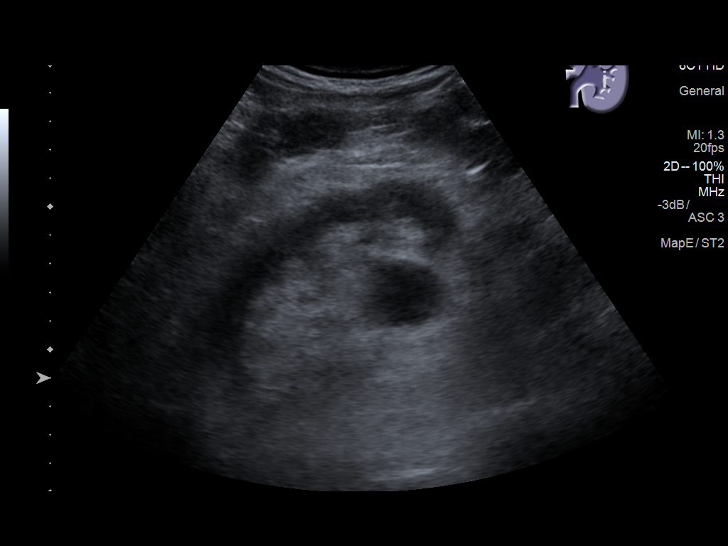
[im 19/26]
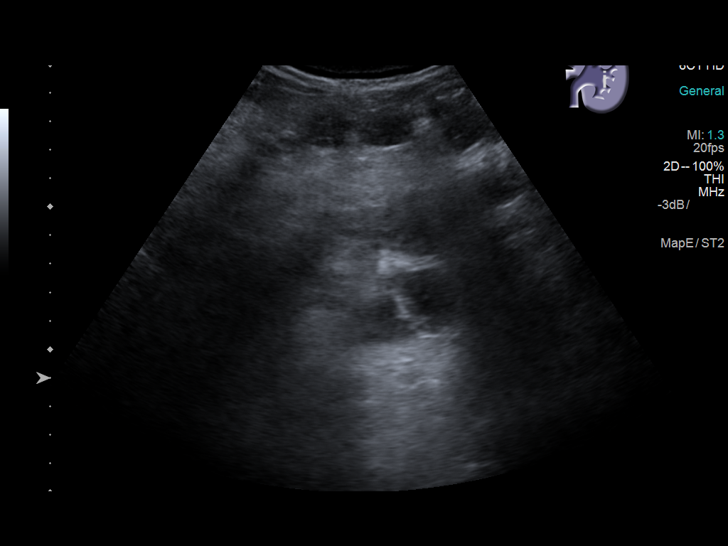
[im 21/26]
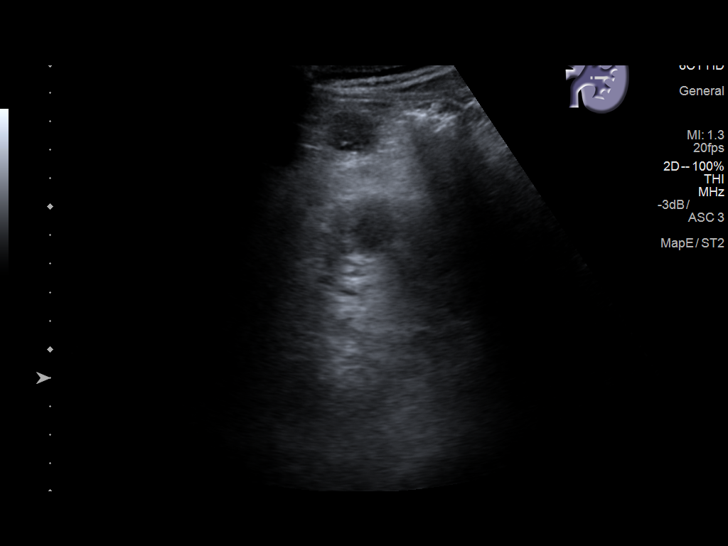
[im 23/26]
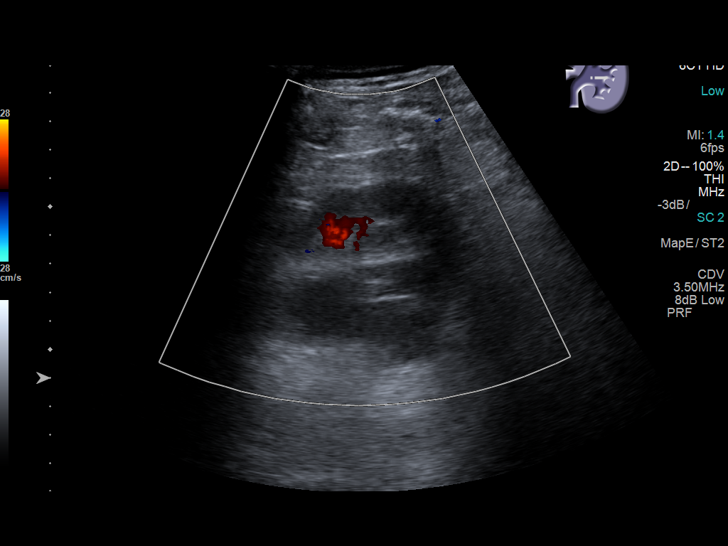
[im 26/26]
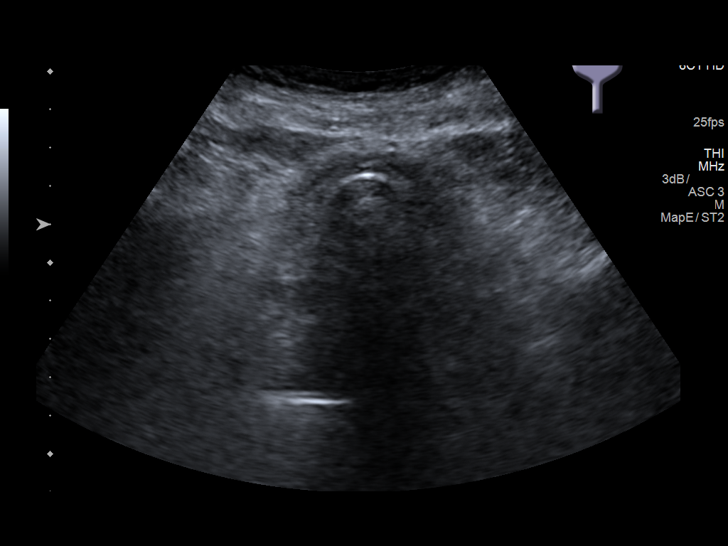

[14 of 25 positions shown; findings below may reference images not displayed]

FINDINGS: Right Kidney:

Length: 10.9 cm. Mild to moderate hydronephrosis. Mild thinning of
the renal parenchyma. No focal renal mass.

Left Kidney:

Length: 1.8 cm. Mild to moderate hydronephrosis. Mild thinning of
the renal parenchyma. No focal renal mass.

Bladder:

Decompressed by Foley catheter.
IMPRESSION: Mild to moderate bilateral hydronephrosis. Urinary bladder is
decompressed by Foley catheter.

## 2019-01-06 IMAGING — US US ABDOMEN LIMITED
1 series · 14 of 25 positions shown · non-contrast
Comparison: 10/04/2016 CT

CLINICAL DATA: Right upper quadrant pain today.

EXAM:
ULTRASOUND ABDOMEN LIMITED RIGHT UPPER QUADRANT

[Series 1: us abdomen limited · 0.20mm/px · 54 acquisitions, 14 frames shown]
[im 1/54]
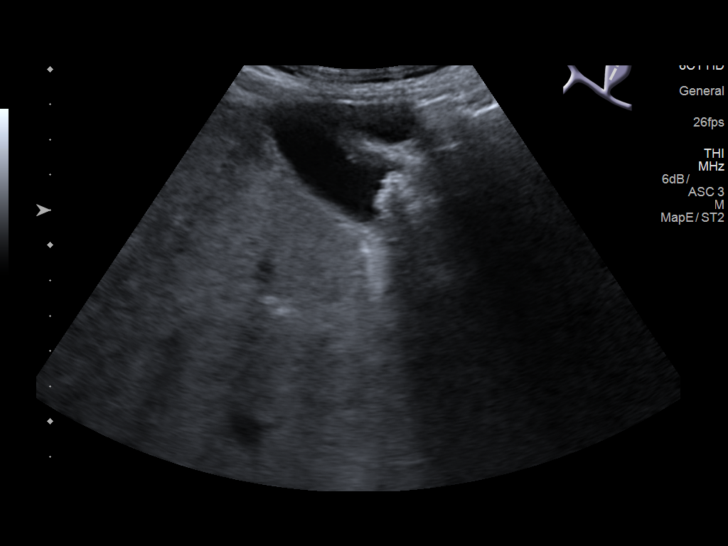
[im 5/54]
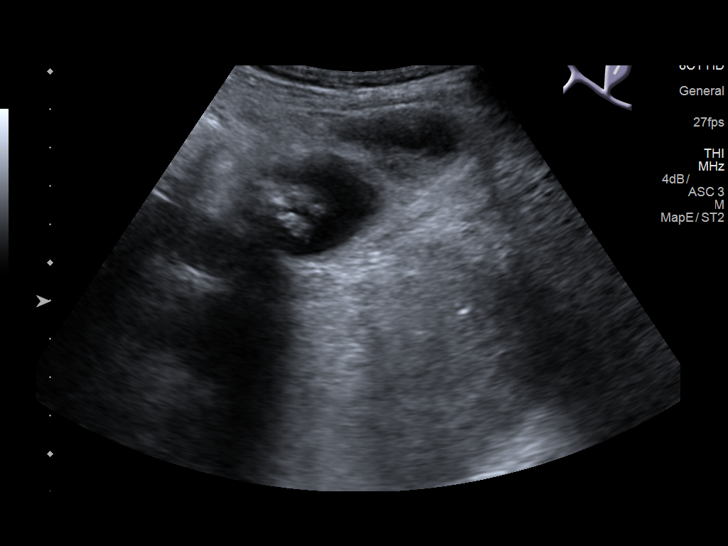
[im 9/54]
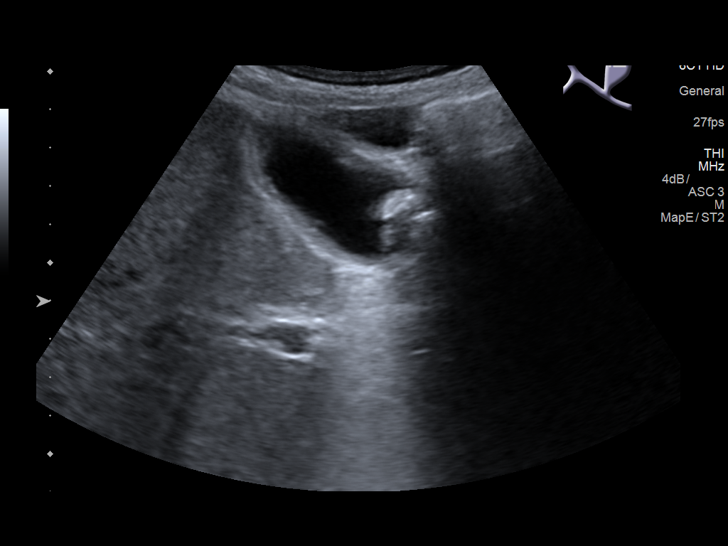
[im 14/54]
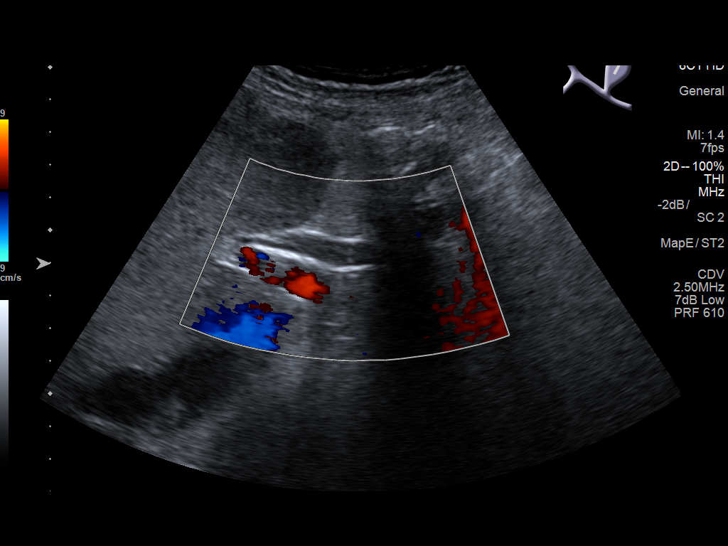
[im 18/54]
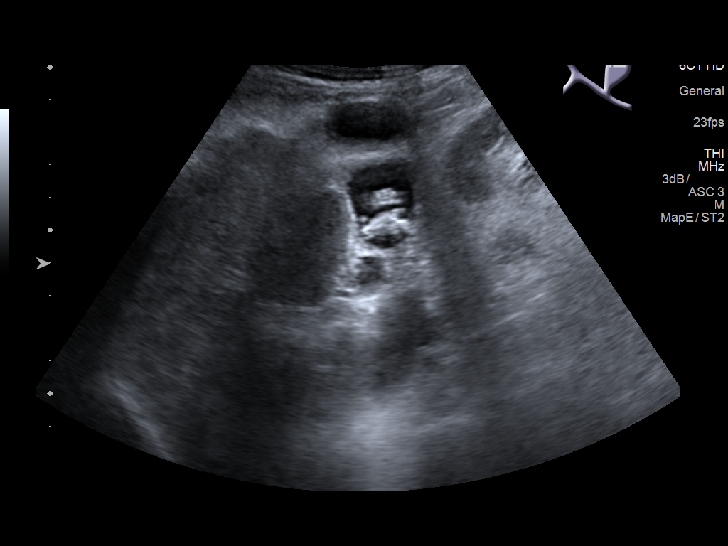
[im 20/54]
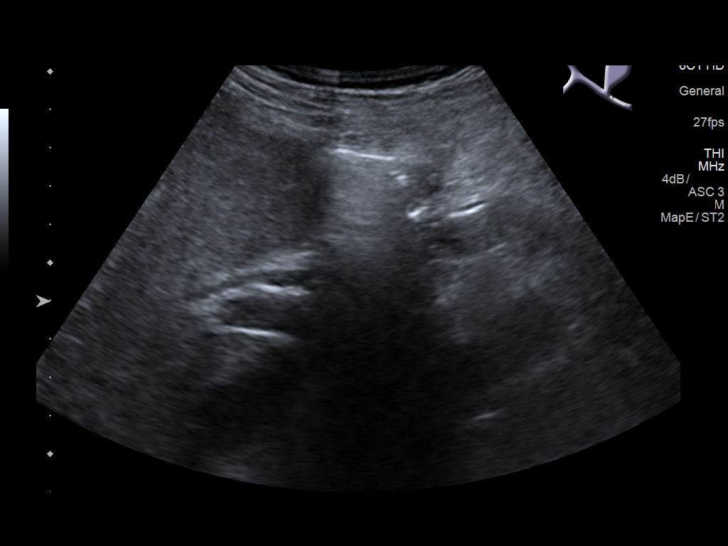
[im 25/54]
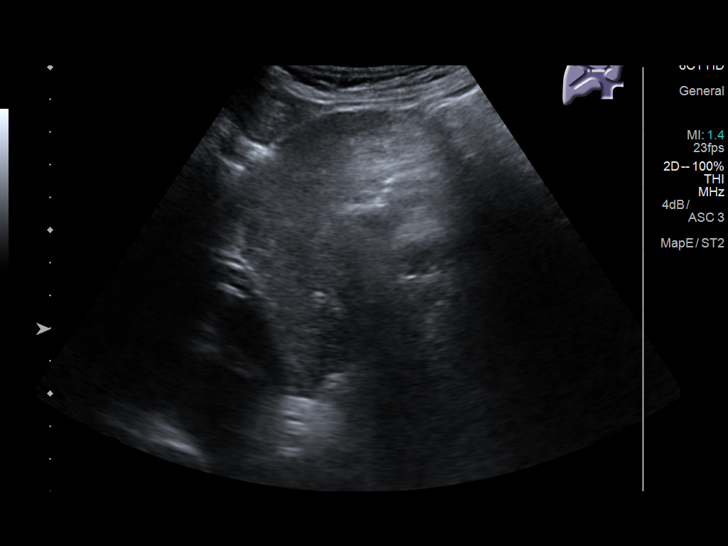
[im 29/54]
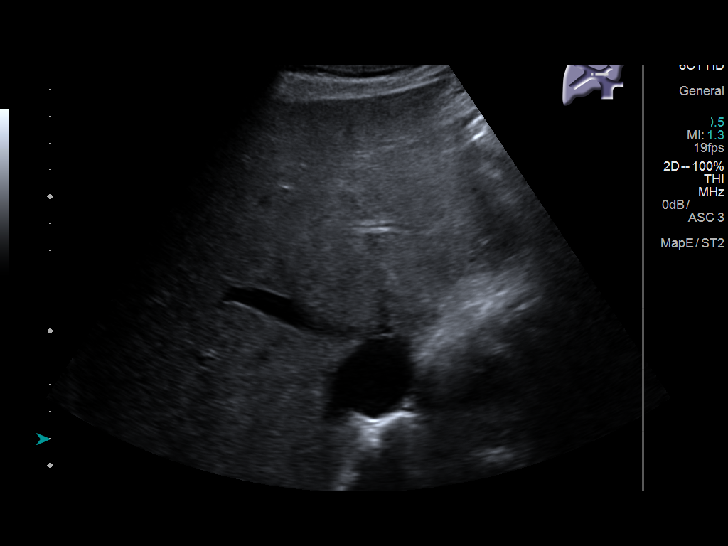
[im 34/54]
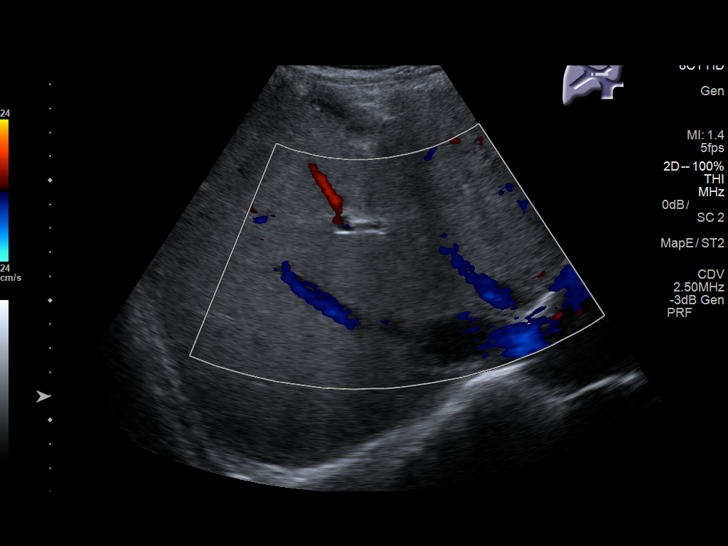
[im 36/54]
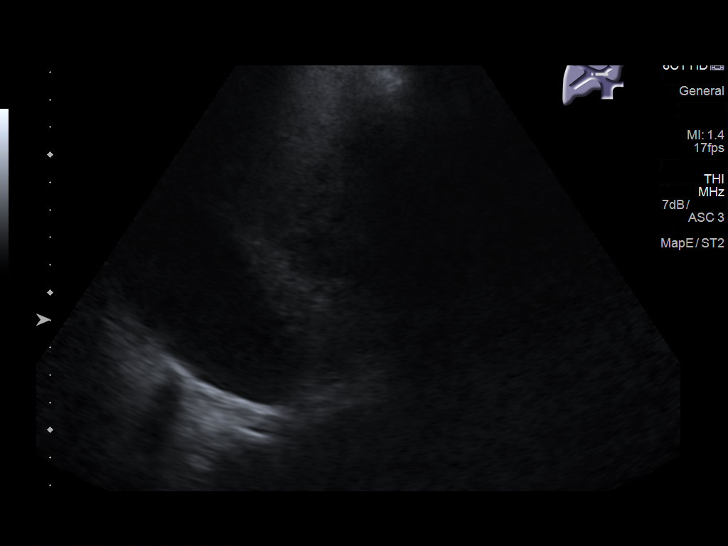
[im 40/54]
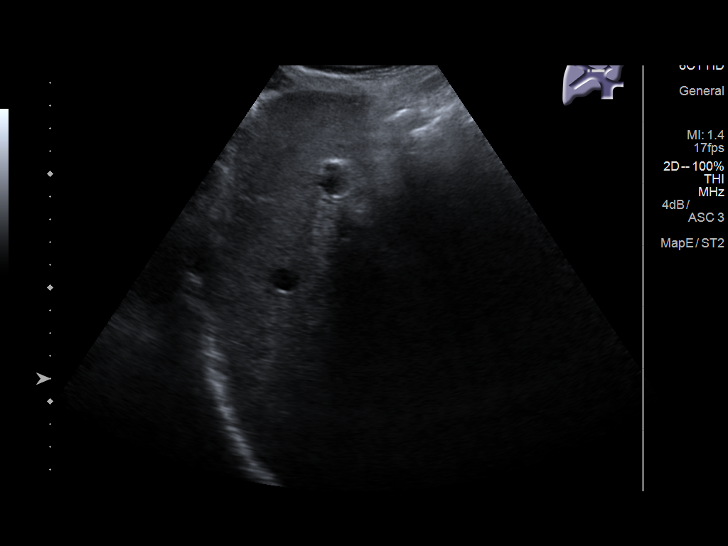
[im 45/54]
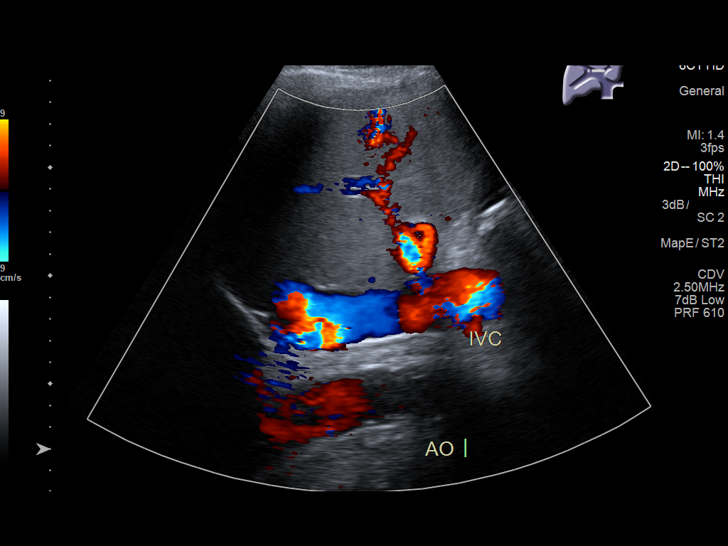
[im 49/54]
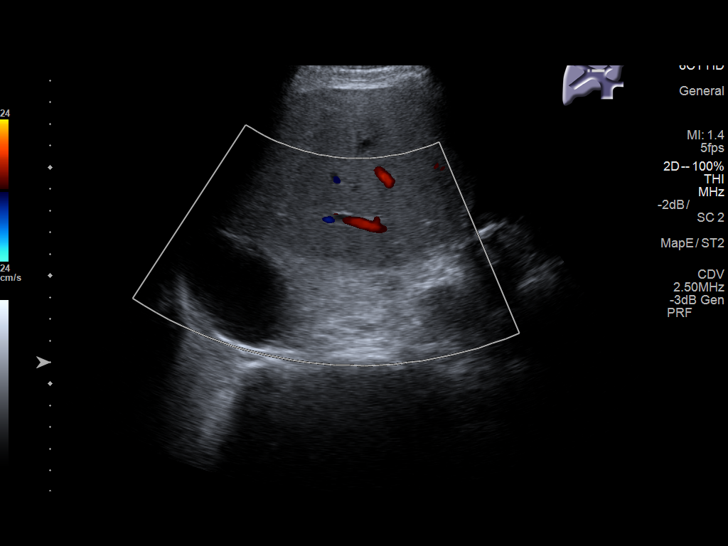
[im 54/54]
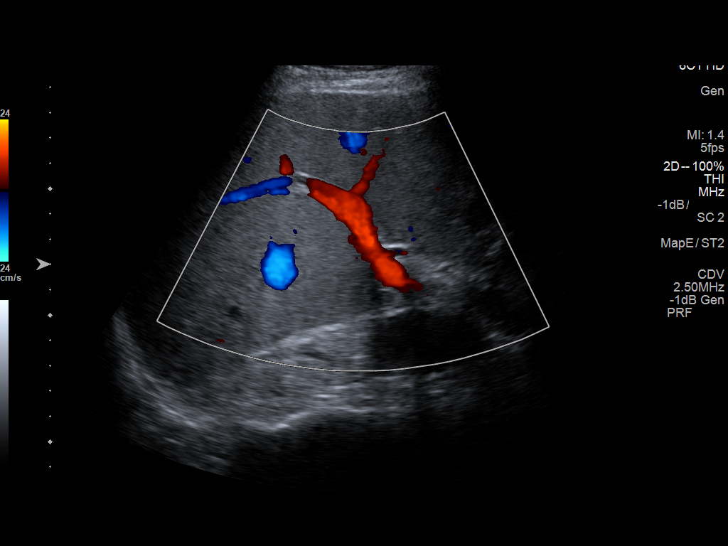

[14 of 25 positions shown; findings below may reference images not displayed]

FINDINGS: Gallbladder:

A 1.8 cm calcified gallstone is noted without secondary signs of
acute cholecystitis. No sonographic Murphy sign. Single wall
thickness was normal at 2.7 mm.

Common bile duct:

Diameter: 4.1 mm

Liver:

No focal lesion identified. Within normal limits in parenchymal
echogenicity. Portal vein is patent on color Doppler imaging with
normal direction of blood flow towards the liver.

Incidental suprarenal cyst is noted measuring 11.4 x 4.4 x 4.8 cm
sonographically.
IMPRESSION: 1. Uncomplicated cholelithiasis. No secondary signs of acute
cholecystitis.
2. Retroperitoneal cyst on the right above the upper pole of the
kidney measuring 11.4 x 4.4 x 4.8 cm.

## 2019-07-17 IMAGING — CR DG CHEST 1V PORT
1 series · 1 of 1 positions shown · non-contrast
Comparison: 10/05/2016.

CLINICAL DATA: Cough.  Fever.

EXAM:
PORTABLE CHEST 1 VIEW

[AP]
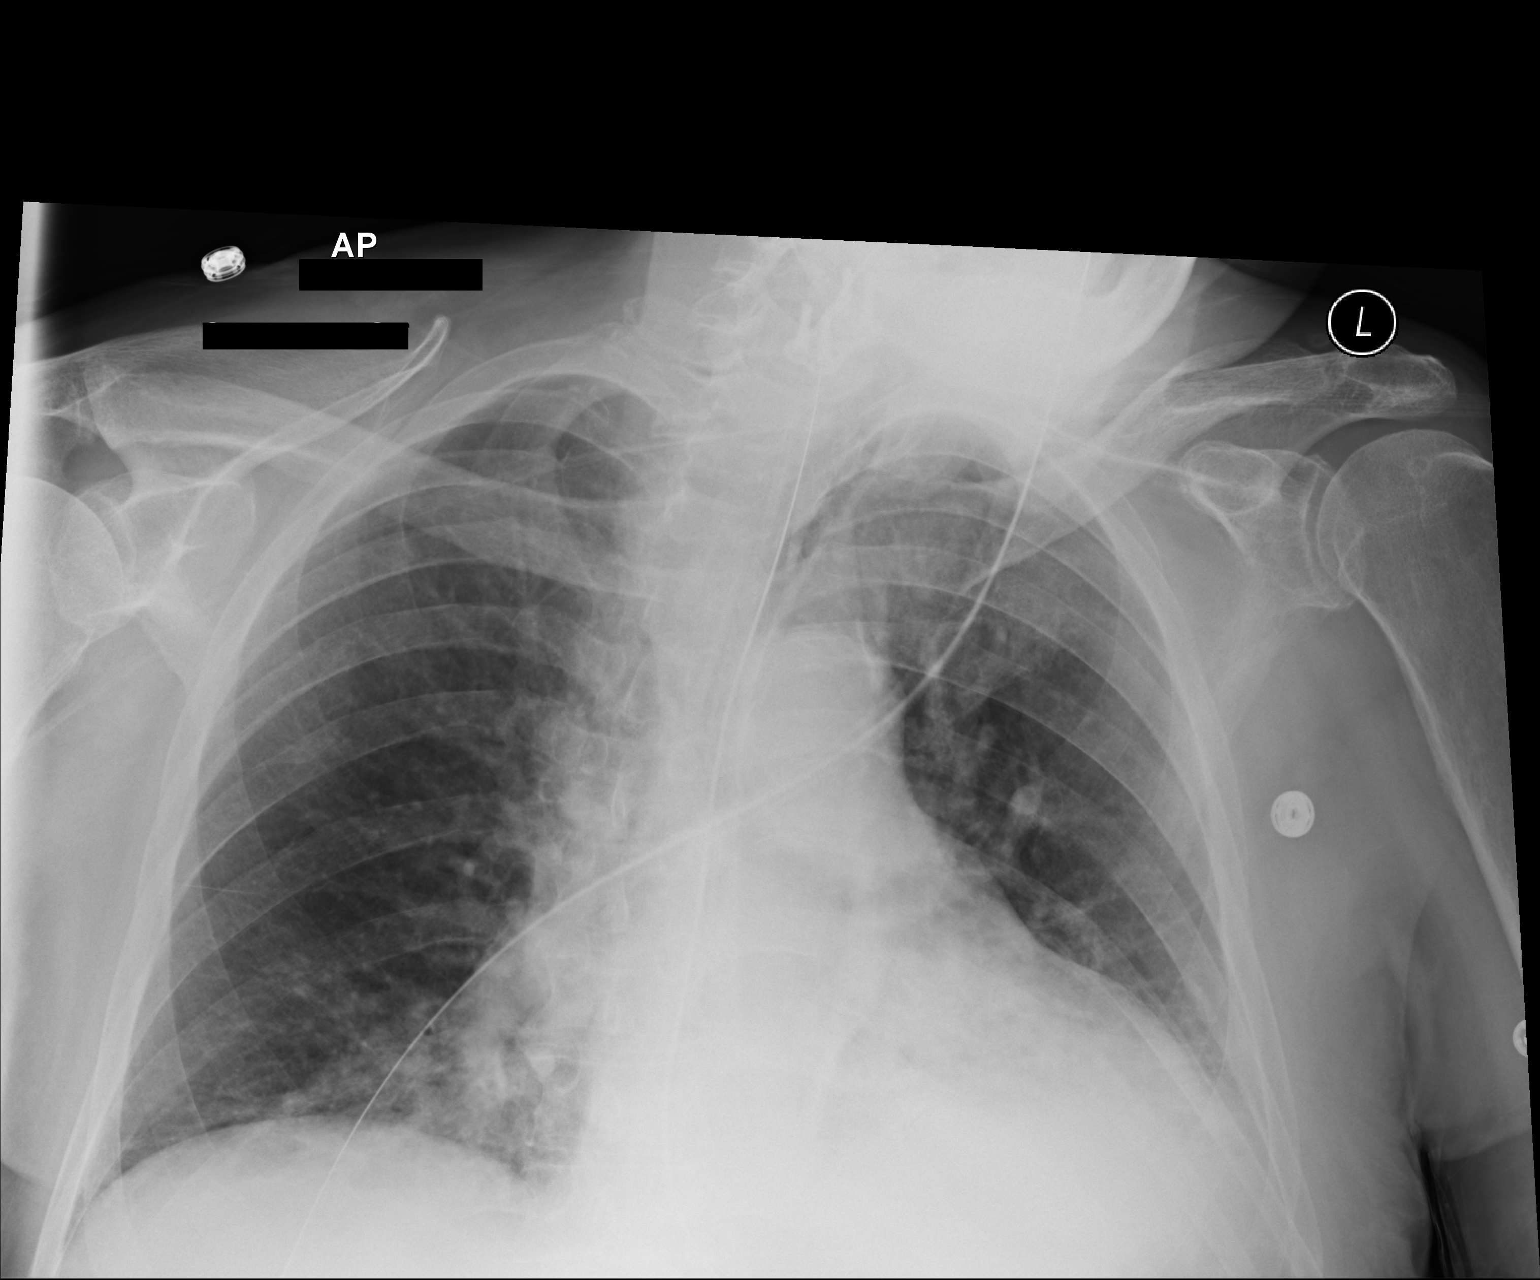

[1 of 1 positions shown; findings below may reference images not displayed]

FINDINGS: Stable enlarged cardiac silhouette. Interval patchy density at the
left lung base with underlying linear density noted. Small amount of
interval linear density at the right lung base. Nasogastric tube
extending into the stomach. Unremarkable bones.
IMPRESSION: 1. Interval patchy density at the left lung base suspicious for
pneumonia.
2. Bibasilar linear atelectasis.
3. Stable mild cardiomegaly.
